# Patient Record
Sex: Female | Born: 1980
Health system: Southern US, Community
[De-identification: ages and names within clinical notes are randomized; demographics above are authoritative.]

## PROBLEM LIST (undated history)

## (undated) DIAGNOSIS — O24419 Gestational diabetes mellitus in pregnancy, unspecified control: Secondary | ICD-10-CM

## (undated) DIAGNOSIS — K589 Irritable bowel syndrome without diarrhea: Secondary | ICD-10-CM

## (undated) DIAGNOSIS — F419 Anxiety disorder, unspecified: Secondary | ICD-10-CM

## (undated) DIAGNOSIS — R87629 Unspecified abnormal cytological findings in specimens from vagina: Secondary | ICD-10-CM

## (undated) DIAGNOSIS — O43129 Velamentous insertion of umbilical cord, unspecified trimester: Secondary | ICD-10-CM

## (undated) DIAGNOSIS — J45909 Unspecified asthma, uncomplicated: Secondary | ICD-10-CM

## (undated) DIAGNOSIS — O26649 Intrahepatic cholestasis of pregnancy, unspecified trimester: Secondary | ICD-10-CM

## (undated) DIAGNOSIS — K831 Obstruction of bile duct: Secondary | ICD-10-CM

## (undated) DIAGNOSIS — F329 Major depressive disorder, single episode, unspecified: Secondary | ICD-10-CM

## (undated) DIAGNOSIS — F32A Depression, unspecified: Secondary | ICD-10-CM

## (undated) DIAGNOSIS — O26619 Liver and biliary tract disorders in pregnancy, unspecified trimester: Secondary | ICD-10-CM

## (undated) HISTORY — DX: Intrahepatic cholestasis of pregnancy, unspecified trimester: O26.649

## (undated) HISTORY — DX: Obstruction of bile duct: K83.1

## (undated) HISTORY — DX: Liver and biliary tract disorders in pregnancy, unspecified trimester: O26.619

## (undated) HISTORY — DX: Unspecified abnormal cytological findings in specimens from vagina: R87.629

## (undated) HISTORY — DX: Depression, unspecified: F32.A

## (undated) HISTORY — PX: ABDOMINOPLASTY: SUR9

## (undated) HISTORY — DX: Anxiety disorder, unspecified: F41.9

## (undated) HISTORY — DX: Irritable bowel syndrome, unspecified: K58.9

## (undated) HISTORY — DX: Major depressive disorder, single episode, unspecified: F32.9

## (undated) HISTORY — DX: Unspecified asthma, uncomplicated: J45.909

---

## 2014-01-09 ENCOUNTER — Emergency Department (HOSPITAL_BASED_OUTPATIENT_CLINIC_OR_DEPARTMENT_OTHER): Payer: BC Managed Care – PPO

## 2014-01-09 ENCOUNTER — Emergency Department (HOSPITAL_BASED_OUTPATIENT_CLINIC_OR_DEPARTMENT_OTHER)
Admission: EM | Admit: 2014-01-09 | Discharge: 2014-01-09 | Disposition: A | Discharge status: Home / Self Care | Payer: BC Managed Care – PPO | Attending: Emergency Medicine | Admitting: Emergency Medicine

## 2014-01-09 ENCOUNTER — Encounter (HOSPITAL_BASED_OUTPATIENT_CLINIC_OR_DEPARTMENT_OTHER): Payer: Self-pay | Admitting: Emergency Medicine

## 2014-01-09 DIAGNOSIS — Y92410 Unspecified street and highway as the place of occurrence of the external cause: Secondary | ICD-10-CM | POA: Insufficient documentation

## 2014-01-09 DIAGNOSIS — S99929A Unspecified injury of unspecified foot, initial encounter: Secondary | ICD-10-CM

## 2014-01-09 DIAGNOSIS — Z880 Allergy status to penicillin: Secondary | ICD-10-CM | POA: Insufficient documentation

## 2014-01-09 DIAGNOSIS — S99919A Unspecified injury of unspecified ankle, initial encounter: Secondary | ICD-10-CM

## 2014-01-09 DIAGNOSIS — S8990XA Unspecified injury of unspecified lower leg, initial encounter: Secondary | ICD-10-CM | POA: Insufficient documentation

## 2014-01-09 DIAGNOSIS — S199XXA Unspecified injury of neck, initial encounter: Principal | ICD-10-CM

## 2014-01-09 DIAGNOSIS — Z3202 Encounter for pregnancy test, result negative: Secondary | ICD-10-CM | POA: Insufficient documentation

## 2014-01-09 DIAGNOSIS — S3981XA Other specified injuries of abdomen, initial encounter: Secondary | ICD-10-CM | POA: Insufficient documentation

## 2014-01-09 DIAGNOSIS — S0993XA Unspecified injury of face, initial encounter: Secondary | ICD-10-CM | POA: Insufficient documentation

## 2014-01-09 DIAGNOSIS — Y9389 Activity, other specified: Secondary | ICD-10-CM | POA: Insufficient documentation

## 2014-01-09 LAB — PREGNANCY, URINE: PREG TEST UR: NEGATIVE

## 2014-01-09 MED ORDER — IBUPROFEN 600 MG PO TABS: 600.0000 mg | ORAL_TABLET | Freq: Three times a day (TID) | ORAL | Status: AC

## 2014-01-09 MED ORDER — IBUPROFEN 800 MG PO TABS
800.0000 mg | ORAL_TABLET | Freq: Once | ORAL | Status: AC
  Administered 2014-01-09: 800 mg via ORAL
  Filled 2014-01-09: qty 1

## 2014-01-09 MED ORDER — DIAZEPAM 5 MG PO TABS: 5.0000 mg | ORAL_TABLET | Freq: Two times a day (BID) | ORAL | Status: DC
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
## 2014-02-12 LAB — OB RESULTS CONSOLE ABO/RH: RH Type: POSITIVE

## 2014-02-12 LAB — OB RESULTS CONSOLE GC/CHLAMYDIA
Chlamydia: NEGATIVE
Gonorrhea: NEGATIVE

## 2014-02-12 LAB — OB RESULTS CONSOLE ANTIBODY SCREEN: Antibody Screen: NEGATIVE

## 2014-02-12 LAB — OB RESULTS CONSOLE RPR: RPR: NONREACTIVE

## 2014-02-12 LAB — OB RESULTS CONSOLE HEPATITIS B SURFACE ANTIGEN: HEP B S AG: NEGATIVE

## 2014-02-12 LAB — OB RESULTS CONSOLE HIV ANTIBODY (ROUTINE TESTING): HIV: NONREACTIVE

## 2014-02-12 LAB — OB RESULTS CONSOLE RUBELLA ANTIBODY, IGM: Rubella: IMMUNE

## 2014-07-13 ENCOUNTER — Other Ambulatory Visit: Payer: Self-pay

## 2014-07-22 ENCOUNTER — Other Ambulatory Visit (HOSPITAL_COMMUNITY): Payer: Self-pay | Admitting: Obstetrics & Gynecology

## 2014-07-22 DIAGNOSIS — K831 Obstruction of bile duct: Secondary | ICD-10-CM

## 2014-07-22 DIAGNOSIS — O26612 Liver and biliary tract disorders in pregnancy, second trimester: Principal | ICD-10-CM

## 2014-07-30 ENCOUNTER — Ambulatory Visit (HOSPITAL_COMMUNITY)
Admission: RE | Admit: 2014-07-30 | Discharge: 2014-07-30 | Disposition: A | Discharge status: Home / Self Care | Payer: BC Managed Care – PPO | Source: Ambulatory Visit | Attending: Obstetrics & Gynecology | Admitting: Obstetrics & Gynecology

## 2014-07-30 ENCOUNTER — Encounter (HOSPITAL_COMMUNITY): Payer: Self-pay

## 2014-07-30 VITALS — BP 134/78 | HR 101 | Wt 186.2 lb

## 2014-07-30 DIAGNOSIS — K831 Obstruction of bile duct: Secondary | ICD-10-CM

## 2014-07-30 DIAGNOSIS — K838 Other specified diseases of biliary tract: Secondary | ICD-10-CM | POA: Insufficient documentation

## 2014-07-30 DIAGNOSIS — O26612 Liver and biliary tract disorders in pregnancy, second trimester: Secondary | ICD-10-CM

## 2014-07-30 DIAGNOSIS — O26613 Liver and biliary tract disorders in pregnancy, third trimester: Principal | ICD-10-CM

## 2014-07-30 DIAGNOSIS — O26643 Intrahepatic cholestasis of pregnancy, third trimester: Secondary | ICD-10-CM

## 2014-07-30 DIAGNOSIS — O26619 Liver and biliary tract disorders in pregnancy, unspecified trimester: Secondary | ICD-10-CM | POA: Insufficient documentation

## 2014-07-30 NOTE — Consult Note (Signed)
Maternal Fetal Medicine Consultation  Requesting Provider(s): Ema Alger SimonsWakuru Kulwa, MD  Primary OB: Reason for consultation: Cholestasis of pregnancy  HPI: Holly Schwabnn Jackson is a 33 yo G1P0 currently at 31w 6d who was seen for consultation due to a recent diagnosis of cholestasis of pregnancy.  The patient reports that she developed pruritis on the soles and palms of her feet that progressed to diffuse pruritis in early June.  Her total bile acids returned at 10 and a diagnosis of cholestasis of pregnancy was made.  She is currently on Ursodiol 500 mg BID and Phenergan.  She reports that her pruritis has improved with medications.  She is currently undergoing weekly BPPs.  She is otherwise without complaints.  OB History: OB History   Grav Para Term Preterm Abortions TAB SAB Ect Mult Living   1               PMH: neg  PSH: Abdominoplasty 2008  Meds: Ursodiol 500 mg BID, Phenergan 25 mg qhS prn, PNV  Allergies:  Allergies  Allergen Reactions  . Penicillins Shortness Of Breath  . Sulfa Antibiotics Rash   FH: denies birth defects or hereditary disorders  Soc: denies tobacco or ETOH use   Review of Systems: no vaginal bleeding or cramping/contractions, no LOF, no nausea/vomiting. All other systems reviewed and are negative.  PE:   Filed Vitals:   07/30/14 1347  BP: 134/78  Pulse: 101   A/P: 1) Single IUP at 31w 6d         2) Cholestasis of pregnancy - we had a brief discussion regarding this diagnosis.  While the symptoms are restricted to pregnancy, there is a high risk of recurrence in future pregnancies.  Cholestasis of pregnancy is associated with an increased risk of stillbirth.  Recommend: 1) Continue antepartum fetal testing as scheduled.  In general, would recommend 2x weekly NSTs with weekly AFIs after [redacted] weeks gestation. 2) If a recent ultrasound for growth has not been performed, would recommend that this be done at the next available opportunity. 3) Please check LFTs at  next clinic visit (cholestasis may be associated with elevated LFTs - this may be helpful to have an idea of the patient's baseline function).   4) Recommend delivery at [redacted] weeks gestation due to increased risk of IUFD with cholestasis or pregnancy.   Thank you for the opportunity to be a part of the care of Holly Jackson. Please contact our office if we can be of further assistance.   I spent approximately 30 minutes with this patient with over 50% of time spent in face-to-face counseling.  Alpha GulaPaul Sokhna Christoph, MD Maternal Fetal Medicine

## 2014-08-27 ENCOUNTER — Ambulatory Visit (HOSPITAL_COMMUNITY)
Admission: RE | Admit: 2014-08-27 | Discharge: 2014-08-27 | Disposition: A | Discharge status: Home / Self Care | Payer: BC Managed Care – PPO | Source: Ambulatory Visit | Attending: Obstetrics and Gynecology | Admitting: Obstetrics and Gynecology

## 2014-08-27 ENCOUNTER — Other Ambulatory Visit: Payer: Self-pay | Admitting: Obstetrics and Gynecology

## 2014-08-27 DIAGNOSIS — O26619 Liver and biliary tract disorders in pregnancy, unspecified trimester: Secondary | ICD-10-CM

## 2014-08-27 NOTE — Progress Notes (Signed)
Patient no showed for 9:00AM add on appt. Dr. Su Hilt was called to inform of patient no show status. KK 2:55PM

## 2014-09-14 ENCOUNTER — Telehealth (HOSPITAL_COMMUNITY): Payer: Self-pay | Admitting: *Deleted

## 2014-09-14 ENCOUNTER — Encounter (HOSPITAL_COMMUNITY): Payer: Self-pay | Admitting: *Deleted

## 2014-09-14 LAB — OB RESULTS CONSOLE GBS: STREP GROUP B AG: POSITIVE

## 2014-09-14 NOTE — Telephone Encounter (Signed)
Preadmission screen  

## 2014-09-17 DIAGNOSIS — F411 Generalized anxiety disorder: Secondary | ICD-10-CM | POA: Insufficient documentation

## 2014-09-17 DIAGNOSIS — E669 Obesity, unspecified: Secondary | ICD-10-CM | POA: Diagnosis present

## 2014-09-17 DIAGNOSIS — Z9889 Other specified postprocedural states: Secondary | ICD-10-CM | POA: Insufficient documentation

## 2014-09-17 DIAGNOSIS — K831 Obstruction of bile duct: Secondary | ICD-10-CM | POA: Diagnosis present

## 2014-09-17 DIAGNOSIS — O26619 Liver and biliary tract disorders in pregnancy, unspecified trimester: Secondary | ICD-10-CM

## 2014-09-17 DIAGNOSIS — O99820 Streptococcus B carrier state complicating pregnancy: Secondary | ICD-10-CM

## 2014-09-20 ENCOUNTER — Inpatient Hospital Stay (HOSPITAL_COMMUNITY)
Admission: RE | Admit: 2014-09-20 | Discharge: 2014-09-24 | DRG: 765 | Disposition: A | Discharge status: Home / Self Care | Payer: BC Managed Care – PPO | Source: Ambulatory Visit | Attending: Obstetrics and Gynecology | Admitting: Obstetrics and Gynecology

## 2014-09-20 ENCOUNTER — Encounter (HOSPITAL_COMMUNITY): Payer: Self-pay

## 2014-09-20 VITALS — BP 107/70 | HR 97 | Temp 98.3°F | Resp 18 | Ht 62.0 in | Wt 190.0 lb

## 2014-09-20 DIAGNOSIS — O99824 Streptococcus B carrier state complicating childbirth: Secondary | ICD-10-CM | POA: Diagnosis present

## 2014-09-20 DIAGNOSIS — F411 Generalized anxiety disorder: Secondary | ICD-10-CM | POA: Diagnosis present

## 2014-09-20 DIAGNOSIS — O2662 Liver and biliary tract disorders in childbirth: Secondary | ICD-10-CM | POA: Diagnosis present

## 2014-09-20 DIAGNOSIS — D6489 Other specified anemias: Secondary | ICD-10-CM | POA: Diagnosis present

## 2014-09-20 DIAGNOSIS — K831 Obstruction of bile duct: Secondary | ICD-10-CM | POA: Diagnosis present

## 2014-09-20 DIAGNOSIS — O34219 Maternal care for unspecified type scar from previous cesarean delivery: Secondary | ICD-10-CM

## 2014-09-20 DIAGNOSIS — O99820 Streptococcus B carrier state complicating pregnancy: Secondary | ICD-10-CM

## 2014-09-20 DIAGNOSIS — O26619 Liver and biliary tract disorders in pregnancy, unspecified trimester: Secondary | ICD-10-CM

## 2014-09-20 DIAGNOSIS — Z9889 Other specified postprocedural states: Secondary | ICD-10-CM

## 2014-09-20 DIAGNOSIS — O26613 Liver and biliary tract disorders in pregnancy, third trimester: Secondary | ICD-10-CM | POA: Diagnosis present

## 2014-09-20 DIAGNOSIS — Z3A37 37 weeks gestation of pregnancy: Secondary | ICD-10-CM | POA: Diagnosis present

## 2014-09-20 DIAGNOSIS — O99343 Other mental disorders complicating pregnancy, third trimester: Secondary | ICD-10-CM | POA: Diagnosis present

## 2014-09-20 DIAGNOSIS — O9902 Anemia complicating childbirth: Secondary | ICD-10-CM | POA: Diagnosis present

## 2014-09-20 DIAGNOSIS — Z880 Allergy status to penicillin: Secondary | ICD-10-CM

## 2014-09-20 DIAGNOSIS — Z882 Allergy status to sulfonamides status: Secondary | ICD-10-CM | POA: Diagnosis not present

## 2014-09-20 DIAGNOSIS — E669 Obesity, unspecified: Secondary | ICD-10-CM | POA: Diagnosis present

## 2014-09-20 DIAGNOSIS — Z98891 History of uterine scar from previous surgery: Secondary | ICD-10-CM

## 2014-09-20 LAB — CBC
HEMATOCRIT: 39.6 % (ref 36.0–46.0)
HEMOGLOBIN: 13.4 g/dL (ref 12.0–15.0)
MCH: 26.7 pg (ref 26.0–34.0)
MCHC: 33.8 g/dL (ref 30.0–36.0)
MCV: 78.9 fL (ref 78.0–100.0)
Platelets: 236 10*3/uL (ref 150–400)
RBC: 5.02 MIL/uL (ref 3.87–5.11)
RDW: 15 % (ref 11.5–15.5)
WBC: 8.5 10*3/uL (ref 4.0–10.5)

## 2014-09-20 LAB — TYPE AND SCREEN
ABO/RH(D): O POS
ANTIBODY SCREEN: NEGATIVE

## 2014-09-20 MED ORDER — LIDOCAINE HCL (PF) 1 % IJ SOLN: 30.0000 mL | INTRAMUSCULAR | Status: DC | PRN

## 2014-09-20 MED ORDER — LACTATED RINGERS IV SOLN
INTRAVENOUS | Status: DC
  Administered 2014-09-20 – 2014-09-21 (×7): via INTRAVENOUS

## 2014-09-20 MED ORDER — ACETAMINOPHEN 325 MG PO TABS
650.0000 mg | ORAL_TABLET | ORAL | Status: DC | PRN
  Administered 2014-09-21: 650 mg via ORAL
  Filled 2014-09-20: qty 2

## 2014-09-20 MED ORDER — ZOLPIDEM TARTRATE 5 MG PO TABS
5.0000 mg | ORAL_TABLET | Freq: Every evening | ORAL | Status: DC | PRN
  Administered 2014-09-21: 5 mg via ORAL
  Filled 2014-09-20: qty 1

## 2014-09-20 MED ORDER — OXYTOCIN 40 UNITS IN LACTATED RINGERS INFUSION - SIMPLE MED: 62.5000 mL/h | INTRAVENOUS | Status: DC

## 2014-09-20 MED ORDER — ONDANSETRON HCL 4 MG/2ML IJ SOLN
4.0000 mg | Freq: Four times a day (QID) | INTRAMUSCULAR | Status: DC | PRN
  Filled 2014-09-20: qty 2

## 2014-09-20 MED ORDER — CITRIC ACID-SODIUM CITRATE 334-500 MG/5ML PO SOLN
30.0000 mL | ORAL | Status: DC | PRN
  Administered 2014-09-21: 30 mL via ORAL
  Filled 2014-09-20: qty 15

## 2014-09-20 MED ORDER — NALBUPHINE HCL 10 MG/ML IJ SOLN
10.0000 mg | INTRAMUSCULAR | Status: DC | PRN
  Administered 2014-09-21 (×3): 10 mg via INTRAVENOUS
  Filled 2014-09-20 (×4): qty 1

## 2014-09-20 MED ORDER — OXYCODONE-ACETAMINOPHEN 5-325 MG PO TABS: 2.0000 | ORAL_TABLET | ORAL | Status: DC | PRN

## 2014-09-20 MED ORDER — MISOPROSTOL 25 MCG QUARTER TABLET
25.0000 ug | ORAL_TABLET | ORAL | Status: DC | PRN
  Administered 2014-09-20 – 2014-09-21 (×4): 25 ug via VAGINAL
  Filled 2014-09-20 (×4): qty 0.25

## 2014-09-20 MED ORDER — TERBUTALINE SULFATE 1 MG/ML IJ SOLN: 0.2500 mg | Freq: Once | INTRAMUSCULAR | Status: AC | PRN

## 2014-09-20 MED ORDER — OXYTOCIN BOLUS FROM INFUSION: 500.0000 mL | INTRAVENOUS | Status: DC

## 2014-09-20 MED ORDER — LACTATED RINGERS IV SOLN
500.0000 mL | INTRAVENOUS | Status: DC | PRN
  Administered 2014-09-21: 500 mL via INTRAVENOUS

## 2014-09-20 MED ORDER — CLINDAMYCIN PHOSPHATE 900 MG/50ML IV SOLN
900.0000 mg | Freq: Three times a day (TID) | INTRAVENOUS | Status: DC
  Administered 2014-09-21: 900 mg via INTRAVENOUS
  Filled 2014-09-20 (×3): qty 50

## 2014-09-20 MED ORDER — OXYCODONE-ACETAMINOPHEN 5-325 MG PO TABS: 1.0000 | ORAL_TABLET | ORAL | Status: DC | PRN

## 2014-09-20 NOTE — Progress Notes (Signed)
Holly Jackson, [id number]   Subjective -1950: Patient reports she has not eaten and requests to eat before induction started.  Will reassess at 2100 -Patient with questions regarding induction process, states she has birthing plan she would like to review.    Objective Filed Vitals:   09/20/14 2017  Pulse: 115  Temp: 97.9 F (36.6 C)  Resp: 18        Physical Exam General appearance: alert, cooperative and no distress  WGN:[id number]: Closed Effacement (%): 50 Station: -2 Presentation: Vertex Exam by:: Gerrit Heck  Pelvis:Adequate  FHR: 145 bpm, Mod Var, -Decels, +Accels UC:  Occasional, palpates mild  Induction/Augmentation Agent: Pitocin: None Cytotec Dose: 1st Membranes: Intact  Assessment IUP at 37 wks Cat I FT Bishop Score: 5 Cholestasis GBS Positive   Plan -Discussed induction process including cytotec, pain mgmt, sleep aides, and pitocin -Patient requests saline lock, discussed need for immediate access to fluids in cases of fetal distress, uterine tachysystole, etc.  Patient verbalized understanding -Discussed POC for tonight and variations  -Patient requests sleep aide, Ambien ordered -Continue other mgmt as ordered  Antiono Ettinger LYNN, CNM  9:48 PM

## 2014-09-20 NOTE — H&P (Signed)
Holly Jackson is a 33 y.o. female, G1P0 at 37 weeks, presenting for induction of labor for cholestasis of pregnancy. Denies leak ing or bleeding, reports +FM. Cervix was closed, 50% on last exam 09/14/14. Due to dx of cholestasis, MFM recommended induction at 37 weeks, due to increased risk of IUFD with cholestasis.   Patient Active Problem List   Diagnosis Date Noted  . Cholestasis of pregnancy 09/17/2014  . GBS (group B Streptococcus carrier), +RV culture, currently pregnant 09/17/2014  . Allergy to penicillin 09/17/2014  . Allergy to sulfa drugs 09/17/2014  . Obesity, unspecified 09/17/2014  . Generalized anxiety disorder 09/17/2014  . Hx of abdominoplasty 09/17/2014    History of present pregnancy: Patient entered care at 10 2/7 weeks.  EDC of 10/11/14 was established by [place] at 8 weeks (with change from LMP EDC of 09/25/14).  Anatomy scan: 18 weeks, with limited anatomy assessment and an anterior placenta.  Additional [place] evaluations:  23 3/7 weeks, for anatomy f/u: EFw 1+4, 48%ile, cervix 3.14, cervix closed. Still unable to complete cardiac anatomy.  27 weeks, for anatomy f/u: EFW 2+5. 46%ile, cervix 3.42, AFI 60%ile, 16.07. Still unable to see ductal arch well due to fetal position.  Started weekly BPPs at 32 weeks due to cholestasis of pregnancy.  -32 1/7 weeks: EFW 4+11, 75%ile, AFI 16, 55%ile, cervix closed, BPP 8/8.  -34 1/7 weeks: EFW 5+6, 54%ile, AFI 18.48, 65%ile.  -35 weeks: EFW 5+15, 60%ile, AFI 20.7.  -36 1/7 weeks: AFI 16.88, 60%ile, vtx.  Significant prenatal events: EDC changed by [place] at 8 weeks to 10/11/14. Had nausea during early pregnancy, with Diclegis used. Declined genetic testing. Noted increased itching at 27 weeks, with bile salts noted to be 10--dx with cholestasis of pregnancy. Placed on ursodiol and benadryl at 28 weeks. Began weekly BPPs at 28 weeks, then added twice weekly NSTs at 32 weeks. MFM referral made for 08/07/14, with recommendation from MFM for delivery  at 37 weeks. Bile acids 18 on 8 13/15. Also used Phenergan for sedation and insomnia, since Vistaril, Benadryl, and Tylenol were ineffective. Did have carpal tunnel sx during pregnancy. Fetal growth and assessments remained reassuring during pregnancy. Added cholestyramine at approx 33 week as additional med due to increased itching. Itching improved by 35 weeks. GBS positive on 09/14/14, but no sensitivities done. Induction planned at 37 weeks. Cervix closed, 50%, vtx, on 09/14/14. Bile acids 17 on 09/06/14, with normal LFTs.  Last evaluation: 09/16/14, normotensive, weight 187. BPP 8/8.    OB History   Grav Para Term Preterm Abortions TAB SAB Ect Mult Living   1              Past Medical History  Diagnosis Date  . Cholestasis of pregnancy   . Anxiety   . Vaginal Pap smear, abnormal     pos HPV   Past Surgical History  Procedure Laterality Date  . Abdominoplasty     Family History: family history includes Miscarriages / Stillbirths in her sister. Social History:  reports that she has never smoked. She has never used smokeless tobacco. She reports that she does not drink alcohol or use illicit drugs. Patient is from [place], married to Geoffry Paradise, with a post-graduate education, employed in Audiological scientist. She is of the [place] faith.   Prenatal Transfer Tool  Maternal Diabetes: No Genetic Screening: Normal Maternal Ultrasounds/Referrals: Normal Fetal Ultrasounds or other Referrals:  None Maternal Substance Abuse:  No Significant Maternal Medications:  Meds include: Other: Ursodiol and Cholestryamine for  Cholestasis Significant Maternal Lab Results: Lab values include: Group B Strep positive, elevated bile acids    ROS:  Reports +FM, Occassional Ctx, -LoF, -VB  Allergies  Allergen Reactions  . Penicillins Shortness Of Breath  . Sulfa Antibiotics Rash       Pulse 115, temperature 97.9 F (36.6 C), temperature source Oral, resp. rate 18, height  (1.575 m), weight 190 lb  (86.183 kg), last menstrual period 12/19/2013.  Chest clear  Heart RRR without murmur  Abd gravid, NT, FH 37 weeks  Pelvic: closed, 50%, vtx, on office exam 09/14/14.  Ext: DTR trace edema.  FHR: NST reassuring on 9/24, with BPP 8/8.  UCs: Occasional, mild   Prenatal labs: ABO, Rh: O/Positive/-- (02/20 0000) Antibody: Negative (02/20 0000) Rubella:   Immune RPR: Nonreactive (02/20 0000)  HBsAg: Negative (02/20 0000)  HIV: Non-reactive (02/20 0000)  GBS: Positive (09/22 0000) Sickle cell/Hgb electrophoresis:  AA Pap:  03/01/2014 WNL GC:  Negative 2/202015  Chlamydia:  Negative 02/12/2014 Genetic screenings:  Normal Quad Screen Glucola:  WNL Other:  Bile Acids 10 on 07/13/2014, 18 on 08/05/2014, 17 on 08/26/2014 LFTs WNL 08/26/2014 Hgb 13.1 on 02/12/14, 12.3 on 07/13/14.    Assessment IIUP at 37 weeks  Cholestasis of pregnancy  Cat I FT PCN allergic  GBS positive--no sensitivities done  Anxiety  Hx abdominoplasty   Plan: Admit to [facility] per consult with Dr. Kathie Rhodes. [place] Routine Labor and Delivery Orders per [facility] Protocol Routine Induction/Augmentation Orders Cervical ripening with Cytotec, foley bulb, and/or pitocin as appropriate GBS prophylaxis with active labor or ROM Pain medication prn  Noga Fogg LYNNCNM, MSN 09/20/2014, 8:26 PM

## 2014-09-21 ENCOUNTER — Encounter (HOSPITAL_COMMUNITY): Payer: BC Managed Care – PPO | Admitting: Anesthesiology

## 2014-09-21 ENCOUNTER — Encounter (HOSPITAL_COMMUNITY)
Admission: RE | Disposition: A | Discharge status: Home / Self Care | Payer: Self-pay | Source: Ambulatory Visit | Attending: Obstetrics and Gynecology

## 2014-09-21 ENCOUNTER — Inpatient Hospital Stay (HOSPITAL_COMMUNITY): Payer: BC Managed Care – PPO | Admitting: Anesthesiology

## 2014-09-21 ENCOUNTER — Encounter (HOSPITAL_COMMUNITY): Payer: Self-pay

## 2014-09-21 LAB — COMPREHENSIVE METABOLIC PANEL
ALBUMIN: 2.8 g/dL — AB (ref 3.5–5.2)
ALT: 9 U/L (ref 0–35)
ANION GAP: 14 (ref 5–15)
AST: 17 U/L (ref 0–37)
Alkaline Phosphatase: 140 U/L — ABNORMAL HIGH (ref 39–117)
BUN: 6 mg/dL (ref 6–23)
CALCIUM: 8.9 mg/dL (ref 8.4–10.5)
CO2: 20 mEq/L (ref 19–32)
Chloride: 105 mEq/L (ref 96–112)
Creatinine, Ser: 0.43 mg/dL — ABNORMAL LOW (ref 0.50–1.10)
GFR calc Af Amer: >90 mL/min (ref >90)
GFR calc non Af Amer: >90 mL/min (ref >90)
Glucose, Bld: 95 mg/dL (ref 70–99)
POTASSIUM: 4 meq/L (ref 3.7–5.3)
Sodium: 139 mEq/L (ref 137–147)
TOTAL PROTEIN: 6.8 g/dL (ref 6.0–8.3)
Total Bilirubin: <0.2 mg/dL — ABNORMAL LOW (ref 0.3–1.2)

## 2014-09-21 LAB — ABO/RH: ABO/RH(D): O POS

## 2014-09-21 LAB — RPR

## 2014-09-21 SURGERY — Surgical Case
Anesthesia: Epidural

## 2014-09-21 MED ORDER — PROMETHAZINE HCL 25 MG/ML IJ SOLN
25.0000 mg | Freq: Four times a day (QID) | INTRAMUSCULAR | Status: DC | PRN
  Administered 2014-09-21: 25 mg via INTRAVENOUS
  Filled 2014-09-21: qty 1

## 2014-09-21 MED ORDER — LACTATED RINGERS IV SOLN: 500.0000 mL | Freq: Once | INTRAVENOUS | Status: DC

## 2014-09-21 MED ORDER — OXYTOCIN 10 UNIT/ML IJ SOLN
INTRAMUSCULAR | Status: AC
  Filled 2014-09-21: qty 4

## 2014-09-21 MED ORDER — FENTANYL 2.5 MCG/ML BUPIVACAINE 1/10 % EPIDURAL INFUSION (WH - ANES)
14.0000 mL/h | INTRAMUSCULAR | Status: DC | PRN
  Filled 2014-09-21: qty 125

## 2014-09-21 MED ORDER — PHENYLEPHRINE 40 MCG/ML (10ML) SYRINGE FOR IV PUSH (FOR BLOOD PRESSURE SUPPORT): 80.0000 ug | PREFILLED_SYRINGE | INTRAVENOUS | Status: DC | PRN

## 2014-09-21 MED ORDER — EPHEDRINE 5 MG/ML INJ: 10.0000 mg | INTRAVENOUS | Status: DC | PRN

## 2014-09-21 MED ORDER — PROMETHAZINE HCL 25 MG/ML IJ SOLN: 6.2500 mg | INTRAMUSCULAR | Status: DC | PRN

## 2014-09-21 MED ORDER — MORPHINE SULFATE (PF) 0.5 MG/ML IJ SOLN
INTRAMUSCULAR | Status: DC | PRN
  Administered 2014-09-21: 2000 ug via INTRAVENOUS
  Administered 2014-09-21: 3000 ug via EPIDURAL

## 2014-09-21 MED ORDER — OXYTOCIN 10 UNIT/ML IJ SOLN
40.0000 [IU] | INTRAVENOUS | Status: DC | PRN
  Administered 2014-09-21: 40 [IU] via INTRAVENOUS

## 2014-09-21 MED ORDER — LIDOCAINE-EPINEPHRINE (PF) 2 %-1:200000 IJ SOLN
INTRAMUSCULAR | Status: DC | PRN
  Administered 2014-09-21: 10 mL via EPIDURAL
  Administered 2014-09-21: 5 mL via EPIDURAL

## 2014-09-21 MED ORDER — ONDANSETRON HCL 4 MG/2ML IJ SOLN
INTRAMUSCULAR | Status: DC | PRN
  Administered 2014-09-21: 4 mg via INTRAVENOUS

## 2014-09-21 MED ORDER — KETOROLAC TROMETHAMINE 30 MG/ML IJ SOLN
INTRAMUSCULAR | Status: AC
  Filled 2014-09-21: qty 1

## 2014-09-21 MED ORDER — SCOPOLAMINE 1 MG/3DAYS TD PT72
MEDICATED_PATCH | TRANSDERMAL | Status: AC
  Filled 2014-09-21: qty 1

## 2014-09-21 MED ORDER — KETOROLAC TROMETHAMINE 30 MG/ML IJ SOLN: 15.0000 mg | Freq: Once | INTRAMUSCULAR | Status: AC | PRN

## 2014-09-21 MED ORDER — MEPERIDINE HCL 25 MG/ML IJ SOLN: 6.2500 mg | INTRAMUSCULAR | Status: DC | PRN

## 2014-09-21 MED ORDER — ONDANSETRON HCL 4 MG/2ML IJ SOLN
INTRAMUSCULAR | Status: AC
Start: 2014-09-21 — End: 2014-09-21
  Filled 2014-09-21: qty 2

## 2014-09-21 MED ORDER — KETOROLAC TROMETHAMINE 30 MG/ML IJ SOLN
30.0000 mg | Freq: Four times a day (QID) | INTRAMUSCULAR | Status: AC | PRN
  Administered 2014-09-21 – 2014-09-22 (×2): 30 mg via INTRAVENOUS
  Filled 2014-09-21: qty 1

## 2014-09-21 MED ORDER — HYDROMORPHONE HCL 1 MG/ML IJ SOLN
INTRAMUSCULAR | Status: AC
  Filled 2014-09-21: qty 1

## 2014-09-21 MED ORDER — DIPHENHYDRAMINE HCL 50 MG/ML IJ SOLN: 12.5000 mg | INTRAMUSCULAR | Status: DC | PRN

## 2014-09-21 MED ORDER — KETOROLAC TROMETHAMINE 30 MG/ML IJ SOLN: 30.0000 mg | Freq: Four times a day (QID) | INTRAMUSCULAR | Status: AC | PRN

## 2014-09-21 MED ORDER — URSODIOL 300 MG PO CAPS
300.0000 mg | ORAL_CAPSULE | Freq: Three times a day (TID) | ORAL | Status: DC
  Filled 2014-09-21 (×8): qty 1

## 2014-09-21 MED ORDER — SCOPOLAMINE 1 MG/3DAYS TD PT72
1.0000 | MEDICATED_PATCH | Freq: Once | TRANSDERMAL | Status: DC
  Administered 2014-09-21: 1.5 mg via TRANSDERMAL

## 2014-09-21 MED ORDER — TERBUTALINE SULFATE 1 MG/ML IJ SOLN
0.2500 mg | Freq: Once | INTRAMUSCULAR | Status: AC
  Administered 2014-09-21: 0.25 mg via SUBCUTANEOUS

## 2014-09-21 MED ORDER — CLINDAMYCIN PHOSPHATE 900 MG/50ML IV SOLN
900.0000 mg | INTRAVENOUS | Status: AC
  Administered 2014-09-21: 900 mg via INTRAVENOUS
  Filled 2014-09-21: qty 50

## 2014-09-21 MED ORDER — MEPERIDINE HCL 25 MG/ML IJ SOLN
INTRAMUSCULAR | Status: DC | PRN
  Administered 2014-09-21: 25 mg via INTRAVENOUS

## 2014-09-21 MED ORDER — PHENYLEPHRINE 40 MCG/ML (10ML) SYRINGE FOR IV PUSH (FOR BLOOD PRESSURE SUPPORT)
PREFILLED_SYRINGE | INTRAVENOUS | Status: AC
  Filled 2014-09-21: qty 10

## 2014-09-21 MED ORDER — VANCOMYCIN HCL IN DEXTROSE 1-5 GM/200ML-% IV SOLN
1000.0000 mg | Freq: Two times a day (BID) | INTRAVENOUS | Status: DC
  Administered 2014-09-21: 1000 mg via INTRAVENOUS
  Filled 2014-09-21 (×2): qty 200

## 2014-09-21 MED ORDER — HYDROMORPHONE HCL 1 MG/ML IJ SOLN
0.2500 mg | INTRAMUSCULAR | Status: DC | PRN
  Administered 2014-09-21 (×4): 0.5 mg via INTRAVENOUS

## 2014-09-21 MED ORDER — LIDOCAINE-EPINEPHRINE (PF) 2 %-1:200000 IJ SOLN
INTRAMUSCULAR | Status: AC
  Filled 2014-09-21: qty 20

## 2014-09-21 MED ORDER — SODIUM CHLORIDE 0.9 % IR SOLN
Status: DC | PRN
  Administered 2014-09-21: 1

## 2014-09-21 MED ORDER — MORPHINE SULFATE 0.5 MG/ML IJ SOLN
INTRAMUSCULAR | Status: AC
Start: 2014-09-21 — End: 2014-09-21
  Filled 2014-09-21: qty 10

## 2014-09-21 MED ORDER — SODIUM BICARBONATE 8.4 % IV SOLN
INTRAVENOUS | Status: AC
  Filled 2014-09-21: qty 50

## 2014-09-21 MED ORDER — PHENYLEPHRINE HCL 10 MG/ML IJ SOLN
INTRAMUSCULAR | Status: DC | PRN
  Administered 2014-09-21: 80 ug via INTRAVENOUS
  Administered 2014-09-21: 120 ug via INTRAVENOUS
  Administered 2014-09-21: 80 ug via INTRAVENOUS

## 2014-09-21 MED ORDER — MEPERIDINE HCL 25 MG/ML IJ SOLN
INTRAMUSCULAR | Status: AC
Start: 2014-09-21 — End: 2014-09-21
  Filled 2014-09-21: qty 1

## 2014-09-21 MED ORDER — HYDROMORPHONE HCL 1 MG/ML IJ SOLN
INTRAMUSCULAR | Status: DC | PRN
  Administered 2014-09-21: 1 mg via INTRAVENOUS

## 2014-09-21 MED ORDER — TERBUTALINE SULFATE 1 MG/ML IJ SOLN
INTRAMUSCULAR | Status: AC
  Filled 2014-09-21: qty 1

## 2014-09-21 MED ORDER — PHENYLEPHRINE 40 MCG/ML (10ML) SYRINGE FOR IV PUSH (FOR BLOOD PRESSURE SUPPORT)
80.0000 ug | PREFILLED_SYRINGE | INTRAVENOUS | Status: DC | PRN
  Filled 2014-09-21: qty 10

## 2014-09-21 SURGICAL SUPPLY — 42 items
CLAMP CORD UMBIL (MISCELLANEOUS) IMPLANT
CLOTH BEACON ORANGE TIMEOUT ST (SAFETY) ×2 IMPLANT
CONTAINER PREFILL 10% NBF 15ML (MISCELLANEOUS) IMPLANT
COVER LIGHT HANDLE  1/PK (MISCELLANEOUS) ×2
COVER LIGHT HANDLE 1/PK (MISCELLANEOUS) ×2 IMPLANT
DERMABOND ADHESIVE PROPEN (GAUZE/BANDAGES/DRESSINGS) ×1
DERMABOND ADVANCED (GAUZE/BANDAGES/DRESSINGS)
DERMABOND ADVANCED .7 DNX12 (GAUZE/BANDAGES/DRESSINGS) IMPLANT
DERMABOND ADVANCED .7 DNX6 (GAUZE/BANDAGES/DRESSINGS) ×1 IMPLANT
DRAPE SHEET LG 3/4 BI-LAMINATE (DRAPES) IMPLANT
DRSG OPSITE POSTOP 4X10 (GAUZE/BANDAGES/DRESSINGS) ×2 IMPLANT
DURAPREP 26ML APPLICATOR (WOUND CARE) ×2 IMPLANT
ELECT REM PT RETURN 9FT ADLT (ELECTROSURGICAL) ×2
ELECTRODE REM PT RTRN 9FT ADLT (ELECTROSURGICAL) ×1 IMPLANT
EXTRACTOR VACUUM M CUP 4 TUBE (SUCTIONS) IMPLANT
GLOVE BIOGEL PI IND STRL 7.0 (GLOVE) ×1 IMPLANT
GLOVE BIOGEL PI INDICATOR 7.0 (GLOVE) ×1
GLOVE SURG SS PI 6.5 STRL IVOR (GLOVE) ×2 IMPLANT
GOWN STRL REUS W/TWL LRG LVL3 (GOWN DISPOSABLE) ×4 IMPLANT
KIT ABG SYR 3ML LUER SLIP (SYRINGE) IMPLANT
NEEDLE HYPO 25X5/8 SAFETYGLIDE (NEEDLE) IMPLANT
NS IRRIG 1000ML POUR BTL (IV SOLUTION) ×2 IMPLANT
PACK C SECTION WH (CUSTOM PROCEDURE TRAY) ×2 IMPLANT
PAD OB MATERNITY 4.3X12.25 (PERSONAL CARE ITEMS) ×2 IMPLANT
RTRCTR C-SECT PINK 25CM LRG (MISCELLANEOUS) ×2 IMPLANT
SUT CHROMIC 1 CTX 36 (SUTURE) ×2 IMPLANT
SUT CHROMIC 2 0 CT 1 (SUTURE) IMPLANT
SUT MNCRL AB 4-0 PS2 18 (SUTURE) ×2 IMPLANT
SUT MON AB 4-0 PS1 27 (SUTURE) ×2 IMPLANT
SUT PDS AB 1 CT  36 (SUTURE) ×1
SUT PDS AB 1 CT 36 (SUTURE) ×1 IMPLANT
SUT PLAIN 1 NONE 54 (SUTURE) IMPLANT
SUT PLAIN 2 0 (SUTURE)
SUT PLAIN 2 0 XLH (SUTURE) IMPLANT
SUT PLAIN ABS 2-0 CT1 27XMFL (SUTURE) IMPLANT
SUT VIC AB 0 CTX 36 (SUTURE) ×2
SUT VIC AB 0 CTX36XBRD ANBCTRL (SUTURE) ×2 IMPLANT
SUT VIC AB 1 CTX 36 (SUTURE) ×2
SUT VIC AB 1 CTX36XBRD ANBCTRL (SUTURE) ×2 IMPLANT
TOWEL OR 17X24 6PK STRL BLUE (TOWEL DISPOSABLE) ×2 IMPLANT
TRAY FOLEY CATH 14FR (SET/KITS/TRAYS/PACK) ×2 IMPLANT
WATER STERILE IRR 1000ML POUR (IV SOLUTION) ×2 IMPLANT

## 2014-09-21 NOTE — Op Note (Addendum)
Cesarean Section Procedure Note   Holly MoosGicira-Ammar, Khushi  09/21/2014  Indications: Abnormal fetal heart tracing, persistent category 2 fetal heart tracing: Fetal late decelerations, minimal fetal heart rate variability that continued despite intrauterine resuscitation efforts (intravenous fluid, positional changes, maternal oxygen use.)     Pre-operative Diagnosis:  1. Induction of labor at 4737 W 1 D EGA for cholestasis of pregnancy.  2. 4 cm dilation, remote from delivery.  3. Abnormal fetal heart tracing, persistent category 2 fetal heart tracing.     Post-operative Diagnosis: Same as above.    Surgeon: Surgeon(s) and Role:    * Gibson Lad Alger SimonsWakuru Amond Speranza, MD - Primary  Assistants: Gerrit HeckJessica Emly, CNM.   Anesthesia: Epidural.   Procedure Details:  The patient was seen in the labor room.  The risks, benefits, complications, treatment options, and expected outcomes were discussed with the patient. The patient concurred with the proposed plan, giving informed consent. Identified as Holly Jackson, Holly Jackson and the procedure verified as C-Section Delivery. A Time Out was held and the above information confirmed.   After induction of anesthesia, the patient was draped and prepped in the usual sterile manner. A transverse pfannenstiel incision was made over the old abdominoplasty incision and carried down through the subcutaneous tissue to the fascia. Fascial incision was made and extended transversely. The fascia was separated from the underlying rectus tissue superiorly and inferiorly. The peritoneum was identified and entered. Peritoneal incision was extended longitudinally. The Alexis retractor was placed in.  The utero-vesical peritoneal reflection was incised transversely and the bladder flap was bluntly freed from the lower uterine segment. A low transverse uterine incision was made. This was extended bilaterally with the bandage scissors.  Clear fluid was noted.  Fetal head was noted to be low in pelvis.  It  was raised up to uterine incision and when abdominal pressure was applied fetus moved to transverse position.  Fetus was then internally verted by pressing up on fetal back and when fetal head was at uterine incision again it was delivered with vacuum assistance, one pull, no pop off. Delivered from cephalic presentation was a viable Female with Apgar scores of 5 at one minute and 9 at five minutes. Cord ph was sent. The umbilical cord was clamped and cut, cord blood was obtained for evaluation. The placenta was removed Intact and appeared normal. The uterine cavity was cleaned with a lap.  The uterus was exteriorized.  The uterine outline, tubes and ovaries appeared normal. The uterine incision was closed in 2 layers, with second layer imbricating over the first one, with running locked sutures of 1-0Vicryl.   Hemostasis was observed. Uterus was returned into abdominal cavity.  Lavage was carried out until clear. The muscle was re-approximated with 2-0 chromic in interrupted stitches.  The fascia was then reapproximated with running sutures of 1 PDS.  The subcuticular closure was performed using 2-0plain gut. The skin was closed with 4-0 monocryl. Dermabond was applied over the incision.  Honey comb dressing was applied over the incision.     Instrument, sponge, and needle counts were correct prior the abdominal closure and were correct at the conclusion of the case.    Findings:  Normal uterus, tubes and fallopian tubes bilaterally.    Estimated Blood Loss: 700 cc.   Total IV Fluids: 2500 cc LR    Urine Output: 225 cc  Specimens: Placenta, cord Blood.   Complications: no complications  Disposition: PACU - hemodynamically stable.   Maternal Condition: stable   Baby condition /  location:  Nursery  Attending Attestation: I was present and scrubbed for the entire procedure.   Signed: Surgeon(s): Green Quincy Alger Simons, MD

## 2014-09-21 NOTE — Transfer of Care (Signed)
Immediate Anesthesia Transfer of Care Note  Patient: Holly Jackson  Procedure(s) Performed: Procedure(s): CESAREAN SECTION (N/A)  Patient Location: PACU  Anesthesia Type:Epidural  Level of Consciousness: awake, alert  and oriented  Airway & Oxygen Therapy: Patient Spontanous Breathing  Post-op Assessment: Report given to PACU RN and Post -op Vital signs reviewed and stable  Post vital signs: Reviewed and stable  Complications: No apparent anesthesia complications

## 2014-09-21 NOTE — Brief Op Note (Signed)
09/20/2014 - 09/21/2014  11:18 PM  PATIENT:  Holly Jackson  33 y.o. female  PRE-OPERATIVE DIAGNOSIS:  Abnormal fetal heart tracing, persistent category II fetal heart tracing.   POST-OPERATIVE DIAGNOSIS:  Primary Cesarean Section   PROCEDURE:  Procedure(s): CESAREAN SECTION (N/A)  SURGEON:  Surgeon(s) and Role:    * Vasti Yagi Alger SimonsWakuru Xylon Croom, MD - Primary  PHYSICIAN ASSISTANT:   ASSISTANTS: Gerrit HeckJessica Emly, CNM.   ANESTHESIA:   epidural  EBL:  Total I/O In: 2600 [I.V.:2600] Out: 1475 [Urine:775; Blood:700]  BLOOD ADMINISTERED:none  DRAINS: none   LOCAL MEDICATIONS USED:  NONE  SPECIMEN:  Source of Specimen:  Placenta, Cord gas.   DISPOSITION OF SPECIMEN:  PATHOLOGY  COUNTS:  YES  TOURNIQUET:  * No tourniquets in log *  DICTATION: .Note written in EPIC  PLAN OF CARE: Admit to inpatient   PATIENT DISPOSITION:  PACU - hemodynamically stable.   Delay start of Pharmacological VTE agent (>24hrs) due to surgical blood loss or risk of bleeding: not applicable

## 2014-09-21 NOTE — Anesthesia Procedure Notes (Signed)
Epidural Patient location during procedure: OB Start time: 09/21/2014 5:56 PM  Staffing Anesthesiologist: Zael Shuman Performed by: anesthesiologist   Preanesthetic Checklist Completed: patient identified, site marked, surgical consent, pre-op evaluation, timeout performed, IV checked, risks and benefits discussed and monitors and equipment checked  Epidural Patient position: sitting Prep: site prepped and draped and DuraPrep Patient monitoring: continuous pulse ox and blood pressure Approach: midline Location: L3-L4 Injection technique: LOR air  Needle:  Needle type: Tuohy  Needle gauge: 17 G Needle length: 9 cm and 9 Needle insertion depth: 5 cm cm Catheter type: closed end flexible Catheter size: 19 Gauge Catheter at skin depth: 10 cm Test dose: negative  Assessment Events: blood not aspirated, injection not painful, no injection resistance, negative IV test and no paresthesia  Additional Notes Discussed risk of headache, infection, bleeding, nerve injury and failed or incomplete block.  Patient voices understanding and wishes to proceed. Reason for block:procedure for pain

## 2014-09-21 NOTE — Progress Notes (Signed)
Holly Jackson MRN: [id number]  Subjective: -Report received, strip reviewed.  Non-reassuring. Patient s/p terbutaline.  In to assess patient who reports no pain. Dr. Carmela HurtE. Kulwa to be consulted.  Objective: BP 119/62  Pulse 114  Temp(Src) 98.4 F (36.9 C) (Oral)  Resp 18  Ht 5\' 2"  (1.575 m)  Wt 190 lb (86.183 kg)  BMI 34.74 kg/m2  SpO2 100%  LMP 12/19/2013 I/O last 3 completed shifts: In: -  Out: 2 [Stool:2] Total I/O In: -  Out: 550 [Urine:550] FHT: 155 bpm, Absent Var, -Decels, -Accels UC:   Occasional, mild SVE:   Deferred Foley Bulb in Place Membranes: SROM at 1415 Pitocin: None  Assessment:  IUP at 37.1wks Cat II FT Cholestasis GBS Positive  Plan: -IV Bolus, O2, position change -Dr. Carmela HurtE. Kulwa consulted and advised as below: -Remove foley bulb, perform scalp stimulation, call to reassess based upon findings -Discussed POC with patient as well as possibility of c/s due to fetal tracing, patient and husband verbalize understanding -Continue other mgmt as ordered  [facility] - FriscoEMLY, Shyra Emile LYNN,CNM, MSN 09/21/2014, 8:09 PM

## 2014-09-21 NOTE — Progress Notes (Signed)
Labor Progress  Subjective: No complaints, comfortable with epidural  Objective: BP 119/63  Pulse 111  Temp(Src) 97.9 F (36.6 C) (Oral)  Resp 20  Ht 5\' 2"  (1.575 m)  Wt 86.183 kg (190 lb)  BMI 34.74 kg/m2  SpO2 100%  LMP 12/19/2013 I/O last 3 completed shifts: In: -  Out: 2 [Stool:2] Total I/O In: -  Out: 550 [Urine:550] FHT: 160, no acces, minimum variability, late decels CTX:  regular, every 3 minutes Uterus gravid, soft non tender SVE:  Dilation: 2 Effacement (%): 60 Station: -2 Exam by:: V. Maynor Mwangi CNM   Assessment:  IUP at 37.1 weeks NICHD: Category 3 after epidural x 20 minutes, the Category 2 Neg scalp stim at 1840 Membranes:  SROM x 4hrs Labor progress: early IOL GBS: positive  Plan: Continuous monitoring O2, turn left, IV bolus,  terb Foley bulb placed  Dr Normand Sloopillard consulted Dr Sallye OberKulwa consulted    Holly Jackson, CNM, MSN 09/21/2014. 7:35 PM

## 2014-09-21 NOTE — Anesthesia Preprocedure Evaluation (Signed)
Anesthesia Evaluation  Patient identified by MRN, date of birth, ID band Patient awake    Reviewed: Allergy & Precautions, H&P , NPO status , Patient's Chart, lab work & pertinent test results, reviewed documented beta blocker date and time   History of Anesthesia Complications Negative for: history of anesthetic complications  Airway Mallampati: II TM Distance: >3 FB Neck ROM: full    Dental  (+) Teeth Intact   Pulmonary neg pulmonary ROS,  breath sounds clear to auscultation        Cardiovascular negative cardio ROS  Rhythm:regular Rate:Normal     Neuro/Psych Anxiety negative neurological ROS     GI/Hepatic negative GI ROS, Neg liver ROS, Cholestasis of pregnancy   Endo/Other  negative endocrine ROS  Renal/GU negative Renal ROS     Musculoskeletal   Abdominal   Peds  Hematology negative hematology ROS (+)   Anesthesia Other Findings   Reproductive/Obstetrics (+) Pregnancy                           Anesthesia Physical Anesthesia Plan  ASA: II  Anesthesia Plan: Epidural   Post-op Pain Management:    Induction:   Airway Management Planned:   Additional Equipment:   Intra-op Plan:   Post-operative Plan:   Informed Consent: I have reviewed the patients History and Physical, chart, labs and discussed the procedure including the risks, benefits and alternatives for the proposed anesthesia with the patient or authorized representative who has indicated his/her understanding and acceptance.     Plan Discussed with:   Anesthesia Plan Comments:         Anesthesia Quick Evaluation

## 2014-09-21 NOTE — Progress Notes (Addendum)
Labor Progress  Subjective: Sleeping, FOB at the bedside for support  Objective: BP 121/71  Pulse 93  Temp(Src) 98.4 F (36.9 C) (Oral)  Resp 20  Ht 5\' 2"  (1.575 m)  Wt 86.183 kg (190 lb)  BMI 34.74 kg/m2  LMP 12/19/2013 I/O last 3 completed shifts: In: -  Out: 2 [Stool:2]   FHT: 145, moderate variability, no accels, no decels CTX:  occassional Uterus gravid, soft non tender SVE:  Dilation: 1 Effacement (%): 50 Station: -2 Exam by:: V. Elzy Tomasello CNM Cytotec # 3 at 0615  Assessment:  IUP at 37.1 weeks NICHD: Category 2 Membranes:  intact Labor progress: IOL Cytotec IOL GBS: positive PCN allergy with severe features  Plan: Continue labor plan Continuous monitoring Cytotec #4 Frequent position changes to facilitate fetal rotation and descent. Will reassess with cervical exam at 1400 or earlier if necessary CMP DC clinda Vancomycin 1000mg  q 12 Dr Sallye OberKulwa to special    Lyn Deemer, CNM, MSN 09/21/2014. 11:24 AM

## 2014-09-21 NOTE — Progress Notes (Signed)
Kandice MoosAnn Gicira-Rudy, [id number]   Subjective -Patient reports contractions every 5-8 minutes.  Requesting pain medication for cramping.   Objective Filed Vitals:   09/20/14 2349  BP: 131/90  Pulse: 110  Temp: 98.2 F (36.8 C)  Resp:         Physical Exam  Constitutional: No distress.  Pulmonary/Chest: Effort normal.  Neurological: She is alert.  Skin: Skin is warm and dry.   Pelvic: NWG:NFAOZHYQSVE:Dilation: Closed Effacement (%): 50 Station: -2 Presentation: Vertex Exam by:: Lakela Kuba    FHR: 140 bpm, Mod Var, -Decels, +Accels UC:  Occassional, palpates   Induction/Augmentation Agent: Pitocin: None Cytotec Dose: 2nd Dose Membranes: Intact  Assessment IUP at 37 wks Cat I FT Bishop Score: 5 Cholestasis Cramping  Plan -Discussed usage of pain medication, okay to give tylenol for cramping -Will reassess in 4 hours -Continue other mgmt as ordered  Marky Buresh LYNN, CNM  1:08 AM

## 2014-09-21 NOTE — Progress Notes (Signed)
Addendum Terb given at 1847 Category 2, FHR 160, minimum variability, no accel, no decel Ctx q 4-5

## 2014-09-21 NOTE — Anesthesia Postprocedure Evaluation (Signed)
Anesthesia Post Note  Patient: Holly Jackson  Procedure(s) Performed: Procedure(s) (LRB): CESAREAN SECTION (N/A)  Anesthesia type: Epidural  Patient location: PACU  Post pain: Pain level controlled  Post assessment: Post-op Vital signs reviewed  Last Vitals:  Filed Vitals:   09/21/14 2330  BP: 118/74  Pulse: 98  Temp:   Resp: 16    Post vital signs: Reviewed  Level of consciousness: awake  Complications: No apparent anesthesia complications

## 2014-09-21 NOTE — Progress Notes (Signed)
Holly Jackson, [id number]   Subjective -[phone]: Patient asleep in bed, did not attempt to awaken.  Will return at 0600 to assess and possibly start pitocin.  [id number]: Patient reports increase in cramping.  States she had nubain which worked well.  Patient requesting more pain medication.   Objective Filed Vitals:   09/21/14 0400  BP: 121/71  Pulse: 93  Temp:   Resp:      Total I/O In: -  Out: 2 [Stool:2]  Physical Exam  Constitutional: No distress.  Genitourinary: Vagina normal.  Musculoskeletal: Normal range of motion.  Neurological: She is alert.  Skin: Skin is warm and dry.    YNW:GNFAOZHYSVE:Dilation: Closed Effacement (%): 50 Station: -2 Presentation: Vertex Exam by:: Holly Jackson    FHR: 135 bpm, Mod Var, -Decels, +Accels UC:  None graphed or palpated  Induction/Augmentation Agent: Pitocin: None Cytotec Dose: 3rd Dose Membranes: Intact  Assessment IUP at 37 wks Cat I FT Bishop Score: 5 Cholestasis Cervical Ripening Pain  Plan -Discussed pain mgmt options -POC discussed -Patient requesting Holly Jackson for delivery -Informed that Holly Jackson did state she would do delivery, but only if delivered Monday---will verify -Continue other mgmt as ordered -Will report to Holly Jackson, CNM  Holly Jackson, CNM  6:33 AM

## 2014-09-21 NOTE — Progress Notes (Signed)
Holly Jackson MRN: [id number]  Subjective: -Patient comfortable with epidural.  Infant with non-reassuring fht despite interventions. Dr. Carmela HurtE. Kulwa on standby.   Objective: BP 119/62  Pulse 114  Temp(Src) 98.4 F (36.9 C) (Oral)  Resp 18  Ht 5\' 2"  (1.575 m)  Wt 190 lb (86.183 kg)  BMI 34.74 kg/m2  SpO2 100%  LMP 12/19/2013 I/O last 3 completed shifts: In: -  Out: 2 [Stool:2] Total I/O In: -  Out: 550 [Urine:550] FHT:  155 bpm, Absent Var, -Decels, -Accels UC:  Occasional, palpates mild SVE:   Dilation: 4 Effacement (%): 60 Station: -2 Exam by:: Sabas SousJ. Juline Sanderford, CNM Membranes: SROM at 1415 Pitocin: None Foley Bulb Removed  Assessment:  IUP at  Cat II FT  Cholestasis GBS Positive Negative Scalp Stimulation  Plan: -Discussed fetal tracing and persistent cat II tracing despite interventions -Dr. Carmela HurtE. Kulwa consulted and recommends primary c/s -Discussed risks of procedure including but not limited to bleeding, infection, damage to other organs, and injury to infant -Patient verbalized understanding, all questions and concerns addressed -C/S consent forms signed -Prepare for C/S due to Persistent Cat II FT   Holly Jackson,CNM, MSN 09/21/2014, 8:40 PM

## 2014-09-22 ENCOUNTER — Encounter (HOSPITAL_COMMUNITY): Payer: Self-pay

## 2014-09-22 DIAGNOSIS — O34219 Maternal care for unspecified type scar from previous cesarean delivery: Secondary | ICD-10-CM

## 2014-09-22 LAB — CBC
HCT: 30.6 % — ABNORMAL LOW (ref 36.0–46.0)
Hemoglobin: 10 g/dL — ABNORMAL LOW (ref 12.0–15.0)
MCH: 26.1 pg (ref 26.0–34.0)
MCHC: 32.7 g/dL (ref 30.0–36.0)
MCV: 79.9 fL (ref 78.0–100.0)
Platelets: 173 10*3/uL (ref 150–400)
RBC: 3.83 MIL/uL — AB (ref 3.87–5.11)
RDW: 15.2 % (ref 11.5–15.5)
WBC: 10.3 10*3/uL (ref 4.0–10.5)

## 2014-09-22 MED ORDER — FERROUS SULFATE 325 (65 FE) MG PO TABS
325.0000 mg | ORAL_TABLET | Freq: Two times a day (BID) | ORAL | Status: DC
  Administered 2014-09-23 – 2014-09-24 (×3): 325 mg via ORAL
  Filled 2014-09-22 (×3): qty 1

## 2014-09-22 MED ORDER — ZOLPIDEM TARTRATE 5 MG PO TABS: 5.0000 mg | ORAL_TABLET | Freq: Every evening | ORAL | Status: DC | PRN

## 2014-09-22 MED ORDER — DIBUCAINE 1 % RE OINT: 1.0000 "application " | TOPICAL_OINTMENT | RECTAL | Status: DC | PRN

## 2014-09-22 MED ORDER — IBUPROFEN 600 MG PO TABS
600.0000 mg | ORAL_TABLET | Freq: Four times a day (QID) | ORAL | Status: DC
  Administered 2014-09-22 – 2014-09-24 (×9): 600 mg via ORAL
  Filled 2014-09-22 (×9): qty 1

## 2014-09-22 MED ORDER — SODIUM CHLORIDE 0.9 % IJ SOLN: 3.0000 mL | INTRAMUSCULAR | Status: DC | PRN

## 2014-09-22 MED ORDER — OXYTOCIN 40 UNITS IN LACTATED RINGERS INFUSION - SIMPLE MED: 62.5000 mL/h | INTRAVENOUS | Status: AC

## 2014-09-22 MED ORDER — NALBUPHINE HCL 10 MG/ML IJ SOLN: 5.0000 mg | INTRAMUSCULAR | Status: DC | PRN

## 2014-09-22 MED ORDER — ONDANSETRON HCL 4 MG/2ML IJ SOLN: 4.0000 mg | Freq: Three times a day (TID) | INTRAMUSCULAR | Status: DC | PRN

## 2014-09-22 MED ORDER — NALOXONE HCL 0.4 MG/ML IJ SOLN: 0.4000 mg | INTRAMUSCULAR | Status: DC | PRN

## 2014-09-22 MED ORDER — DIPHENHYDRAMINE HCL 25 MG PO CAPS
25.0000 mg | ORAL_CAPSULE | Freq: Four times a day (QID) | ORAL | Status: DC | PRN
  Administered 2014-09-22 (×2): 25 mg via ORAL
  Filled 2014-09-22: qty 1

## 2014-09-22 MED ORDER — NALOXONE HCL 1 MG/ML IJ SOLN
1.0000 ug/kg/h | INTRAVENOUS | Status: DC | PRN
  Filled 2014-09-22: qty 2

## 2014-09-22 MED ORDER — NALBUPHINE HCL 10 MG/ML IJ SOLN: 5.0000 mg | Freq: Once | INTRAMUSCULAR | Status: AC | PRN

## 2014-09-22 MED ORDER — SIMETHICONE 80 MG PO CHEW
80.0000 mg | CHEWABLE_TABLET | Freq: Three times a day (TID) | ORAL | Status: DC
  Administered 2014-09-22 – 2014-09-24 (×7): 80 mg via ORAL
  Filled 2014-09-22 (×7): qty 1

## 2014-09-22 MED ORDER — PRENATAL MULTIVITAMIN CH
1.0000 | ORAL_TABLET | Freq: Every day | ORAL | Status: DC
  Administered 2014-09-22 – 2014-09-24 (×3): 1 via ORAL
  Filled 2014-09-22 (×3): qty 1

## 2014-09-22 MED ORDER — ONDANSETRON HCL 4 MG/2ML IJ SOLN: 4.0000 mg | INTRAMUSCULAR | Status: DC | PRN

## 2014-09-22 MED ORDER — SIMETHICONE 80 MG PO CHEW: 80.0000 mg | CHEWABLE_TABLET | ORAL | Status: DC | PRN

## 2014-09-22 MED ORDER — OXYCODONE-ACETAMINOPHEN 5-325 MG PO TABS
2.0000 | ORAL_TABLET | ORAL | Status: DC | PRN
  Administered 2014-09-22 – 2014-09-23 (×5): 2 via ORAL
  Filled 2014-09-22 (×5): qty 2

## 2014-09-22 MED ORDER — WITCH HAZEL-GLYCERIN EX PADS: 1.0000 "application " | MEDICATED_PAD | CUTANEOUS | Status: DC | PRN

## 2014-09-22 MED ORDER — SENNOSIDES-DOCUSATE SODIUM 8.6-50 MG PO TABS
2.0000 | ORAL_TABLET | ORAL | Status: DC
  Administered 2014-09-22 – 2014-09-23 (×2): 2 via ORAL
  Filled 2014-09-22 (×2): qty 2

## 2014-09-22 MED ORDER — DIPHENHYDRAMINE HCL 50 MG/ML IJ SOLN
12.5000 mg | INTRAMUSCULAR | Status: DC | PRN
  Administered 2014-09-22: 12.5 mg via INTRAVENOUS

## 2014-09-22 MED ORDER — NALBUPHINE HCL 10 MG/ML IJ SOLN
5.0000 mg | INTRAMUSCULAR | Status: DC | PRN
  Administered 2014-09-22: 5 mg via INTRAVENOUS
  Filled 2014-09-22: qty 1

## 2014-09-22 MED ORDER — IBUPROFEN 600 MG PO TABS: 600.0000 mg | ORAL_TABLET | Freq: Four times a day (QID) | ORAL | Status: DC | PRN

## 2014-09-22 MED ORDER — TETANUS-DIPHTH-ACELL PERTUSSIS 5-2.5-18.5 LF-MCG/0.5 IM SUSP: 0.5000 mL | Freq: Once | INTRAMUSCULAR | Status: DC

## 2014-09-22 MED ORDER — DIPHENHYDRAMINE HCL 50 MG/ML IJ SOLN
INTRAMUSCULAR | Status: AC
  Filled 2014-09-22: qty 1

## 2014-09-22 MED ORDER — DIPHENHYDRAMINE HCL 25 MG PO CAPS
25.0000 mg | ORAL_CAPSULE | ORAL | Status: DC | PRN
Start: 2014-09-22 — End: 2014-09-24
  Filled 2014-09-22 (×2): qty 1

## 2014-09-22 MED ORDER — ONDANSETRON HCL 4 MG PO TABS: 4.0000 mg | ORAL_TABLET | ORAL | Status: DC | PRN

## 2014-09-22 MED ORDER — LACTATED RINGERS IV SOLN
INTRAVENOUS | Status: DC
  Administered 2014-09-22: 03:00:00 via INTRAVENOUS

## 2014-09-22 MED ORDER — LANOLIN HYDROUS EX OINT: 1.0000 "application " | TOPICAL_OINTMENT | CUTANEOUS | Status: DC | PRN

## 2014-09-22 MED ORDER — OXYCODONE-ACETAMINOPHEN 5-325 MG PO TABS
1.0000 | ORAL_TABLET | ORAL | Status: DC | PRN
  Administered 2014-09-22 – 2014-09-24 (×6): 1 via ORAL
  Filled 2014-09-22 (×6): qty 1

## 2014-09-22 MED ORDER — SIMETHICONE 80 MG PO CHEW
80.0000 mg | CHEWABLE_TABLET | ORAL | Status: DC
  Administered 2014-09-22 – 2014-09-23 (×2): 80 mg via ORAL
  Filled 2014-09-22 (×2): qty 1

## 2014-09-22 MED ORDER — MENTHOL 3 MG MT LOZG: 1.0000 | LOZENGE | OROMUCOSAL | Status: DC | PRN

## 2014-09-22 NOTE — Discharge Summary (Signed)
Cesarean Section Delivery Discharge Summary  Holly Jackson  DOB:    Oct 21, 1981 MRN:    [id number] CSN:    [id number]  Date of admission:                  09/20/14  Date of discharge:                   09/24/14  Procedures this admission:  Primary LTCS due to [facility]  Date of Delivery: 09/21/14  Newborn Data:  Live born female  Birth Weight: 6 lb 4 oz (2835 g) APGAR: 5, 9  Home with mother.  History of Present Illness:  Holly Jackson is a 33 y.o. female, G1P1001, who presents at [redacted]w[redacted]d weeks gestation. The patient has been followed at the [facility] and Gynecology division of [facility].    Her pregnancy has been complicated by:  Patient Active Problem List   Diagnosis Date Noted  . Status post primary low transverse cesarean section 09/22/2014  . Cholestasis of pregnancy 09/17/2014  . GBS (group B Streptococcus carrier), +RV culture, currently pregnant 09/17/2014  . Allergy to penicillin 09/17/2014  . Allergy to sulfa drugs 09/17/2014  . Obesity, unspecified 09/17/2014  . Generalized anxiety disorder 09/17/2014  . Hx of abdominoplasty 09/17/2014      Hospital Course--Unscheduled Cesarean:  Admitted 09/20/14 for induction due to cholestasis. Cytotech and foley bulb were utilized for induction.  Positive GBS.  Utilized epidural for pain management.  Due to [facility] at 4 cm, she was consented for cesarean, with Dr. Sallye Ober performing a primary LTCS under epidural anesthesia, with delivery of a viable female, with weight and Apgars as listed below. Infant was in good condition and remained at the patient's bedside.  The patient was taken to recovery in good condition.  Patient planned to breast feed.  On post-op day 1, patient was doing well, tolerating a regular diet, with Hgb of 10.0, down from 13.4 on 09/20/14.  Throughout her stay, her physical exam was WNL, her incision was CDI, and her vital signs remained stable.  By post-op day 3,  she was up ad lib, tolerating a regular diet, with good pain control with po med.  She was deemed to have received the full benefit of her hospital stay, and was discharged home in stable condition.  Contraceptive choice was IUD.    Feeding:  breast  Contraception:  IUD  Discharge hemoglobin:  Hemoglobin  Date Value Ref Range Status  09/22/2014 10.0* 12.0 - 15.0 g/dL Final     REPEATED TO VERIFY     DELTA CHECK NOTED  09/20/2014 13.4  12.0 - 15.0 g/dL Final     HCT  Date Value Ref Range Status  09/22/2014 30.6* 36.0 - 46.0 % Final  09/20/2014 39.6  36.0 - 46.0 % Final     WBC  Date Value Ref Range Status  09/22/2014 10.3  4.0 - 10.5 K/uL Final  09/20/2014 8.5  4.0 - 10.5 K/uL Final    Discharge Physical Exam:   General: alert Lochia: appropriate Uterine Fundus: firm Abdomen:  + bowel sounds Incision: Honeycomb dressing CDI DVT Evaluation: No evidence of DVT seen on physical exam. Negative Homan's sign.  Intrapartum Procedures: cesarean: low cervical, transverse Postpartum Procedures: none Complications-Operative and Postpartum: none  Discharge Diagnoses: Term Pregnancy-delivered and cholestasis, NRFHR  Discharge Information:  Activity:           pelvic rest Diet:  routine Medications: Ibuprofen and Percocet Condition:      stable Instructions:  Discharge to: home  Follow-up Information   [facility] In 6 weeks. (Call for concerns or questions.)    Specialty:  Obstetrics and Gynecology   Contact information:   [address] [place] [id number] [phone]       [name], Ying Blankenhorn CNM 09/24/2014 8:31 AM

## 2014-09-22 NOTE — Lactation Note (Signed)
This note was copied from the chart of Holly Kandice Moosnn Gicira-Levier. Lactation Consultation Note Follow up consult, baby 17 hours old, just had bath and is STS with mom.  Assisted mom with hand expression; mom has large amount colostrum. Baby did latch well with 1 or 2 sucks, but is very sleepy.  Left baby STS with mom. Inst mom to call and request breastfeeding help with next feeding.   Patient Name: Holly Kandice Moosnn Gicira-Munguia LKGMW'NToday's Date: Jackson Reason for consult: Follow-up assessment;Late preterm infant   Maternal Data Has patient been taught Hand Expression?: Yes  Feeding Length of feed: 0 min  LATCH Score/Interventions                      Lactation Tools Discussed/Used     Consult Status Consult Status: Follow-up Follow-up type: In-patient    Octavio MannsSanders, Ponciano Shealy [facility] Jackson, 3:23 PM

## 2014-09-22 NOTE — Progress Notes (Addendum)
Subjective: Postpartum Day 1: Ces[facility] Delivery due to [facility] Patient has ambulated to BR, reports no syncope or dizziness. Foley still in place. Feeding:  Breast Contraceptive plan:  Undecided at present  Objective: Vital signs in last 24 hours: Temp:  [97.4 F (36.3 C)-99.9 F (37.7 C)] 99.9 F (37.7 C) (09/30 0600) Pulse Rate:  [76-130] 101 (09/30 0610) Resp:  [16-22] 18 (09/30 0600) BP: (106-147)/(55-100) 121/67 mmHg (09/30 0610) SpO2:  [91 %-100 %] 97 % (09/30 0600)  Physical Exam:  General: alert Lochia: appropriate Uterine Fundus: firm Abdomen:  + bowel sounds, mild gaseous distension Incision: Honeycomb dressing CDI DVT Evaluation: No evidence of DVT seen on physical exam. Negative Homan's sign. Foley draining clear urine.    Recent Labs  09/20/14 2030 09/22/14 0615  HGB 13.4 10.0*  HCT 39.6 30.6*  WBC 8.5 10.3    Assessment/Plan: Status post Cesarean section day--NRFHR. Doing well postoperatively.  Continue current care. Anticipate d/c 09/24/14    [name], VICKI CNM, [place] 09/22/2014, 7:56 AM  I saw and examined patient at bedside and agree with above findings, assessment and plan as per [name] Vicki, CNM.  Foley catheter to be removed now.  May  Discontinue Ursodiol.  May d/c IVF after voiding.  I discussed intra-op procedure, findings.

## 2014-09-22 NOTE — Progress Notes (Signed)
Subjective: Postpartum Day 1: Cesarean Delivery due to NRFHT Patient up ad lib, reports no syncope or dizziness. Feeding:  breastfeeding Contraceptive plan:  unsure  Objective: Vital signs in last 24 hours: Temp:  [97.4 F (36.3 C)-99.9 F (37.7 C)] 99.3 F (37.4 C) (09/30 1402) Pulse Rate:  [81-130] 101 (09/30 1402) Resp:  [16-22] 20 (09/30 1402) BP: (106-130)/(56-80) 111/66 mmHg (09/30 1402) SpO2:  [91 %-100 %] 97 % (09/30 1402)  Physical Exam:  General: alert and cooperative Lochia: appropriate Uterine Fundus: firm Abdomen:  + bowel sounds, non distended Incision: healing well  Honeycomb dressing CDI DVT Evaluation: No evidence of DVT seen on physical exam. Homan's sign: Negative   Recent Labs  09/20/14 2030 09/22/14 0615  HGB 13.4 10.0*  HCT 39.6 30.6*  WBC 8.5 10.3    Assessment: Status post Cesarean section day 1. Doing well postoperatively.  Honeycomb dressing in place, no significant drainage Anemia - hemodynamicly stable.    Plan: Continue current care. Breastfeeding and Lactation consult Dr. Sallye OberKulwa  updated on patient status Iron supplement   Holly Jackson, CNM, MSN 09/22/2014. 6:48 PM

## 2014-09-22 NOTE — Anesthesia Postprocedure Evaluation (Signed)
Anesthesia Post Note  Patient: Holly Jackson  Procedure(s) Performed: Procedure(s) (LRB): CESAREAN SECTION (N/A)  Anesthesia type: Epidural  Patient location: Mother/Baby  Post pain: Pain level controlled  Post assessment: Post-op Vital signs reviewed  Last Vitals:  Filed Vitals:   09/22/14 0610  BP: 121/67  Pulse: 101  Temp:   Resp:     Post vital signs: Reviewed  Level of consciousness:alert  Complications: No apparent anesthesia complications

## 2014-09-22 NOTE — Addendum Note (Signed)
Addendum created 09/22/14 0002 by Leilani AbleFranklin Millee Denise, MD   Modules edited: Orders, PRL Based Order Sets

## 2014-09-22 NOTE — Addendum Note (Signed)
Addendum created 09/22/14 0740 by Graciela HusbandsWynn O Deema Juncaj, CRNA   Modules edited: Notes Section   Notes Section:  File: [id number]

## 2014-09-23 ENCOUNTER — Encounter (HOSPITAL_COMMUNITY): Payer: Self-pay | Admitting: Obstetrics & Gynecology

## 2014-09-23 NOTE — Progress Notes (Signed)
Subjective: Postpartum Day 2: [facility] Delivery [facility] Patient up ad lib, reports no syncope or dizziness. Feeding:  Breast Contraceptive plan:  Unsure still  Objective: Vital signs in last 24 hours: Temp:  [98.6 F (37 C)-99.3 F (37.4 C)] 98.6 F (37 C) (10/01 0541) Pulse Rate:  [99-101] 100 (10/01 0541) Resp:  [18-20] 20 (10/01 0541) BP: (107-114)/(61-67) 112/61 mmHg (10/01 0541) SpO2:  [97 %-98 %] 97 % (09/30 2230)  Physical Exam:  General: alert Lochia: appropriate Uterine Fundus: firm Abdomen:  + bowel sounds Incision: Honeycomb dressing CDI DVT Evaluation: No evidence of DVT seen on physical exam. Negative Homan's sign.    Recent Labs  09/20/14 2030 09/22/14 0615  HGB 13.4 10.0*  HCT 39.6 30.6*  WBC 8.5 10.3    Assessment/Plan: Status post Cesarean section day 2 Doing well postoperatively.  Continue current care. Plan for discharge tomorrow    [name], Holly Jackson CNM, [place] 09/23/2014, 9:27 AM

## 2014-09-23 NOTE — Lactation Note (Addendum)
This note was copied from the chart of Holly Jackson. Lactation Consultation Note  Parents called for assistance with latching.  Baby has a tight lingual frenulum.  Baby has limited movement of her tongue.  Unable to protrude tongue out of her mouth. Spoke with Sherwood Gamblerasha Dial MD who stated she did not think it was an issue. MD stated Mother [facility].  Baby latches on breast but does not sustain latch. Weak sucking, no swallows witnessed. Falls asleep after short burst of sucking. Encouraged mother to massage her breasts and pull baby away from the body to wake her during feeding. RN is setting up DEBP.  Suggest mother post pump 4-6 times a day after feedings and start supplementing with her breastmilk. Reviewed hand expression.  Will follow up to assist with giving back volume pumped.  Patient Name: Holly Kandice Moosnn Gicira-Accomando ZOXWR'UToday's Date: 09/23/2014 Reason for consult: Follow-up assessment   Maternal Data    Feeding Feeding Type: Breast Fed  LATCH Score/Interventions Latch: Repeated attempts needed to sustain latch, nipple held in mouth throughout feeding, stimulation needed to elicit sucking reflex. Intervention(s): Waking techniques;Skin to skin Intervention(s): Breast massage;Assist with latch;Adjust position  Audible Swallowing: None Intervention(s): Alternate breast massage  Type of Nipple: Everted at rest and after stimulation  Comfort (Breast/Nipple): Soft / non-tender     Hold (Positioning): Assistance needed to correctly position infant at breast and maintain latch.  LATCH Score: 6  Lactation Tools Discussed/Used     Consult Status Consult Status: Follow-up Date: 09/24/14 Follow-up type: In-patient    Dahlia ByesBerkelhammer, Ruth [facility] 09/23/2014, 8:50 AM

## 2014-09-24 ENCOUNTER — Encounter (HOSPITAL_COMMUNITY): Payer: Self-pay | Admitting: Obstetrics & Gynecology

## 2014-09-24 ENCOUNTER — Ambulatory Visit: Payer: Self-pay

## 2014-09-24 MED ORDER — FENTANYL 2.5 MCG/ML BUPIVACAINE 1/10 % EPIDURAL INFUSION (WH - ANES)
INTRAMUSCULAR | Status: DC | PRN
  Administered 2014-09-21: 14 mL/h via EPIDURAL

## 2014-09-24 MED ORDER — IBUPROFEN 600 MG PO TABS: 600.0000 mg | ORAL_TABLET | Freq: Four times a day (QID) | ORAL | Status: DC | PRN

## 2014-09-24 MED ORDER — LIDOCAINE HCL (PF) 1 % IJ SOLN
INTRAMUSCULAR | Status: DC | PRN
  Administered 2014-09-21 (×4): 4 mL

## 2014-09-24 MED ORDER — OXYCODONE-ACETAMINOPHEN 5-325 MG PO TABS: 1.0000 | ORAL_TABLET | ORAL | Status: DC | PRN

## 2014-09-24 NOTE — Lactation Note (Signed)
This note was copied from the chart of Holly Kandice Moosnn Gicira-Monacelli. Lactation Consultation Note  Patient Name: Holly Jackson Date: 09/24/2014 Reason for consult: Follow-up assessment Baby 62 hours of life. Mom states that she is putting baby to breast and supplementing. Enc mom to put baby to breast first, then post-pump as needed. Mom states that she has a DEBP at home. Mom concerned that she is not seeing any milk yet, but colostrum has increased. Discussed with mom that milk usually comes in day 3-5 for a first-time mom. Enc mom to massage breast and hand express before and after pumping. Enc mom to use EBM at next feeding for supplementation as needed. Referred mom to Baby and Me booklet for EBM storage guidelines, and number of diapers to expect by day of life. Discussed that mom should not have pain with breastfeeding. Enc to maintain a deep latch and discuss with pediatrician if mom believes frenulum causing BF issues.  Mom aware of OP/BFSG and LC phone line assistance.   Maternal Data    Feeding Feeding Type: Breast Fed Length of feed: 30 min  LATCH Score/Interventions                      Lactation Tools Discussed/Used     Consult Status Consult Status: Complete    [name] 09/24/2014, 12:10 PM

## 2014-09-24 NOTE — Discharge Instructions (Signed)

## 2014-09-24 NOTE — Addendum Note (Signed)
Addendum created 09/24/14 1538 by Dana AllanAmy Johnross Nabozny, MD   Modules edited: Anesthesia Events, Anesthesia Medication Administration, Anesthesia Responsible Staff

## 2014-10-24 ENCOUNTER — Inpatient Hospital Stay (HOSPITAL_COMMUNITY)
Admission: AD | Admit: 2014-10-24 | Payer: BC Managed Care – PPO | Source: Ambulatory Visit | Admitting: Obstetrics and Gynecology

## 2014-10-25 ENCOUNTER — Encounter (HOSPITAL_COMMUNITY): Payer: Self-pay | Admitting: Obstetrics & Gynecology

## 2016-07-09 DIAGNOSIS — Z3491 Encounter for supervision of normal pregnancy, unspecified, first trimester: Secondary | ICD-10-CM | POA: Diagnosis not present

## 2016-07-09 LAB — OB RESULTS CONSOLE GC/CHLAMYDIA
CHLAMYDIA, DNA PROBE: NEGATIVE
Gonorrhea: NEGATIVE

## 2016-07-09 LAB — OB RESULTS CONSOLE HEPATITIS B SURFACE ANTIGEN: Hepatitis B Surface Ag: NEGATIVE

## 2016-07-09 LAB — OB RESULTS CONSOLE HIV ANTIBODY (ROUTINE TESTING): HIV: NONREACTIVE

## 2016-07-09 LAB — OB RESULTS CONSOLE RUBELLA ANTIBODY, IGM: Rubella: IMMUNE

## 2016-07-09 LAB — OB RESULTS CONSOLE ANTIBODY SCREEN: Antibody Screen: NEGATIVE

## 2016-07-09 LAB — OB RESULTS CONSOLE RPR: RPR: NONREACTIVE

## 2016-07-09 LAB — OB RESULTS CONSOLE ABO/RH: RH Type: POSITIVE

## 2016-07-31 DIAGNOSIS — Z3491 Encounter for supervision of normal pregnancy, unspecified, first trimester: Secondary | ICD-10-CM | POA: Diagnosis not present

## 2016-07-31 DIAGNOSIS — R6889 Other general symptoms and signs: Secondary | ICD-10-CM | POA: Diagnosis not present

## 2016-07-31 DIAGNOSIS — Z3A10 10 weeks gestation of pregnancy: Secondary | ICD-10-CM | POA: Diagnosis not present

## 2016-07-31 DIAGNOSIS — Z3A09 9 weeks gestation of pregnancy: Secondary | ICD-10-CM | POA: Diagnosis not present

## 2016-07-31 DIAGNOSIS — Z124 Encounter for screening for malignant neoplasm of cervix: Secondary | ICD-10-CM | POA: Diagnosis not present

## 2016-07-31 DIAGNOSIS — O3680X0 Pregnancy with inconclusive fetal viability, not applicable or unspecified: Secondary | ICD-10-CM | POA: Diagnosis not present

## 2016-09-03 DIAGNOSIS — L299 Pruritus, unspecified: Secondary | ICD-10-CM | POA: Diagnosis not present

## 2016-09-24 DIAGNOSIS — J019 Acute sinusitis, unspecified: Secondary | ICD-10-CM | POA: Diagnosis not present

## 2016-09-24 DIAGNOSIS — J029 Acute pharyngitis, unspecified: Secondary | ICD-10-CM | POA: Diagnosis not present

## 2016-09-24 DIAGNOSIS — R509 Fever, unspecified: Secondary | ICD-10-CM | POA: Diagnosis not present

## 2016-09-26 DIAGNOSIS — Z3492 Encounter for supervision of normal pregnancy, unspecified, second trimester: Secondary | ICD-10-CM | POA: Diagnosis not present

## 2016-09-26 DIAGNOSIS — Z3A17 17 weeks gestation of pregnancy: Secondary | ICD-10-CM | POA: Diagnosis not present

## 2016-10-19 DIAGNOSIS — K5900 Constipation, unspecified: Secondary | ICD-10-CM | POA: Diagnosis not present

## 2016-10-19 DIAGNOSIS — G44201 Tension-type headache, unspecified, intractable: Secondary | ICD-10-CM | POA: Diagnosis not present

## 2016-10-19 DIAGNOSIS — Z136 Encounter for screening for cardiovascular disorders: Secondary | ICD-10-CM | POA: Diagnosis not present

## 2016-10-29 DIAGNOSIS — Z369 Encounter for antenatal screening, unspecified: Secondary | ICD-10-CM | POA: Diagnosis not present

## 2016-10-29 DIAGNOSIS — Z3A22 22 weeks gestation of pregnancy: Secondary | ICD-10-CM | POA: Diagnosis not present

## 2016-10-29 DIAGNOSIS — Z3492 Encounter for supervision of normal pregnancy, unspecified, second trimester: Secondary | ICD-10-CM | POA: Diagnosis not present

## 2016-11-26 DIAGNOSIS — Z3A26 26 weeks gestation of pregnancy: Secondary | ICD-10-CM | POA: Diagnosis not present

## 2016-11-26 DIAGNOSIS — O3692X0 Maternal care for fetal problem, unspecified, second trimester, not applicable or unspecified: Secondary | ICD-10-CM | POA: Diagnosis not present

## 2016-11-26 DIAGNOSIS — Z23 Encounter for immunization: Secondary | ICD-10-CM | POA: Diagnosis not present

## 2016-11-26 DIAGNOSIS — R6889 Other general symptoms and signs: Secondary | ICD-10-CM | POA: Diagnosis not present

## 2016-12-10 DIAGNOSIS — O0993 Supervision of high risk pregnancy, unspecified, third trimester: Secondary | ICD-10-CM | POA: Diagnosis not present

## 2016-12-10 DIAGNOSIS — Z3A28 28 weeks gestation of pregnancy: Secondary | ICD-10-CM | POA: Diagnosis not present

## 2016-12-10 DIAGNOSIS — Z23 Encounter for immunization: Secondary | ICD-10-CM | POA: Diagnosis not present

## 2016-12-27 DIAGNOSIS — Z3A30 30 weeks gestation of pregnancy: Secondary | ICD-10-CM | POA: Diagnosis not present

## 2016-12-27 DIAGNOSIS — O43123 Velamentous insertion of umbilical cord, third trimester: Secondary | ICD-10-CM | POA: Diagnosis not present

## 2017-01-10 DIAGNOSIS — R102 Pelvic and perineal pain: Secondary | ICD-10-CM | POA: Diagnosis not present

## 2017-01-10 DIAGNOSIS — R12 Heartburn: Secondary | ICD-10-CM | POA: Diagnosis not present

## 2017-01-10 DIAGNOSIS — Z3493 Encounter for supervision of normal pregnancy, unspecified, third trimester: Secondary | ICD-10-CM | POA: Diagnosis not present

## 2017-01-10 DIAGNOSIS — Z3A34 34 weeks gestation of pregnancy: Secondary | ICD-10-CM | POA: Diagnosis not present

## 2017-01-16 ENCOUNTER — Other Ambulatory Visit: Payer: Self-pay | Admitting: Obstetrics and Gynecology

## 2017-01-22 DIAGNOSIS — Z3483 Encounter for supervision of other normal pregnancy, third trimester: Secondary | ICD-10-CM | POA: Diagnosis not present

## 2017-01-22 DIAGNOSIS — R51 Headache: Secondary | ICD-10-CM | POA: Diagnosis not present

## 2017-01-22 DIAGNOSIS — O43122 Velamentous insertion of umbilical cord, second trimester: Secondary | ICD-10-CM | POA: Diagnosis not present

## 2017-01-22 DIAGNOSIS — Z3A34 34 weeks gestation of pregnancy: Secondary | ICD-10-CM | POA: Diagnosis not present

## 2017-02-05 DIAGNOSIS — Z3A36 36 weeks gestation of pregnancy: Secondary | ICD-10-CM | POA: Diagnosis not present

## 2017-02-05 DIAGNOSIS — O26619 Liver and biliary tract disorders in pregnancy, unspecified trimester: Secondary | ICD-10-CM | POA: Diagnosis not present

## 2017-02-05 DIAGNOSIS — O368130 Decreased fetal movements, third trimester, not applicable or unspecified: Secondary | ICD-10-CM | POA: Diagnosis not present

## 2017-02-05 LAB — OB RESULTS CONSOLE GBS: STREP GROUP B AG: NEGATIVE

## 2017-02-13 DIAGNOSIS — Z3A37 37 weeks gestation of pregnancy: Secondary | ICD-10-CM | POA: Diagnosis not present

## 2017-02-13 DIAGNOSIS — O43123 Velamentous insertion of umbilical cord, third trimester: Secondary | ICD-10-CM | POA: Diagnosis not present

## 2017-02-19 ENCOUNTER — Encounter (HOSPITAL_COMMUNITY): Payer: Self-pay

## 2017-02-19 DIAGNOSIS — O43123 Velamentous insertion of umbilical cord, third trimester: Secondary | ICD-10-CM | POA: Diagnosis not present

## 2017-02-19 DIAGNOSIS — O43199 Other malformation of placenta, unspecified trimester: Secondary | ICD-10-CM | POA: Diagnosis not present

## 2017-02-19 DIAGNOSIS — Z3A38 38 weeks gestation of pregnancy: Secondary | ICD-10-CM | POA: Diagnosis not present

## 2017-02-21 DIAGNOSIS — Z029 Encounter for administrative examinations, unspecified: Secondary | ICD-10-CM | POA: Diagnosis not present

## 2017-02-26 DIAGNOSIS — O321XX9 Maternal care for breech presentation, other fetus: Secondary | ICD-10-CM | POA: Diagnosis not present

## 2017-02-26 DIAGNOSIS — Z3A39 39 weeks gestation of pregnancy: Secondary | ICD-10-CM | POA: Diagnosis not present

## 2017-02-28 NOTE — Patient Instructions (Signed)
20 Holly Jackson  02/28/2017   Your procedure is scheduled on:  03/04/2017  Enter through the [facility] at 0700 AM.  If a volunteer is at the desk tell them you wish to see the admitting person for your Cesarean Section.  If not call [phone] on the phone at the desk, but be sure to tell them you need to see the Admitting person before going to Labor and Delivery with them.      Bring a piece of ID with you and whatever you wish to take pictures with (cell phone or camera are welcome).  Please leave all other belongings locked in your vehicle until you are in your final room on [facility].    Call me at [phone] today or tomorrow between hours of 9-3 if I can help in any way.  Otherwise call the number below for questions.    Please email me that you received this email.   Call this number if you have problems the morning of surgery: [phone]   Remember:   Do not eat food:After Midnight.  Do not drink clear liquids: After Midnight.  Take these medicines the morning of surgery with A SIP OF WATER: may take atarax and or protonix if needed   Do not wear jewelry, make-up or nail polish.  Do not wear lotions, powders, or perfumes. Do not wear deodorant.  Do not shave 48 hours prior to surgery.  Do not bring valuables to the hospital.  [facility] is not   responsible for any belongings or valuables brought to the hospital.  Contacts, dentures or bridgework may not be worn into surgery.  Leave suitcase in the car. After surgery it may be brought to your room.  For patients admitted to the hospital, checkout time is 11:00 AM the day of              discharge.   Patients discharged the day of surgery will not be allowed to drive             home.  Name and phone number of your driver: na  Special Instructions:   N/A   Please read over the following fact sheets that you were given:   Surgical Site Infection Prevention

## 2017-03-01 ENCOUNTER — Encounter (HOSPITAL_COMMUNITY)
Admission: RE | Admit: 2017-03-01 | Discharge: 2017-03-01 | Disposition: A | Discharge status: Home / Self Care | Payer: BLUE CROSS/BLUE SHIELD | Source: Ambulatory Visit

## 2017-03-01 DIAGNOSIS — O09523 Supervision of elderly multigravida, third trimester: Secondary | ICD-10-CM | POA: Diagnosis present

## 2017-03-01 DIAGNOSIS — Z8659 Personal history of other mental and behavioral disorders: Secondary | ICD-10-CM

## 2017-03-01 DIAGNOSIS — O321XX0 Maternal care for breech presentation, not applicable or unspecified: Secondary | ICD-10-CM | POA: Diagnosis present

## 2017-03-01 DIAGNOSIS — Z882 Allergy status to sulfonamides status: Secondary | ICD-10-CM

## 2017-03-01 DIAGNOSIS — O43129 Velamentous insertion of umbilical cord, unspecified trimester: Secondary | ICD-10-CM | POA: Diagnosis present

## 2017-03-01 DIAGNOSIS — Z880 Allergy status to penicillin: Secondary | ICD-10-CM

## 2017-03-01 NOTE — H&P (Signed)
Holly Jackson is a 36 y.o. female presenting for a repeat Cesarean section, G2P1001 @ 40.[redacted] Weeks EGA with history of 1 prior c/s.EDC 03/04/17 by 9.1 wk scan. Recent ultrasound also showed fetus to be in frank breech presentation. Endorses FM and occ ctxs. Denies VB or LOF.  Pregnancy followed at [facility] since 7+1weeks.   Prenatal course significant for:  1. Previous LTCS in 2015 due to NRFHRT - induced at 37.1 wks due to Cholestasis of Pregnancy, on Ursodiol. Had extreme itching of soles of feet at beginning of 2nd trimester this pregnancy; started on Hydroxyzine HCL tabs; bile acids normal x 3 and CMP normal x1. 2. Velamentous insertion of umbilical cord, anterior succenturiate lobe w/o previa. Was doing GROWTH US Q 4 WEEKS & NST Q WEEK FROM 36 WEEKS. 3. Homero FellersFrank Breech presentation on 02/19/17 - desires elective repeat. 4. AMA - normal Panorama. 5. Episode of trace protein, headache, RUQ pain and edema at 34.1 wks - normal BPs, preE labs and PCR. 6. Elevated BMI (34.9); TWG 11-lbs this pregnancy. 7. History of anxiety & depression; symptoms well controlled without medication use.   8. PCN allergy - SOB-mild resp distress. 9. Allergy to sulfonamides - mod-severe rash.  OB History    Gravida Para Term Preterm AB Living   2 1 1     1    SAB TAB Ectopic Multiple Live Births           1    LTCS 09/21/14 @ 37.1 wks by Dr. Sallye OberKulwa 2/2 NRFHRT at 4 cm - IOL due to Cholestasis of Pregnancy, female infant, birthwt 6+4. Past Medical History:  Diagnosis Date  . Anxiety   . Cholestasis of pregnancy   . Vaginal Pap smear, abnormal    pos HPV   Past Surgical History:  Procedure Laterality Date  . ABDOMINOPLASTY    . CESAREAN SECTION N/A 09/21/2014   Procedure: CESAREAN SECTION;  Surgeon: Konrad FelixEma Wakuru Kulwa, MD;  Location: [facility];  Service: Obstetrics;  Laterality: N/A;   Family History: family history includes Miscarriages / Stillbirths in her sister. CVA in her father. Social History:  reports that  she has never smoked. She has never used smokeless tobacco. She reports that she does not drink alcohol or use drugs. Pt is African-American, born [place], and married to [place]. She is of the [place] faith and will accept blood in an emergency. She holds a post graduate degree in Accounting.     Maternal Diabetes: No Genetic Screening: Normal Maternal Ultrasounds/Referrals: Abnormal:  Findings:   Other: Ultrasound 02/19/17 @ 38.1 wks: EFW 7lbs 9 oz; 3422 g (64%tile), Normal fluid (30th%), Posterior with anterior accessory lobe placenta. Frank breech presentation. BPP 8/8. Adnexas unremarkable.  Fetal Ultrasounds or other Referrals:  None Maternal Substance Abuse:  No Significant Maternal Medications:  Meds include: Other: Hydroxyzine HCL prn, Pantoprazole prn, PNV, Tyl prn Significant Maternal Lab Results:  Lab values include: Group B Strep negative Other Comments:  Flu 11/27/16, Tdap 12/12/16  ROS 10 Systems reviewed and are negative for acute change except as noted in the HPI.  History   Last menstrual period 05/20/2016, unknown if currently breastfeeding.   Exam on 02/26/17 at 39.1 wks:  Fetal Monitor Review: Mode: hand-held doppler probe.   Baseline rate: 133 bpm. Cvx: 0/50%. BPP 8/8 in 10 min. AFI normal at 45th%. Posterior placenta w/ anterior accessory lobe.  BP 90/56; Ht: 62"; Wt: 191 lbs.  Physical Exam  Nursing noteand vitalsreviewed. Neurological: She has normal reflexes.  Constitutional: She is oriented to person, place, and time. She appears well-developed and well-nourished.  HENT:  Head: Normocephalic and atraumatic.  Neck: Normal range of motion.  Cardiovascular: Normal rate, regular rhythm and normal heart sounds.  Respiratory: Effort normal and breath sounds normal.  GI: Soft. Bowel sounds are normal. Abdomen is gravid.  Skin: Warm and dry.  Musculoskeletal: Exhibits trace lower extremity edema. Psychiatric: She has a normal mood and affect. Her  behavior is normal.   Prenatal labs: ABO, Rh: O/Positive/-- (07/17 0000) Antibody: Negative (07/17 0000) Rubella: Immune (07/17 0000) RPR: Nonreactive (07/17 0000)  HBsAg: Negative (07/17 0000)  HIV: Non-reactive (07/17 0000)  GBS: Negative (02/13 0000)  Hbg 11.6 at 34.1 wks Pap smear normal w/ neg HPV on 07/31/16 TSH normal 07/31/16 & 11/26/16  Assessment/Plan: 36 y/o P1 here for repeat C/S, frank breech presenting fetus, velamentous insertion of umbilical cord and anterior succenturiate lobe without previa.  Discussed with the patient in the office and also on the day of surgery at the hospital the risks, benefits and alternatives of repeat cesarean delivery, including risks of heavy bleeding and possibly requiring a blood transfusion, infection and damage to organs. Risks of scar tissue formation and rupture of uterus that increase with every subsequent cesarean delivery also discussed. Alternatives of TOLAC and VBAC discussed, but patient declined this option and is not a suitable candidate for TOLAC due to breech presentation. All questions were answered and she consented for the procedure. -Admit to Pre-op -NPO -LR IV  -Gent/Clinda pre-op -SCDs          Sherre Scarlet, CNM for Dr. Dois Davenport Rivard  03/04/17, 05:45 AM

## 2017-03-04 ENCOUNTER — Encounter (HOSPITAL_COMMUNITY)
Admission: RE | Disposition: A | Discharge status: Home / Self Care | Payer: Self-pay | Source: Ambulatory Visit | Attending: Obstetrics and Gynecology

## 2017-03-04 ENCOUNTER — Inpatient Hospital Stay (HOSPITAL_COMMUNITY): Payer: BLUE CROSS/BLUE SHIELD | Admitting: Anesthesiology

## 2017-03-04 ENCOUNTER — Inpatient Hospital Stay (HOSPITAL_COMMUNITY)
Admission: RE | Admit: 2017-03-04 | Discharge: 2017-03-07 | DRG: 765 | Disposition: A | Discharge status: Home / Self Care | Payer: BLUE CROSS/BLUE SHIELD | Source: Ambulatory Visit | Attending: Obstetrics and Gynecology | Admitting: Obstetrics and Gynecology

## 2017-03-04 ENCOUNTER — Encounter (HOSPITAL_COMMUNITY): Payer: Self-pay | Admitting: *Deleted

## 2017-03-04 DIAGNOSIS — O9962 Diseases of the digestive system complicating childbirth: Secondary | ICD-10-CM | POA: Diagnosis not present

## 2017-03-04 DIAGNOSIS — Z880 Allergy status to penicillin: Secondary | ICD-10-CM

## 2017-03-04 DIAGNOSIS — K219 Gastro-esophageal reflux disease without esophagitis: Secondary | ICD-10-CM | POA: Diagnosis present

## 2017-03-04 DIAGNOSIS — Z882 Allergy status to sulfonamides status: Secondary | ICD-10-CM

## 2017-03-04 DIAGNOSIS — F411 Generalized anxiety disorder: Secondary | ICD-10-CM | POA: Diagnosis not present

## 2017-03-04 DIAGNOSIS — O43123 Velamentous insertion of umbilical cord, third trimester: Secondary | ICD-10-CM | POA: Diagnosis not present

## 2017-03-04 DIAGNOSIS — J9811 Atelectasis: Secondary | ICD-10-CM | POA: Diagnosis not present

## 2017-03-04 DIAGNOSIS — O09523 Supervision of elderly multigravida, third trimester: Secondary | ICD-10-CM | POA: Diagnosis present

## 2017-03-04 DIAGNOSIS — O321XX0 Maternal care for breech presentation, not applicable or unspecified: Secondary | ICD-10-CM | POA: Diagnosis not present

## 2017-03-04 DIAGNOSIS — Z3A39 39 weeks gestation of pregnancy: Secondary | ICD-10-CM | POA: Diagnosis not present

## 2017-03-04 DIAGNOSIS — Z3A40 40 weeks gestation of pregnancy: Secondary | ICD-10-CM | POA: Diagnosis not present

## 2017-03-04 DIAGNOSIS — O99344 Other mental disorders complicating childbirth: Secondary | ICD-10-CM | POA: Diagnosis present

## 2017-03-04 DIAGNOSIS — R0789 Other chest pain: Secondary | ICD-10-CM | POA: Diagnosis not present

## 2017-03-04 DIAGNOSIS — O34211 Maternal care for low transverse scar from previous cesarean delivery: Principal | ICD-10-CM | POA: Diagnosis present

## 2017-03-04 DIAGNOSIS — Z8659 Personal history of other mental and behavioral disorders: Secondary | ICD-10-CM

## 2017-03-04 DIAGNOSIS — O43129 Velamentous insertion of umbilical cord, unspecified trimester: Secondary | ICD-10-CM | POA: Diagnosis present

## 2017-03-04 DIAGNOSIS — O34219 Maternal care for unspecified type scar from previous cesarean delivery: Secondary | ICD-10-CM

## 2017-03-04 DIAGNOSIS — O9089 Other complications of the puerperium, not elsewhere classified: Secondary | ICD-10-CM | POA: Diagnosis not present

## 2017-03-04 DIAGNOSIS — L258 Unspecified contact dermatitis due to other agents: Secondary | ICD-10-CM | POA: Diagnosis not present

## 2017-03-04 DIAGNOSIS — Z23 Encounter for immunization: Secondary | ICD-10-CM | POA: Diagnosis not present

## 2017-03-04 DIAGNOSIS — R079 Chest pain, unspecified: Secondary | ICD-10-CM

## 2017-03-04 DIAGNOSIS — Z98891 History of uterine scar from previous surgery: Secondary | ICD-10-CM

## 2017-03-04 LAB — CBC
HCT: 37.5 % (ref 36.0–46.0)
Hemoglobin: 12.6 g/dL (ref 12.0–15.0)
MCH: 26 pg (ref 26.0–34.0)
MCHC: 33.6 g/dL (ref 30.0–36.0)
MCV: 77.5 fL — ABNORMAL LOW (ref 78.0–100.0)
Platelets: 251 10*3/uL (ref 150–400)
RBC: 4.84 MIL/uL (ref 3.87–5.11)
RDW: 16.3 % — AB (ref 11.5–15.5)
WBC: 8.5 10*3/uL (ref 4.0–10.5)

## 2017-03-04 LAB — CREATININE, SERUM
Creatinine, Ser: 0.5 mg/dL (ref 0.44–1.00)
GFR calc non Af Amer: >60 mL/min (ref >60)

## 2017-03-04 LAB — TYPE AND SCREEN
ABO/RH(D): O POS
ANTIBODY SCREEN: NEGATIVE

## 2017-03-04 SURGERY — Surgical Case
Anesthesia: Spinal

## 2017-03-04 MED ORDER — KETOROLAC TROMETHAMINE 30 MG/ML IJ SOLN
INTRAMUSCULAR | Status: AC
  Administered 2017-03-04: 30 mg via INTRAMUSCULAR
  Filled 2017-03-04: qty 1

## 2017-03-04 MED ORDER — FENTANYL CITRATE (PF) 100 MCG/2ML IJ SOLN
INTRAMUSCULAR | Status: AC
  Filled 2017-03-04: qty 2

## 2017-03-04 MED ORDER — BUPIVACAINE-EPINEPHRINE (PF) 0.25% -1:200000 IJ SOLN
INTRAMUSCULAR | Status: AC
  Filled 2017-03-04: qty 30

## 2017-03-04 MED ORDER — DIPHENHYDRAMINE HCL 25 MG PO CAPS: 25.0000 mg | ORAL_CAPSULE | ORAL | Status: DC | PRN

## 2017-03-04 MED ORDER — METOCLOPRAMIDE HCL 5 MG/ML IJ SOLN
INTRAMUSCULAR | Status: DC | PRN
  Administered 2017-03-04: 10 mg via INTRAVENOUS

## 2017-03-04 MED ORDER — PANTOPRAZOLE SODIUM 20 MG PO TBEC
20.0000 mg | DELAYED_RELEASE_TABLET | Freq: Every day | ORAL | Status: DC
  Administered 2017-03-05 – 2017-03-07 (×3): 20 mg via ORAL
  Filled 2017-03-04 (×5): qty 1

## 2017-03-04 MED ORDER — WITCH HAZEL-GLYCERIN EX PADS: 1.0000 "application " | MEDICATED_PAD | CUTANEOUS | Status: DC | PRN

## 2017-03-04 MED ORDER — LACTATED RINGERS IV SOLN
INTRAVENOUS | Status: DC
  Administered 2017-03-04: 22:00:00 via INTRAVENOUS

## 2017-03-04 MED ORDER — PHENYLEPHRINE 8 MG IN D5W 100 ML (0.08MG/ML) PREMIX OPTIME
INJECTION | INTRAVENOUS | Status: DC | PRN
  Administered 2017-03-04: 60 ug/min via INTRAVENOUS

## 2017-03-04 MED ORDER — FENTANYL CITRATE (PF) 100 MCG/2ML IJ SOLN
25.0000 ug | INTRAMUSCULAR | Status: DC | PRN
  Administered 2017-03-04: 25 ug via INTRAVENOUS
  Administered 2017-03-04 (×2): 50 ug via INTRAVENOUS

## 2017-03-04 MED ORDER — KETOROLAC TROMETHAMINE 30 MG/ML IJ SOLN
30.0000 mg | Freq: Four times a day (QID) | INTRAMUSCULAR | Status: AC | PRN
  Administered 2017-03-04: 30 mg via INTRAMUSCULAR

## 2017-03-04 MED ORDER — ENOXAPARIN SODIUM 40 MG/0.4ML ~~LOC~~ SOLN
40.0000 mg | SUBCUTANEOUS | Status: DC
  Administered 2017-03-05 – 2017-03-07 (×3): 40 mg via SUBCUTANEOUS
  Filled 2017-03-04 (×4): qty 0.4

## 2017-03-04 MED ORDER — METHYLERGONOVINE MALEATE 0.2 MG/ML IJ SOLN: 0.2000 mg | INTRAMUSCULAR | Status: DC | PRN

## 2017-03-04 MED ORDER — GENTAMICIN SULFATE 40 MG/ML IJ SOLN
INTRAVENOUS | Status: AC
  Administered 2017-03-04: 114 mL via INTRAVENOUS
  Filled 2017-03-04: qty 8

## 2017-03-04 MED ORDER — DIBUCAINE 1 % RE OINT: 1.0000 "application " | TOPICAL_OINTMENT | RECTAL | Status: DC | PRN

## 2017-03-04 MED ORDER — PRENATAL MULTIVITAMIN CH
1.0000 | ORAL_TABLET | Freq: Every day | ORAL | Status: DC
  Administered 2017-03-05 – 2017-03-06 (×2): 1 via ORAL
  Filled 2017-03-04 (×3): qty 1

## 2017-03-04 MED ORDER — METHYLERGONOVINE MALEATE 0.2 MG PO TABS: 0.2000 mg | ORAL_TABLET | ORAL | Status: DC | PRN

## 2017-03-04 MED ORDER — SCOPOLAMINE 1 MG/3DAYS TD PT72
1.0000 | MEDICATED_PATCH | Freq: Once | TRANSDERMAL | Status: DC
Start: 2017-03-04 — End: 2017-03-04

## 2017-03-04 MED ORDER — ONDANSETRON HCL 4 MG/2ML IJ SOLN
INTRAMUSCULAR | Status: DC | PRN
  Administered 2017-03-04: 4 mg via INTRAVENOUS

## 2017-03-04 MED ORDER — MEASLES, MUMPS & RUBELLA VAC ~~LOC~~ INJ: 0.5000 mL | INJECTION | Freq: Once | SUBCUTANEOUS | Status: DC

## 2017-03-04 MED ORDER — OXYCODONE-ACETAMINOPHEN 5-325 MG PO TABS
2.0000 | ORAL_TABLET | ORAL | Status: DC | PRN
  Administered 2017-03-05 – 2017-03-07 (×5): 2 via ORAL
  Filled 2017-03-04 (×5): qty 2

## 2017-03-04 MED ORDER — NALBUPHINE HCL 10 MG/ML IJ SOLN: 5.0000 mg | Freq: Once | INTRAMUSCULAR | Status: AC | PRN

## 2017-03-04 MED ORDER — NALBUPHINE HCL 10 MG/ML IJ SOLN
INTRAMUSCULAR | Status: AC
  Filled 2017-03-04: qty 1

## 2017-03-04 MED ORDER — TETANUS-DIPHTH-ACELL PERTUSSIS 5-2.5-18.5 LF-MCG/0.5 IM SUSP: 0.5000 mL | Freq: Once | INTRAMUSCULAR | Status: DC

## 2017-03-04 MED ORDER — DIPHENHYDRAMINE HCL 50 MG/ML IJ SOLN
INTRAMUSCULAR | Status: DC | PRN
  Administered 2017-03-04: 12.5 mg via INTRAVENOUS
  Administered 2017-03-04: 25 mg via INTRAVENOUS
  Administered 2017-03-04: 12.5 mg via INTRAVENOUS

## 2017-03-04 MED ORDER — SODIUM CHLORIDE 0.9% FLUSH: 3.0000 mL | INTRAVENOUS | Status: DC | PRN

## 2017-03-04 MED ORDER — DEXAMETHASONE SODIUM PHOSPHATE 10 MG/ML IJ SOLN
INTRAMUSCULAR | Status: DC | PRN
  Administered 2017-03-04: 10 mg via INTRAVENOUS

## 2017-03-04 MED ORDER — NALBUPHINE HCL 10 MG/ML IJ SOLN: 5.0000 mg | INTRAMUSCULAR | Status: DC | PRN

## 2017-03-04 MED ORDER — SIMETHICONE 80 MG PO CHEW
80.0000 mg | CHEWABLE_TABLET | Freq: Three times a day (TID) | ORAL | Status: DC
  Administered 2017-03-05 – 2017-03-07 (×6): 80 mg via ORAL
  Filled 2017-03-04 (×7): qty 1

## 2017-03-04 MED ORDER — COCONUT OIL OIL: 1.0000 "application " | TOPICAL_OIL | Status: DC | PRN

## 2017-03-04 MED ORDER — METOCLOPRAMIDE HCL 5 MG/ML IJ SOLN
INTRAMUSCULAR | Status: AC
  Filled 2017-03-04: qty 2

## 2017-03-04 MED ORDER — ACETAMINOPHEN 325 MG PO TABS: 650.0000 mg | ORAL_TABLET | ORAL | Status: DC | PRN

## 2017-03-04 MED ORDER — FENTANYL CITRATE (PF) 100 MCG/2ML IJ SOLN
INTRAMUSCULAR | Status: DC | PRN
  Administered 2017-03-04: 10 ug via INTRATHECAL

## 2017-03-04 MED ORDER — CEFAZOLIN SODIUM-DEXTROSE 2-4 GM/100ML-% IV SOLN
INTRAVENOUS | Status: AC
  Filled 2017-03-04: qty 100

## 2017-03-04 MED ORDER — MENTHOL 3 MG MT LOZG: 1.0000 | LOZENGE | OROMUCOSAL | Status: DC | PRN

## 2017-03-04 MED ORDER — NALOXONE HCL 0.4 MG/ML IJ SOLN: 0.4000 mg | INTRAMUSCULAR | Status: DC | PRN

## 2017-03-04 MED ORDER — KETOROLAC TROMETHAMINE 30 MG/ML IJ SOLN: 30.0000 mg | Freq: Four times a day (QID) | INTRAMUSCULAR | Status: AC | PRN

## 2017-03-04 MED ORDER — BUPIVACAINE HCL (PF) 0.25 % IJ SOLN
INTRAMUSCULAR | Status: AC
  Filled 2017-03-04: qty 30

## 2017-03-04 MED ORDER — LACTATED RINGERS IV SOLN
INTRAVENOUS | Status: DC
  Administered 2017-03-04 (×3): via INTRAVENOUS

## 2017-03-04 MED ORDER — PHENYLEPHRINE HCL 10 MG/ML IJ SOLN
INTRAMUSCULAR | Status: DC | PRN
  Administered 2017-03-04: 40 ug via INTRAVENOUS
  Administered 2017-03-04: 80 ug via INTRAVENOUS

## 2017-03-04 MED ORDER — SIMETHICONE 80 MG PO CHEW
80.0000 mg | CHEWABLE_TABLET | ORAL | Status: DC
  Administered 2017-03-04 – 2017-03-06 (×3): 80 mg via ORAL
  Filled 2017-03-04 (×3): qty 1

## 2017-03-04 MED ORDER — BUPIVACAINE HCL (PF) 0.25 % IJ SOLN
INTRAMUSCULAR | Status: DC | PRN
  Administered 2017-03-04: 20 mL

## 2017-03-04 MED ORDER — NALBUPHINE HCL 10 MG/ML IJ SOLN
5.0000 mg | Freq: Once | INTRAMUSCULAR | Status: AC | PRN
  Administered 2017-03-04: 5 mg via SUBCUTANEOUS

## 2017-03-04 MED ORDER — SCOPOLAMINE 1 MG/3DAYS TD PT72
1.0000 | MEDICATED_PATCH | TRANSDERMAL | Status: DC
  Administered 2017-03-04: 1.5 mg via TRANSDERMAL
  Filled 2017-03-04: qty 1

## 2017-03-04 MED ORDER — SOD CITRATE-CITRIC ACID 500-334 MG/5ML PO SOLN
30.0000 mL | Freq: Once | ORAL | Status: AC
Start: 2017-03-04 — End: 2017-03-04
  Administered 2017-03-04: 30 mL via ORAL
  Filled 2017-03-04: qty 15

## 2017-03-04 MED ORDER — MORPHINE SULFATE (PF) 0.5 MG/ML IJ SOLN
INTRAMUSCULAR | Status: DC | PRN
  Administered 2017-03-04: .2 mg via INTRATHECAL

## 2017-03-04 MED ORDER — ZOLPIDEM TARTRATE 5 MG PO TABS
5.0000 mg | ORAL_TABLET | Freq: Every evening | ORAL | Status: DC | PRN
  Administered 2017-03-06: 5 mg via ORAL
  Filled 2017-03-04 (×2): qty 1

## 2017-03-04 MED ORDER — OXYTOCIN 40 UNITS IN LACTATED RINGERS INFUSION - SIMPLE MED: 2.5000 [IU]/h | INTRAVENOUS | Status: AC

## 2017-03-04 MED ORDER — DEXAMETHASONE SODIUM PHOSPHATE 10 MG/ML IJ SOLN
INTRAMUSCULAR | Status: AC
  Filled 2017-03-04: qty 1

## 2017-03-04 MED ORDER — OXYTOCIN 40 UNITS IN LACTATED RINGERS INFUSION - SIMPLE MED
INTRAVENOUS | Status: DC | PRN
  Administered 2017-03-04: 40 [IU] via INTRAVENOUS

## 2017-03-04 MED ORDER — PHENYLEPHRINE 8 MG IN D5W 100 ML (0.08MG/ML) PREMIX OPTIME
INJECTION | INTRAVENOUS | Status: AC
Start: 2017-03-04 — End: 2017-03-04
  Filled 2017-03-04: qty 100

## 2017-03-04 MED ORDER — SIMETHICONE 80 MG PO CHEW
80.0000 mg | CHEWABLE_TABLET | ORAL | Status: DC | PRN
  Administered 2017-03-07: 80 mg via ORAL
  Filled 2017-03-04: qty 1

## 2017-03-04 MED ORDER — HYDROXYZINE HCL 25 MG PO TABS
25.0000 mg | ORAL_TABLET | Freq: Every day | ORAL | Status: DC | PRN
  Filled 2017-03-04: qty 1

## 2017-03-04 MED ORDER — BUPIVACAINE HCL (PF) 0.25 % IJ SOLN
INTRAMUSCULAR | Status: AC
  Filled 2017-03-04: qty 20

## 2017-03-04 MED ORDER — FERROUS SULFATE 325 (65 FE) MG PO TABS
325.0000 mg | ORAL_TABLET | Freq: Two times a day (BID) | ORAL | Status: DC
  Administered 2017-03-05 – 2017-03-07 (×5): 325 mg via ORAL
  Filled 2017-03-04 (×6): qty 1

## 2017-03-04 MED ORDER — LACTATED RINGERS IV SOLN
INTRAVENOUS | Status: DC | PRN
  Administered 2017-03-04: 12:00:00 via INTRAVENOUS

## 2017-03-04 MED ORDER — BUPIVACAINE IN DEXTROSE 0.75-8.25 % IT SOLN
INTRATHECAL | Status: DC | PRN
  Administered 2017-03-04: 1.5 mL via INTRATHECAL

## 2017-03-04 MED ORDER — ONDANSETRON HCL 4 MG/2ML IJ SOLN: 4.0000 mg | Freq: Three times a day (TID) | INTRAMUSCULAR | Status: DC | PRN

## 2017-03-04 MED ORDER — MEPERIDINE HCL 25 MG/ML IJ SOLN
6.2500 mg | INTRAMUSCULAR | Status: DC | PRN
Start: 2017-03-04 — End: 2017-03-04

## 2017-03-04 MED ORDER — IBUPROFEN 600 MG PO TABS
600.0000 mg | ORAL_TABLET | Freq: Four times a day (QID) | ORAL | Status: DC
  Administered 2017-03-04 – 2017-03-07 (×9): 600 mg via ORAL
  Filled 2017-03-04 (×9): qty 1

## 2017-03-04 MED ORDER — NALOXONE HCL 2 MG/2ML IJ SOSY
1.0000 ug/kg/h | PREFILLED_SYRINGE | INTRAVENOUS | Status: DC | PRN
  Filled 2017-03-04: qty 2

## 2017-03-04 MED ORDER — MORPHINE SULFATE (PF) 0.5 MG/ML IJ SOLN
INTRAMUSCULAR | Status: AC
  Filled 2017-03-04: qty 10

## 2017-03-04 MED ORDER — DIPHENHYDRAMINE HCL 50 MG/ML IJ SOLN: 12.5000 mg | INTRAMUSCULAR | Status: DC | PRN

## 2017-03-04 MED ORDER — SENNOSIDES-DOCUSATE SODIUM 8.6-50 MG PO TABS
2.0000 | ORAL_TABLET | ORAL | Status: DC
  Administered 2017-03-04 – 2017-03-06 (×3): 2 via ORAL
  Filled 2017-03-04 (×3): qty 2

## 2017-03-04 MED ORDER — DIPHENHYDRAMINE HCL 25 MG PO CAPS
25.0000 mg | ORAL_CAPSULE | Freq: Four times a day (QID) | ORAL | Status: DC | PRN
  Administered 2017-03-06: 25 mg via ORAL
  Filled 2017-03-04: qty 1

## 2017-03-04 MED ORDER — OXYCODONE-ACETAMINOPHEN 5-325 MG PO TABS
1.0000 | ORAL_TABLET | ORAL | Status: DC | PRN
  Administered 2017-03-04 – 2017-03-06 (×4): 1 via ORAL
  Filled 2017-03-04 (×5): qty 1

## 2017-03-04 MED ORDER — ONDANSETRON HCL 4 MG/2ML IJ SOLN
INTRAMUSCULAR | Status: AC
Start: 2017-03-04 — End: 2017-03-04
  Filled 2017-03-04: qty 2

## 2017-03-04 SURGICAL SUPPLY — 36 items
BENZOIN TINCTURE PRP APPL 2/3 (GAUZE/BANDAGES/DRESSINGS) ×2 IMPLANT
BOOTIES KNEE HIGH SLOAN (MISCELLANEOUS) ×4 IMPLANT
CHLORAPREP W/TINT 26ML (MISCELLANEOUS) ×2 IMPLANT
CLAMP CORD UMBIL (MISCELLANEOUS) IMPLANT
CLOTH BEACON ORANGE TIMEOUT ST (SAFETY) ×2 IMPLANT
CLSR STERI-STRIP ANTIMIC 1/2X4 (GAUZE/BANDAGES/DRESSINGS) ×2 IMPLANT
DRAIN JACKSON PRT FLT 10 (DRAIN) IMPLANT
DRSG OPSITE POSTOP 4X10 (GAUZE/BANDAGES/DRESSINGS) ×2 IMPLANT
ELECT REM PT RETURN 9FT ADLT (ELECTROSURGICAL) ×2
ELECTRODE REM PT RTRN 9FT ADLT (ELECTROSURGICAL) ×1 IMPLANT
EVACUATOR SILICONE 100CC (DRAIN) IMPLANT
EXTRACTOR VACUUM M CUP 4 TUBE (SUCTIONS) IMPLANT
GLOVE BIOGEL PI IND STRL 7.0 (GLOVE) ×2 IMPLANT
GLOVE BIOGEL PI INDICATOR 7.0 (GLOVE) ×2
GLOVE ECLIPSE 6.5 STRL STRAW (GLOVE) ×2 IMPLANT
GOWN STRL REUS W/TWL LRG LVL3 (GOWN DISPOSABLE) ×4 IMPLANT
KIT ABG SYR 3ML LUER SLIP (SYRINGE) IMPLANT
NEEDLE HYPO 22GX1.5 SAFETY (NEEDLE) ×2 IMPLANT
NEEDLE HYPO 25X5/8 SAFETYGLIDE (NEEDLE) IMPLANT
NS IRRIG 1000ML POUR BTL (IV SOLUTION) ×4 IMPLANT
PACK C SECTION WH (CUSTOM PROCEDURE TRAY) ×2 IMPLANT
PAD ABD DERMACEA PRESS 5X9 (GAUZE/BANDAGES/DRESSINGS) ×2 IMPLANT
PAD OB MATERNITY 4.3X12.25 (PERSONAL CARE ITEMS) ×2 IMPLANT
PENCIL SMOKE EVAC W/HOLSTER (ELECTROSURGICAL) ×2 IMPLANT
RTRCTR C-SECT PINK 25CM LRG (MISCELLANEOUS) ×2 IMPLANT
STRIP CLOSURE SKIN 1/2X4 (GAUZE/BANDAGES/DRESSINGS) ×2 IMPLANT
SUT MNCRL AB 3-0 PS2 27 (SUTURE) ×2 IMPLANT
SUT SILK 2 0 FSL 18 (SUTURE) IMPLANT
SUT VIC AB 0 CTX 36 (SUTURE) ×2
SUT VIC AB 0 CTX36XBRD ANBCTRL (SUTURE) ×2 IMPLANT
SUT VIC AB 1 CT1 36 (SUTURE) ×4 IMPLANT
SUT VIC AB 2-0 CT1 27 (SUTURE)
SUT VIC AB 2-0 CT1 TAPERPNT 27 (SUTURE) IMPLANT
SYR 20CC LL (SYRINGE) ×2 IMPLANT
TOWEL OR 17X24 6PK STRL BLUE (TOWEL DISPOSABLE) ×2 IMPLANT
TRAY FOLEY CATH SILVER 14FR (SET/KITS/TRAYS/PACK) ×2 IMPLANT

## 2017-03-04 NOTE — Transfer of Care (Signed)
Immediate Anesthesia Transfer of Care Note  Patient: Holly Jackson  Procedure(s) Performed: Procedure(s): CESAREAN SECTION (N/A)  Patient Location: PACU  Anesthesia Type:Spinal  Level of Consciousness: awake, alert  and oriented  Airway & Oxygen Therapy: Patient Spontanous Breathing  Post-op Assessment: Report given to RN and Post -op Vital signs reviewed and stable  Post vital signs: Reviewed and stable  Last Vitals:  Vitals:   03/04/17 0807  BP: 122/83  Pulse: (!) 104  Resp: 20  Temp: 36.6 C    Last Pain:  Vitals:   03/04/17 0807  TempSrc: Oral         Complications: No apparent anesthesia complications

## 2017-03-04 NOTE — Op Note (Signed)
Preoperative diagnosis: Intrauterine pregnancy at 40 weeks, previous cesarean section and frank breech presentation   Post operative diagnosis: Same  Anesthesia: Spinal  Anesthesiologist: Dr. Sampson GoonFitzgerald  Procedure: Repeat low transverse cesarean section  Surgeon: Dr. Dois DavenportSandra Rainee Sweatt  Assistant: none  Estimated blood loss: 800 cc  Procedure:  After being informed of the planned procedure and possible complications including bleeding, infection, injury to other organs, informed consent is obtained. The patient is taken to OR #1 and given spinal anesthesia without complication. She is placed in the dorsal decubitus position with the pelvis tilted to the left. She is then prepped and draped in a sterile fashion. A Foley catheter is inserted in her bladder.  After assessing adequate level of anesthesia, we infiltrate the suprapubic area with 20 cc of Marcaine 0.25 and perform a Pfannenstiel incision, over the previous abdominoplasty incision,and then  brought down sharply to the fascia. The fascia is entered in a low transverse fashion. Linea alba is dissected. Peritoneum is entered in a midline fashion. An Alexis retractor is easily positioned.   The myometrium is then entered in a low transverse fashion, 2 cm above the vesico-uterine junction ; first with knife and then extended bluntly. Amniotic fluid is clear. We assist the birth of a female  infant in frank breech presentation.  The baby is delivered. Mouth and nose are suctioned.The cord is clamped and sectioned. The baby is given to the neonatologist present in the room.  10 cc of blood is drawn from the umbilical vein.The placenta is allowed to deliver spontaneously. It is complete with a succenturiate lobe and a marginal cord insertion.The cord has 3 vessels. Uterine revision is negative.  We proceed with closure of the myometrium in 2 layers: First with a running locked suture of 0 Vicryl, then with a Lembert suture of 0 Vicryl  imbricating the first one. Hemostasis is completed with cauterization on peritoneal edges.  Both paracolic gutters are cleaned. Both tubes and ovaries are assessed and normal. The pelvis is profusely irrigated with warm saline to confirm a satisfactory hemostasis.  Retractors and sponges are removed. Under fascia hemostasis is completed with cauterization.The old scar can now be removed with a more relaxed abdominal wall.  The fascia is then closed with 2 running sutures of 0 Vicryl meeting midline. The wound is irrigated with warm saline and hemostasis is completed with cauterization. The subcuticular layer is closed with multiple 0-Plain interrupted sutures.The skin is closed with a subcuticular suture of 3-0 Monocryl and Steri-Strips.  Instrument and sponge count is complete x2. Estimated blood loss is 800 cc.  The procedure is well tolerated by the patient who is taken to recovery room in a well and stable condition.  female baby named Holly Jackson was born at 11:31 and received an Apgar of 8  at 1 minute and 8 at 5 minutes.    Specimen: Placenta sent to L & D   Holly Jackson A MD 3/12/20188:54 AM

## 2017-03-04 NOTE — Interval H&P Note (Signed)
History and Physical Interval Note:  03/04/2017 8:54 AM  Holly Jackson  has presented today for surgery, with the diagnosis of Prior Cesarean   The various methods of treatment have been discussed with the patient and family. After consideration of risks, benefits and other options for treatment, the patient has consented to  Procedure(s): CESAREAN SECTION (N/A) as a surgical intervention .  The patient's history has been reviewed, patient examined, no change in status, stable for surgery.  I have reviewed the patient's chart and labs.  Questions were answered to the patient's satisfaction.     Leiana Rund A

## 2017-03-04 NOTE — Lactation Note (Signed)
This note was copied from a baby's chart. Lactation Consultation Note  Experienced BF mother reports difficulty latching baby to the left breast.  Assisted with latch but noticed that baby is not suckling deeply or swallowing.  Performed tongue exercises and jaw massage with some improvement. Repositioned baby on the breast and mom reported suckle was stronger.  Encouraged her to use breast compression to aid in transfer.  A few swallows were noted.  IBCLC noted that feedings have been lasting up to 60 minutes. Follow-up tomorrow to see if there is any improvement. Information given on support groups and outpatient services.  Patient Name: Holly Jackson ZOXWR'UToday's Date: 03/04/2017 Reason for consult: Initial assessment;Difficult latch   Maternal Data Has patient been taught Hand Expression?: Yes Does the patient have breastfeeding experience prior to this delivery?: Yes  Feeding Feeding Type: Breast Fed  LATCH Score/Interventions Latch: Repeated attempts needed to sustain latch, nipple held in mouth throughout feeding, stimulation needed to elicit sucking reflex.  Audible Swallowing: None  Type of Nipple: Everted at rest and after stimulation  Comfort (Breast/Nipple): Soft / non-tender     Hold (Positioning): Assistance needed to correctly position infant at breast and maintain latch.  LATCH Score: 6  Lactation Tools Discussed/Used     Consult Status Consult Status: Follow-up Date: 03/05/17 Follow-up type: In-patient    Soyla DryerJoseph, Susie Ehresman 03/04/2017, 11:37 PM

## 2017-03-04 NOTE — Anesthesia Procedure Notes (Signed)
Spinal  Staffing Anesthesiologist: Marcene DuosFITZGERALD, Theodis Kinsel Performed: anesthesiologist  Preanesthetic Checklist Completed: patient identified, site marked, surgical consent, pre-op evaluation, timeout performed, IV checked, risks and benefits discussed and monitors and equipment checked Spinal Block Patient position: sitting Prep: site prepped and draped and DuraPrep Patient monitoring: blood pressure, continuous pulse ox and heart rate Location: L4-5 Injection technique: single-shot Needle Needle type: Pencan  Needle gauge: 24 G Needle length: 9 cm Needle insertion depth: 7 cm Assessment Sensory level: T6

## 2017-03-04 NOTE — Anesthesia Preprocedure Evaluation (Signed)
Anesthesia Evaluation  Patient identified by MRN, date of birth, ID band Patient awake    Reviewed: Allergy & Precautions, NPO status , Patient's Chart, lab work & pertinent test results  Airway Mallampati: II  TM Distance: >3 FB Neck ROM: Full    Dental   Pulmonary neg pulmonary ROS,    breath sounds clear to auscultation       Cardiovascular negative cardio ROS   Rhythm:Regular Rate:Normal     Neuro/Psych Anxiety negative neurological ROS     GI/Hepatic Neg liver ROS, GERD  ,  Endo/Other  negative endocrine ROS  Renal/GU negative Renal ROS     Musculoskeletal   Abdominal   Peds  Hematology negative hematology ROS (+)   Anesthesia Other Findings   Reproductive/Obstetrics (+) Pregnancy                             Anesthesia Physical Anesthesia Plan  ASA: II  Anesthesia Plan: Spinal   Post-op Pain Management:    Induction:   Airway Management Planned: Natural Airway  Additional Equipment:   Intra-op Plan:   Post-operative Plan:   Informed Consent: I have reviewed the patients History and Physical, chart, labs and discussed the procedure including the risks, benefits and alternatives for the proposed anesthesia with the patient or authorized representative who has indicated his/her understanding and acceptance.     Plan Discussed with: CRNA  Anesthesia Plan Comments:         Anesthesia Quick Evaluation

## 2017-03-04 NOTE — Anesthesia Postprocedure Evaluation (Signed)
Anesthesia Post Note  Patient: Holly Jackson  Procedure(s) Performed: Procedure(s) (LRB): CESAREAN SECTION (N/A)  Patient location during evaluation: PACU Anesthesia Type: Spinal Level of consciousness: awake and alert Pain management: pain level controlled Vital Signs Assessment: post-procedure vital signs reviewed and stable Respiratory status: spontaneous breathing and respiratory function stable Cardiovascular status: blood pressure returned to baseline and stable Postop Assessment: spinal receding Anesthetic complications: no        Last Vitals:  Vitals:   03/04/17 1409 03/04/17 1417  BP:  108/75  Pulse: 72 69  Resp: 17 18  Temp:  36.4 C    Last Pain:  Vitals:   03/04/17 1417  TempSrc: Oral  PainSc:    Pain Goal:                 Kennieth RadFitzgerald, Rashia Mckesson E

## 2017-03-05 ENCOUNTER — Encounter (HOSPITAL_COMMUNITY): Payer: Self-pay | Admitting: Obstetrics and Gynecology

## 2017-03-05 LAB — CBC
HCT: 32.7 % — ABNORMAL LOW (ref 36.0–46.0)
Hemoglobin: 11.1 g/dL — ABNORMAL LOW (ref 12.0–15.0)
MCH: 26.3 pg (ref 26.0–34.0)
MCHC: 33.9 g/dL (ref 30.0–36.0)
MCV: 77.5 fL — ABNORMAL LOW (ref 78.0–100.0)
PLATELETS: 231 10*3/uL (ref 150–400)
RBC: 4.22 MIL/uL (ref 3.87–5.11)
RDW: 16.6 % — AB (ref 11.5–15.5)
WBC: 12.2 10*3/uL — AB (ref 4.0–10.5)

## 2017-03-05 LAB — RPR: RPR Ser Ql: NONREACTIVE

## 2017-03-05 NOTE — Progress Notes (Signed)
Patient did not void in the cylinder. There wasn't one put on the toliet for patient to void in. Patient told to ambulate halls and use incentive spirometer. Patient told to breast feed infant then supplement. Patient voided x1 unmeasured. Patient told to walk halls and told to call out for pain meds if needed.

## 2017-03-05 NOTE — Progress Notes (Addendum)
Subjective: Postpartum Day 1: Cesarean Delivery repeat with breech presentation Patient reports incisional pain, + flatus and no problems voiding.  Breastfeeding going well.    Objective: Vital signs in last 24 hours: Temp:  [97.6 F (36.4 C)-98.6 F (37 C)] 98.5 F (36.9 C) (03/13 0304) Pulse Rate:  [68-87] 86 (03/13 0648) Resp:  [12-39] 16 (03/13 0648) BP: (92-134)/(51-75) 92/51 (03/13 0648) SpO2:  [94 %-100 %] 95 % (03/13 0648) Weight:  [85.3 kg (188 lb)] 85.3 kg (188 lb) (03/12 0847)  Physical Exam:  General: alert, cooperative and no distress  Heart RRR Lungs clear Abd soft slightly distended BS+ Lochia: appropriate Uterine Fundus: firm Incision: covered and dry DVT Evaluation: No evidence of DVT seen on physical exam. Negative Homan's sign.   Recent Labs  03/04/17 0815 03/05/17 0549  HGB 12.6 11.1*  HCT 37.5 32.7*    Assessment/Plan: Status post Cesarean section. Doing well postoperatively.  Continue current care.  Plans on discharge tomorrow.  Undecided about birth control.  Henderson Newcomerancy Jean Prothero 03/05/2017, 8:26 AM   I saw and examined patient at bedside and agree with above findings, assessment and plan by [facility] Prothe[facility].  Patient may shower then remove the dry pressure dressing.  Dr. Sallye OberKulwa.

## 2017-03-05 NOTE — Lactation Note (Addendum)
This note was copied from a baby's chart. Lactation Consultation Note  Patient Name: Holly Jackson UJWJX'BToday's Date: 03/05/2017 Reason for consult: Follow-up assessment  Mom wanted help b/c "[name]" was no longer showing interest in the breast since bottle-feeding had begun. [name] was showing mild feeding cues & she was put to the bare breast. She did not latch & did not seem to know what to do. A nipple shield (size 24) was applied & prefilled with a small amount of formula. Infant began to latch & suckle. Parents know how to prefill nipple shield. Mom pleased. Mom understands importance of seeing colostrum in nipple shield when infant releases latch.   Lactation to f/u tomorrow.   Parents report that it took about 1 week for her milk to come to volume with her 1st child (born at 2037 weeks). Once it did, she had an abundant supply. Mom reports being able to pump 48 oz/day in addition to putting that child to the breast.    Lurline HareRichey, Bryona Foxworthy [facility] 03/05/2017, 11:20 PM

## 2017-03-05 NOTE — Lactation Note (Signed)
This note was copied from a baby's chart. Lactation Consultation Note  Patient Name: Holly Kandice Moosnn Gicira-Strupp BJYNW'GToday's Date: 03/05/2017 Reason for consult: Follow-up assessment  Mom has visitors in room. Mom has my # to call when ready for consult.  Lurline HareRichey, Eldena Dede [facility] 03/05/2017, 4:15 PM

## 2017-03-05 NOTE — Progress Notes (Signed)
Dr. Wynelle LinkKuwulal updated on patients status and BP this am. No further orders.

## 2017-03-06 NOTE — Progress Notes (Signed)
Patient encouraged to ask for more pain meds today to help pain. Encouraged to walk halls. Patient is passing flatus.

## 2017-03-06 NOTE — Lactation Note (Signed)
This note was copied from a baby's chart. Lactation Consultation Note  Patient Name: Holly Jackson ZOXWR'UToday's Date: 03/06/2017 Reason for consult: Follow-up assessment Baby at 56 hr of life. Upon entry Mom was bottle feeding formula. She stated several times that baby will latch but not suck. She requested DEBP because "my milk is coming in". She desires to pump to feed. Lactation phone number on the white board if mom want help at the next feeding with latch. She will pump q3hr and offer her milk per volume guidelines. If she does not have enough expressed milk she will offer formula. She is aware of lactation services and support group.   Maternal Data    Feeding Feeding Type: Formula Nipple Type: Slow - flow  LATCH Score/Interventions                      Lactation Tools Discussed/Used Pump Review: Setup, frequency, and cleaning;Milk Storage;Other (comment) (pump settings) Initiated by:: ES Date initiated:: 03/06/17   Consult Status Consult Status: Follow-up Date: 03/07/17 Follow-up type: In-patient    Rulon Eisenmengerlizabeth E Chriselda Leppert 03/06/2017, 8:02 PM

## 2017-03-06 NOTE — Progress Notes (Signed)
Subjective: Postpartum Day 2: Cesarean Delivery repeat breech presentation Patient reports incisional pain, tolerating PO, + flatus and no problems voiding.  Ambulating, using pain medication, wishes to go home tomorrow.  Baby having trouble with latch so supplementing with formula.  Objective: Vital signs in last 24 hours: Temp:  [97.9 F (36.6 C)-98 F (36.7 C)] 97.9 F (36.6 C) (03/14 [id number]) Pulse Rate:  [84-89] 84 (03/14 0614) Resp:  [18] 18 (03/14 0614) BP: (100-114)/(55-59) 100/55 (03/14 [id number])  Physical Exam:  General: alert, cooperative and no distress Lochia: appropriate Uterine Fundus: firm Incision: healing well, honeycomb dressing on with old blood noted on right side of dressing DVT Evaluation: No evidence of DVT seen on physical exam. Negative Homan's sign.   Recent Labs  03/04/17 0815 03/05/17 0549  HGB 12.6 11.1*  HCT 37.5 32.7*    Assessment/Plan: Status post Cesarean section. Doing well postoperatively.  Continue current care.  Plans on discharge tomorrow.  Will work with lactation regarding breastfeeding.  Undecided about birth control.  Holly Jackson 03/06/2017, 8:23 AM

## 2017-03-07 ENCOUNTER — Inpatient Hospital Stay (HOSPITAL_COMMUNITY): Payer: BLUE CROSS/BLUE SHIELD

## 2017-03-07 DIAGNOSIS — J9811 Atelectasis: Secondary | ICD-10-CM

## 2017-03-07 MED ORDER — ZOLPIDEM TARTRATE 5 MG PO TABS: 5.0000 mg | ORAL_TABLET | Freq: Every evening | ORAL | 0 refills | Status: DC | PRN

## 2017-03-07 MED ORDER — SOD CITRATE-CITRIC ACID 500-334 MG/5ML PO SOLN
30.0000 mL | Freq: Once | ORAL | Status: AC
  Administered 2017-03-07: 30 mL via ORAL
  Filled 2017-03-07: qty 15

## 2017-03-07 MED ORDER — IBUPROFEN 600 MG PO TABS: 600.0000 mg | ORAL_TABLET | Freq: Four times a day (QID) | ORAL | 2 refills | Status: DC

## 2017-03-07 MED ORDER — OXYCODONE-ACETAMINOPHEN 5-325 MG PO TABS: 2.0000 | ORAL_TABLET | Freq: Four times a day (QID) | ORAL | 0 refills | Status: DC | PRN

## 2017-03-07 MED ORDER — HYDROCORTISONE 1 % EX CREA
TOPICAL_CREAM | Freq: Three times a day (TID) | CUTANEOUS | Status: DC
  Administered 2017-03-07: 11:00:00 via TOPICAL
  Filled 2017-03-07: qty 28

## 2017-03-07 MED ORDER — CYCLOBENZAPRINE HCL 10 MG PO TABS
10.0000 mg | ORAL_TABLET | Freq: Three times a day (TID) | ORAL | Status: DC | PRN
  Administered 2017-03-07: 10 mg via ORAL
  Filled 2017-03-07 (×2): qty 1

## 2017-03-07 MED ORDER — CYCLOBENZAPRINE HCL 10 MG PO TABS: 10.0000 mg | ORAL_TABLET | Freq: Three times a day (TID) | ORAL | 0 refills | Status: DC | PRN

## 2017-03-07 NOTE — Plan of Care (Signed)
Problem: Coping: Goal: Ability to identify and utilize available resources and services will improve Outcome: Completed/Met Date Met: 03/07/17 Patient was instructed to call provider if "chest pain" or other complications occur following discharge. Lactation has provided patient with resources to support her with breastfeeding.

## 2017-03-07 NOTE — Plan of Care (Signed)
Problem: Physical Regulation: Goal: Will remain free from infection Outcome: Completed/Met Date Met: 03/07/17 Patient complained of chest discomfort especially with swallowing or moving. Denied SOB, patient is calm and talkative. Patient was evaluated by CNM and CNM consulted MD. (see notes) Results of chest xray were reviewed with patient. Discharge order. Will re-educate the use of the incentive spirometer to facilitate deep breathing exercise and frequent ambulation following discharge to home.

## 2017-03-07 NOTE — Progress Notes (Signed)
Holly Jackson [id number]  Subjective: Postpartum Day 3: Repeat C/S, breech presentation Patient up ad lib, reports no syncope or dizziness.  C/o "soreness in sternal area" with swallowing and when lying down.  Denies SOB, radiating chest or arm pain, N/V, reflux, fever, or any other sx.  No pain on inspiration/expiration. Feeding:  Breast and bottle Contraceptive plan:  Undecided  On Protonix daily.  Patient concerned about risk of PE ("looked it up on Google"). Taking Percocet 2 tabs q 4-6 for uterine cramping and incisional pain.  Objective: Temp:  [98 F (36.7 C)-98.3 F (36.8 C)] 98 F (36.7 C) (03/15 0651) Pulse Rate:  [88-91] 88 (03/15 0651) Resp:  [18] 18 (03/15 0651) BP: (107-115)/(58-71) 107/58 (03/15 0651)   Vitals:   03/05/17 1806 03/06/17 0614 03/06/17 1818 03/07/17 0651  BP: (!) 114/59 (!) 100/55 115/71 (!) 107/58  Pulse: 89 84 91 88  Resp: 18 18 18 18   Temp: 98 F (36.7 C) 97.9 F (36.6 C) 98.3 F (36.8 C) 98 F (36.7 C)  TempSrc: Oral Oral Oral Oral  SpO2:      Weight:      Height:      O2 sat 97-98% on RA  CBC Latest Ref Rng & Units 03/05/2017 03/04/2017 09/22/2014  WBC 4.0 - 10.5 K/uL 12.2(H) 8.5 10.3  Hemoglobin 12.0 - 15.0 g/dL 11.1(L) 12.6 10.0(L)  Hematocrit 36.0 - 46.0 % 32.7(L) 37.5 30.6(L)  Platelets 150 - 400 K/uL 231 251 173     Physical Exam:  General: alert  Throat clear Chest clear. Heart RRR without murmur Lochia: appropriate Uterine Fundus: firm Abdomen:  + bowel sounds, area of apparent contact dermatitis on left side of abdomen, in area of OR drape placement. Incision: Honeycomb dressing CDI DVT Evaluation: No pedal or calf edema, negative Homan's, good pedal pulses  On Protonix already. Has received 3 doses prophylactic Lovenox post-op  Assessment/Plan: Status post cesarean delivery, day 3 Chest discomfort--no clinical evidence suggestive of PE Contact dermatitis from OR drape  Plan: Consulted with Dr. Su Hiltoberts Rx  Sodium Citrate, Flexeril now Chest xray Will evaluate patient after Cxr for d/c.   Holly Jackson, Holly Fleer MSN, CNM 03/07/2017, 8:56 AM

## 2017-03-07 NOTE — Discharge Instructions (Signed)
Incentive Spirometer An incentive spirometer is a tool that measures how well you are filling your lungs with each breath. This tool can help keep your lungs clear and active. Taking long, deep breaths may help reverse or decrease the chance of developing breathing (pulmonary) problems, especially infection, following:  Surgery of the chest or abdomen.  Surgery if you have a history of smoking or a lung problem.  A long period of time when you are unable to move or be active. If the spirometer includes an indicator to show your best effort, your health care provider or respiratory therapist will help you set a goal. Keep a log of your progress if directed by your health care provider. What are the risks?  Breathing too quickly may cause dizziness or cause you to pass out. Take your time so you do not get dizzy or lightheaded.  If you are in pain, you may need to take or ask for pain medicine before doing incentive spirometry. It is harder to take a deep breath if you are having pain. How to use your incentive spirometer 1. Sit on the edge of your bed if possible, or sit up as far as you can in bed or on a chair. 2. Hold the incentive spirometer in an upright position. 3. Breathe out normally. 4. Place the mouthpiece in your mouth and seal your lips tightly around it. 5. Breathe in slowly and as deeply as possible, raising the piston or the ball toward the top of the column. 6. Hold your breath for 3-5 seconds or for as long as possible. Allow the piston or ball to fall to the bottom of the column. 7. Remove the mouthpiece from your mouth and breathe out normally. 8. The spirometer may include an indicator to show your best effort. Use the indicator as a goal to work toward during each repetition. 9. Rest for a few seconds and repeat this at least 10 times, every 1-2 hours when you are awake. Take your time and take a few normal breaths between deep breaths. Breathing too quickly may cause  dizziness or cause you to pass out. Take your time so you do not get dizzy or lightheaded. 10. After each set of 10 deep breaths, practice coughing to be sure your lungs are clear. If you had a surgical cut (incision) made during surgery, support your incision when coughing by placing a pillow or rolled-up towel firmly against it. Once you are able to get out of bed, walk around indoors and cough well. You may stop using the incentive spirometer when instructed by your health care provider. Contact a health care provider if:  You are having difficulty using the spirometer.  You have trouble using the spirometer as often as instructed.  Your pain medicine is not giving enough relief while using the spirometer.  You have a fever.  You develop shortness of breath. Get help right away if:  You develop a cough with bloody sputum.  You develop worsening pain, redness, or discharge at or near the incision site. This information is not intended to replace advice given to you by your health care provider. Make sure you discuss any questions you have with your health care provider. Document Released: 04/22/2007 Document Revised: 09/03/2016 Document Reviewed: 07/19/2014 Elsevier Interactive Patient Education  2017 [employer] Home Care Instructions for Mom  ACTIVITY  Gradually return to your regular activities.  Let yourself rest. Nap while your baby sleeps.  Avoid lifting anything that is heavier  than 10 lb (4.5 kg) until your health care provider says it is okay.  Avoid activities that take a lot of effort and energy (are strenuous) until approved by your health care provider. Walking at a slow-to-moderate pace is usually safe.  If you had a cesarean delivery:  Do not vacuum, climb stairs, or drive a car for 4-6 weeks.  Have someone help you at home until you feel like you can do your usual activities yourself.  Do exercises as told by your health care provider, if this  applies. VAGINAL BLEEDING You may continue to bleed for 4-6 weeks after delivery. Over time, the amount of blood usually decreases and the color of the blood usually gets lighter. However, the flow of bright red blood may increase if you have been too active. If you need to use more than one pad in an hour because your pad gets soaked, or if you pass a large clot:  Lie down.  Raise your feet.  Place a cold compress on your lower abdomen.  Rest.  Call your health care provider. If you are breastfeeding, your period should return anytime between 8 weeks after delivery and the time that you stop breastfeeding. If you are not breastfeeding, your period should return 6-8 weeks after delivery. PERINEAL CARE The perineal area, or perineum, is the part of your body between your thighs. After delivery, this area needs special care. Follow these instructions as told by your health care provider.  Take warm tub baths for 15-20 minutes.  Use medicated pads and pain-relieving sprays and creams as told.  Do not use tampons or douches until vaginal bleeding has stopped.  Each time you go to the bathroom:  Use a peri bottle.  Change your pad.  Use towelettes in place of toilet paper until your stitches have healed.  Do Kegel exercises every day. Kegel exercises help to maintain the muscles that support the vagina, bladder, and bowels. You can do these exercises while you are standing, sitting, or lying down. To do Kegel exercises:  Tighten the muscles of your abdomen and the muscles that surround your birth canal.  Hold for a few seconds.  Relax.  Repeat until you have done this 5 times in a row.  To prevent hemorrhoids from developing or getting worse:  Drink enough fluid to keep your urine clear or pale yellow.  Avoid straining when having a bowel movement.  Take over-the-counter medicines and stool softeners as told by your health care provider. BREAST CARE  Wear a tight-fitting  bra.  Avoid taking over-the-counter pain medicine for breast discomfort.  Apply ice to the breasts to help with discomfort as needed:  Put ice in a plastic bag.  Place a towel between your skin and the bag.  Leave the ice on for 20 minutes or as told by your health care provider. NUTRITION  Eat a well-balanced diet.  Do not try to lose weight quickly by cutting back on calories.  Take your prenatal vitamins until your postpartum checkup or until your health care provider tells you to stop. POSTPARTUM DEPRESSION You may find yourself crying for no apparent reason and unable to cope with all of the changes that come with having a newborn. This mood is called postpartum depression. Postpartum depression happens because your hormone levels change after delivery. If you have postpartum depression, get support from your partner, friends, and family. If the depression does not go away on its own after several weeks, contact your health  care provider. BREAST SELF-EXAM Do a breast self-exam each month, at the same time of the month. If you are breastfeeding, check your breasts just after a feeding, when your breasts are less full. If you are breastfeeding and your period has started, check your breasts on day 5, 6, or 7 of your period. Report any lumps, bumps, or discharge to your health care provider. Know that breasts are normally lumpy if you are breastfeeding. This is temporary, and it is not a health risk. INTIMACY AND SEXUALITY Avoid sexual activity for at least 3-4 weeks after delivery or until the brownish-red vaginal flow is completely gone. If you want to avoid pregnancy, use some form of birth control. You can get pregnant after delivery, even if you have not had your period. SEEK MEDICAL CARE IF:  You feel unable to cope with the changes that a child brings to your life, and these feelings do not go away after several weeks.  You notice a lump, a bump, or discharge on your  breast. SEEK IMMEDIATE MEDICAL CARE IF:  Blood soaks your pad in 1 hour or less.  You have:  Severe pain or cramping in your lower abdomen.  A bad-smelling vaginal discharge.  A fever that is not controlled by medicine.  A fever, and an area of your breast is red and sore.  Pain or redness in your calf.  Sudden, severe chest pain.  Shortness of breath.  Painful or bloody urination.  Problems with your vision.  You vomit for 12 hours or longer.  You develop a severe headache.  You have serious thoughts about hurting yourself, your child, or anyone else. This information is not intended to replace advice given to you by your health care provider. Make sure you discuss any questions you have with your health care provider. Document Released: 12/07/2000 Document Revised: 05/17/2016 Document Reviewed: 06/13/2015 Elsevier Interactive Patient Education  2017 [employer].

## 2017-03-07 NOTE — Discharge Summary (Signed)
OB Discharge Summary     Patient Name: Holly Jackson DOB: 03/03/1981 MRN: [id number]  Date of admission: 03/04/2017 Delivering MD: Silverio LayIVARD, SANDRA   Date of delivery:  03/04/17  Date of discharge: 03/07/2017  Admitting diagnosis: Prior Cesarean  Intrauterine pregnancy: [id number]     Secondary diagnosis:  Principal Problem:   Status post repeat low transverse cesarean section Active Problems:   Previous cesarean delivery, antepartum condition or complication   History of depression   AMA (advanced maternal age) multigravida 35+, third trimester   Frank breech presentation   Velamentous insertion of umbilical cord   Placenta succenturiate lobe affecting fetus   Atelectasis of left lung  Additional problems:Chest discomfort pp, contact dermatitis     Discharge diagnosis: Term Pregnancy Delivered   Patient Active Problem List   Diagnosis Date Noted  . Atelectasis of left lung 03/07/2017  . Status post repeat low transverse cesarean section 03/04/2017  . Allergy to penicillin - causes SOB; mild resp distress 03/01/2017  . Allergy to sulfa drugs - mod-severe rash 03/01/2017  . History of depression 03/01/2017  . AMA (advanced maternal age) multigravida 35+, third trimester 03/01/2017  . Frank breech presentation 03/01/2017  . Velamentous insertion of umbilical cord 03/01/2017  . Placenta succenturiate lobe affecting fetus 03/01/2017  . Previous cesarean delivery, antepartum condition or complication 09/22/2014  . Obesity, unspecified 09/17/2014  . Generalized anxiety disorder 09/17/2014  . Hx of abdominoplasty 09/17/2014                                                                                                Post partum procedures:NA  Complications: None  Hospital course:  Sceduled C/S   36 y.o. yo G2P2002 at [id number] was admitted to the hospital 03/04/2017 for scheduled cesarean section with the following indication:Elective Repeat.  Membrane Rupture Time/Date: 11:30 AM  ,03/04/2017   Patient delivered a Viable infant.03/04/2017  Details of operation can be found in separate operative note.  Pateint had an uncomplicated postpartum course.  She is ambulating, tolerating a regular diet, passing flatus, and urinating well. Patient is discharged home in stable condition on  03/07/17 She had some mid-sternal chest discomfort, given med for reflux and Flexeril.  Cxr showed linear atelectasis of the left lung.  Per consult with Dr. Su Hiltoberts, patient was instructed to use her IC at least TID, and patient was OK'd for d/c home.  She also had a small area of contact dermatitis at the site of the OR drape, on the left side of her abdomen.  Hydrocortisone cream obtained for patient to use TID prn.  She also requested an Rx for Ambien, given 10, no refills.  Physical exam  Vitals:   03/05/17 1806 03/06/17 0614 03/06/17 1818 03/07/17 0651  BP: (!) 114/59 (!) 100/55 115/71 (!) 107/58  Pulse: 89 84 91 88  Resp: 18 18 18 18   Temp: 98 F (36.7 C) 97.9 F (36.6 C) 98.3 F (36.8 C) 98 F (36.7 C)  TempSrc: Oral Oral Oral Oral  SpO2:      Weight:      Height:  General: alert Lochia: appropriate Uterine Fundus: firm Incision: Healing well with no significant drainage DVT Evaluation: No evidence of DVT seen on physical exam. Negative Homan's sign. No significant calf/ankle edema. Labs: Lab Results  Component Value Date   WBC 12.2 (H) 03/05/2017   HGB 11.1 (L) 03/05/2017   HCT 32.7 (L) 03/05/2017   MCV 77.5 (L) 03/05/2017   PLT 231 03/05/2017   CMP Latest Ref Rng & Units 03/04/2017  Glucose 70 - 99 mg/dL -  BUN 6 - 23 mg/dL -  Creatinine 4.09 - 8.11 mg/dL 9.14  Sodium 782 - 956 mEq/L -  Potassium 3.7 - 5.3 mEq/L -  Chloride 96 - 112 mEq/L -  CO2 19 - 32 mEq/L -  Calcium 8.4 - 10.5 mg/dL -  Total Protein 6.0 - 8.3 g/dL -  Total Bilirubin 0.3 - 1.2 mg/dL -  Alkaline Phos 39 - 213 U/L -  AST 0 - 37 U/L -  ALT 0 - 35 U/L -    Discharge instruction: per  After Visit Summary and "Baby and Me Booklet".  After visit meds:  Allergies as of 03/07/2017      Reactions   Penicillins Shortness Of Breath   Has patient had a PCN reaction causing immediate rash, facial/tongue/throat swelling, SOB or lightheadedness with hypotension: Yes Has patient had a PCN reaction causing severe rash involving mucus membranes or skin necrosis: No Has patient had a PCN reaction that required hospitalization No Has patient had a PCN reaction occurring within the last 10 years: No If all of the above answers are "NO", then may proceed with Cephalosporin use.   Sulfa Antibiotics Rash      Medication List    TAKE these medications   acetaminophen 500 MG tablet Commonly known as:  TYLENOL Take 500 mg by mouth every 6 (six) hours as needed for mild pain or moderate pain.   cyclobenzaprine 10 MG tablet Commonly known as:  FLEXERIL Take 1 tablet (10 mg total) by mouth 3 (three) times daily as needed for muscle spasms.   hydrOXYzine 25 MG tablet Commonly known as:  ATARAX/VISTARIL Take 25 mg by mouth daily as needed for itching.   ibuprofen 600 MG tablet Commonly known as:  ADVIL,MOTRIN Take 1 tablet (600 mg total) by mouth every 6 (six) hours. What changed:  when to take this  reasons to take this   oxyCODONE-acetaminophen 5-325 MG tablet Commonly known as:  PERCOCET/ROXICET Take 2 tablets by mouth every 6 (six) hours as needed (pain scale > 7). What changed:  how much to take  when to take this  reasons to take this   pantoprazole 20 MG tablet Commonly known as:  PROTONIX Take 20 mg by mouth daily.   prenatal multivitamin Tabs tablet Take 1 tablet by mouth daily at 12 noon.   zolpidem 5 MG tablet Commonly known as:  AMBIEN Take 1 tablet (5 mg total) by mouth at bedtime as needed for sleep.       Diet: routine diet  Activity: Advance as tolerated. Pelvic rest for 6 weeks.   Outpatient follow up:6 weeks Follow up Appt:No future  appointments. Follow up Visit:No Follow-up on file.  Postpartum contraception: Undecided  Newborn Data: Live born female  Birth Weight: 7 lb 5.1 oz (3320 g) APGAR: 8, 8  Baby Feeding: Bottle and Breast Disposition:home with mother   03/07/2017 Nigel Bridgeman, CNM

## 2017-03-07 NOTE — Lactation Note (Signed)
This note was copied from a baby's chart. Lactation Consultation Note  Patient Name: Holly Jackson Reason for consult: Follow-up assessment  Baby 71 hours old. Mom reports that baby is latching much better now, and her breasts are starting to fill. Mom states that the baby was given formula earlier in the CN when mom went for a scan. Enc mom to call for LC to assist with latch and breast examination for fullness at next feeding. MOB has this LC's extension and states she will call.   Maternal Data    Feeding Feeding Type: Bottle Fed - Formula Nipple Type: Slow - flow  LATCH Score/Interventions                      Lactation Tools Discussed/Used     Consult Status Consult Status: Follow-up Date: 03/07/17 Follow-up type: In-patient    Holly Jackson Jackson, 10:51 AM

## 2017-04-19 DIAGNOSIS — B085 Enteroviral vesicular pharyngitis: Secondary | ICD-10-CM | POA: Diagnosis not present

## 2017-04-19 DIAGNOSIS — J029 Acute pharyngitis, unspecified: Secondary | ICD-10-CM | POA: Diagnosis not present

## 2017-04-24 DIAGNOSIS — R5383 Other fatigue: Secondary | ICD-10-CM | POA: Diagnosis not present

## 2017-04-30 DIAGNOSIS — F341 Dysthymic disorder: Secondary | ICD-10-CM | POA: Diagnosis not present

## 2017-05-15 DIAGNOSIS — J029 Acute pharyngitis, unspecified: Secondary | ICD-10-CM | POA: Diagnosis not present

## 2017-05-15 DIAGNOSIS — J069 Acute upper respiratory infection, unspecified: Secondary | ICD-10-CM | POA: Diagnosis not present

## 2017-05-16 ENCOUNTER — Ambulatory Visit: Payer: Self-pay | Admitting: Allergy and Immunology

## 2017-05-23 ENCOUNTER — Encounter: Payer: Self-pay | Admitting: Allergy and Immunology

## 2017-05-23 ENCOUNTER — Ambulatory Visit (INDEPENDENT_AMBULATORY_CARE_PROVIDER_SITE_OTHER): Payer: BLUE CROSS/BLUE SHIELD | Admitting: Allergy and Immunology

## 2017-05-23 VITALS — BP 126/74 | HR 89 | Temp 98.0°F | Resp 16 | Ht 63.0 in | Wt 175.2 lb

## 2017-05-23 DIAGNOSIS — B999 Unspecified infectious disease: Secondary | ICD-10-CM

## 2017-05-23 DIAGNOSIS — J3089 Other allergic rhinitis: Secondary | ICD-10-CM

## 2017-05-23 DIAGNOSIS — L309 Dermatitis, unspecified: Secondary | ICD-10-CM | POA: Diagnosis not present

## 2017-05-23 DIAGNOSIS — J4530 Mild persistent asthma, uncomplicated: Secondary | ICD-10-CM | POA: Diagnosis not present

## 2017-05-23 MED ORDER — FLUTICASONE PROPIONATE 93 MCG/ACT NA EXHU: 2.0000 | INHALANT_SUSPENSION | Freq: Two times a day (BID) | NASAL | 5 refills | Status: DC

## 2017-05-23 MED ORDER — ALBUTEROL SULFATE HFA 108 (90 BASE) MCG/ACT IN AERS: 2.0000 | INHALATION_SPRAY | Freq: Four times a day (QID) | RESPIRATORY_TRACT | 1 refills | Status: DC | PRN

## 2017-05-23 MED ORDER — MONTELUKAST SODIUM 10 MG PO TABS: 10.0000 mg | ORAL_TABLET | Freq: Every day | ORAL | 5 refills | Status: DC

## 2017-05-23 NOTE — Progress Notes (Signed)
New Patient Note  RE: Holly Jackson MRN: [id number] DOB: 1981/05/31 Date of Office Visit: 05/23/2017  Referring provider: No ref. provider found Primary care provider: Patient, No Pcp Per  Chief Complaint: Nasal Congestion; Cough; and Rash   History of present illness: Holly Jackson is a 36 y.o. female presenting today for evaluation of rhinitis, cough, and rash. Over the past 10 years, she has experienced progressive nasal/sinus symptoms; including "blowing my nose constantly", nasal congestion, post nasal drainage, and sinus pressure.  No significant seasonal symptom variation has been noted nor have specific environmental triggers been identified.  She reports that she cannot leave house without a handkerchief or tissue because of nasal drainage.  She is concerned about recurrent sinus infections and generally feeling sick.  She also complains of a productive cough which feels like it originates from the chest.  She does not experience overt dyspnea, chest tightness, or wheezing. Holly Jackson complains of a rash which appears on sun exposed skin, primarily her chest and forearms.  The rash typically take several days to resolve.  The rash is pruritic and described as red with raised areas.  She has noticed small blisters that form on her fingers when she has been in the sun.  She does not experience concomitant cardiopulmonary or GI symptoms.   Assessment and plan: Dermatitis Dermatitis in sun exposed areas.  Solar urticaria versus porphyria cutanea tarda versus lupus or other autoimmune disorder.  Continue avoidance of sun exposure.    Instructions have been discussed and provided for H1/H2 receptor blockade with titration to find lowest effective dose.  A laboratory order has been provided for plasma porphyrins, CBC, CMP, ANA, and ESR.  When lab results have returned the patient will be called with further recommendations and follow up instructions.  If labs are unrevealing, we  will refer to dermatology for further consultation.  If laboratory results reveal an autoimmune process, she will be referred to a rheumatologist.  Perennial and seasonal allergic rhinitis  A prescription has been provided for [facility], 2 actuations per nostril twice a day. Proper technique has been discussed and demonstrated.  Nasal saline lavage (NeilMed) has been recommended as needed and prior to medicated nasal sprays along with instructions for proper administration.  For thick post nasal drainage, add guaifenesin 1200 mg (Mucinex Maximum Strength)  twice daily as needed with adequate hydration as discussed.  If allergen avoidance measures and medications fail to adequately relieve symptoms, aeroallergen immunotherapy will be considered.  Recurrent infections Immunocompetence will be assessed with labs.  The following labs have been ordered: CBC with differential, IgG, IgA and IgM, tetanus IgG titers, and pneumococcal IgG titers.  If pre-vaccination titers are low, post-vaccination titers will be drawn to assess response.    The patient will be called with further recommendations and follow-up instructions once the labs have returned.  Mild persistent asthma Asthma/cough variant asthma. Today's spirometry results, assessed while asymptomatic, suggest under-perception of bronchoconstriction.  A prescription has been provided for montelukast 10 mg daily at bedtime.  A prescription has been provided for albuterol HFA, 1-2 inhalations every 4-6 hours as needed.   Meds ordered this encounter  Medications  . Fluticasone Propionate (XHANCE) 93 MCG/ACT EXHU    Sig: Place 2 sprays into the nose 2 (two) times daily.    Dispense:  16 mL    Refill:  5  . montelukast (SINGULAIR) 10 MG tablet    Sig: Take 1 tablet (10 mg total) by mouth at bedtime.  Dispense:  30 tablet    Refill:  5  . albuterol (PROVENTIL HFA;VENTOLIN HFA) 108 (90 Base) MCG/ACT inhaler    Sig: Inhale 2 puffs into the  lungs every 6 (six) hours as needed for wheezing or shortness of breath.    Dispense:  1 Inhaler    Refill:  1    Diagnostics: Spirometry: FVC was 2.25 L and FEV1 was 1.83 L (72% predicted) with significant (370 mL, 20%) postbronchodilator improvement. This study was performed while the patient was asymptomatic.  Please see scanned spirometry results for details. Epicutaneous testing: Positive to tree pollen. Intradermal testing: Positive to grass pollen, ragweed pollen, weed pollen, molds, dog epithelia, and dust mite antigen.    Physical examination: Blood pressure 126/74, pulse 89, temperature 98 F (36.7 C), temperature source Oral, resp. rate 16, height _0  (1.6 m), weight 175 lb 3.2 oz (79.5 kg), SpO2 96 %, currently breastfeeding.  General: Alert, interactive, in no acute distress. HEENT: TMs pearly gray, turbinates edematous with thick discharge, post-pharynx moderately erythematous. Neck: Supple without lymphadenopathy. Lungs: Clear to auscultation without wheezing, rhonchi or rales. CV: Normal S1, S2 without murmurs. Abdomen: Nondistended, nontender. Skin: Warm and dry, without lesions or rashes. Extremities:  No clubbing, cyanosis or edema. Neuro:   Grossly intact.  Review of systems:  Review of systems negative except as noted in HPI / PMHx or noted below: Review of Systems  Constitutional: Negative.   HENT: Negative.   Eyes: Negative.   Respiratory: Negative.   Cardiovascular: Negative.   Gastrointestinal: Negative.   Genitourinary: Negative.   Musculoskeletal: Negative.   Skin: Negative.   Neurological: Negative.   Endo/Heme/Allergies: Negative.   Psychiatric/Behavioral: Negative.     Past medical history:  Past Medical History:  Diagnosis Date  . Anxiety   . Cholestasis of pregnancy   . Vaginal Pap smear, abnormal    pos HPV    Past surgical history:  Past Surgical History:  Procedure Laterality Date  . ABDOMINOPLASTY    . CESAREAN SECTION N/A  09/21/2014   Procedure: CESAREAN SECTION;  Surgeon: Alinda Dooms, MD;  Location: [facility];  Service: Obstetrics;  Laterality: N/A;  . CESAREAN SECTION N/A 03/04/2017   Procedure: CESAREAN SECTION;  Surgeon: Delsa Bern, MD;  Location: [facility];  Service: Obstetrics;  Laterality: N/A;    Family history: Family History  Problem Relation Age of Onset  . Miscarriages / Stillbirths Sister   . Allergic rhinitis Neg Hx   . Angioedema Neg Hx   . Asthma Neg Hx   . Eczema Neg Hx   . Immunodeficiency Neg Hx   . Urticaria Neg Hx     Social history: Social History   Social History  . Marital status: Married    Spouse name: N/A  . Number of children: N/A  . Years of education: N/A   Occupational History  . Not on file.   Social History Main Topics  . Smoking status: Former Research scientist (life sciences)  . Smokeless tobacco: Never Used  . Alcohol use No  . Drug use: No  . Sexual activity: Yes   Other Topics Concern  . Not on file   Social History Narrative  . No narrative on file   Environmental History: The patient lives in an 36 year old house with carpeting throughout, gas heat, and central air.  She is a nonsmoker without pets.  There is no known mold/water damage in the home.  Allergies as of 05/23/2017      Reactions  Penicillins Shortness Of Breath   Has patient had a PCN reaction causing immediate rash, facial/tongue/throat swelling, SOB or lightheadedness with hypotension: Yes Has patient had a PCN reaction causing severe rash involving mucus membranes or skin necrosis: No Has patient had a PCN reaction that required hospitalization No Has patient had a PCN reaction occurring within the last 10 years: No If all of the above answers are "NO", then may proceed with Cephalosporin use.   Sulfa Antibiotics Rash      Medication List       Accurate as of 05/23/17 11:59 PM. Always use your most recent med list.          acetaminophen 500 MG tablet Commonly known as:   TYLENOL Take 500 mg by mouth every 6 (six) hours as needed for mild pain or moderate pain.   albuterol 108 (90 Base) MCG/ACT inhaler Commonly known as:  PROVENTIL HFA;VENTOLIN HFA Inhale 2 puffs into the lungs every 6 (six) hours as needed for wheezing or shortness of breath.   cyclobenzaprine 10 MG tablet Commonly known as:  FLEXERIL Take 1 tablet (10 mg total) by mouth 3 (three) times daily as needed for muscle spasms.   Fluticasone Propionate 93 MCG/ACT Exhu Commonly known as:  XHANCE Place 2 sprays into the nose 2 (two) times daily.   hydrOXYzine 25 MG tablet Commonly known as:  ATARAX/VISTARIL Take 25 mg by mouth daily as needed for itching.   ibuprofen 600 MG tablet Commonly known as:  ADVIL,MOTRIN Take 1 tablet (600 mg total) by mouth every 6 (six) hours.   montelukast 10 MG tablet Commonly known as:  SINGULAIR Take 1 tablet (10 mg total) by mouth at bedtime.   oxyCODONE-acetaminophen 5-325 MG tablet Commonly known as:  PERCOCET/ROXICET Take 2 tablets by mouth every 6 (six) hours as needed (pain scale > 7).   pantoprazole 20 MG tablet Commonly known as:  PROTONIX Take 20 mg by mouth daily.   prenatal multivitamin Tabs tablet Take 1 tablet by mouth daily at 12 noon.   zolpidem 5 MG tablet Commonly known as:  AMBIEN Take 1 tablet (5 mg total) by mouth at bedtime as needed for sleep.       Known medication allergies: Allergies  Allergen Reactions  . Penicillins Shortness Of Breath    Has patient had a PCN reaction causing immediate rash, facial/tongue/throat swelling, SOB or lightheadedness with hypotension: Yes Has patient had a PCN reaction causing severe rash involving mucus membranes or skin necrosis: No Has patient had a PCN reaction that required hospitalization No Has patient had a PCN reaction occurring within the last 10 years: No If all of the above answers are "NO", then may proceed with Cephalosporin use.   . Sulfa Antibiotics Rash    I  appreciate the opportunity to take part in Masyn's care. Please do not hesitate to contact me with questions.  Sincerely,   R. Edgar Frisk, MD

## 2017-05-23 NOTE — Assessment & Plan Note (Signed)
   Immunocompetence will be assessed with labs.  The following labs have been ordered: CBC with differential, IgG, IgA and IgM, tetanus IgG titers, and pneumococcal IgG titers.  If pre-vaccination titers are low, post-vaccination titers will be drawn to assess response.    The patient will be called with further recommendations and follow-up instructions once the labs have returned. 

## 2017-05-23 NOTE — Patient Instructions (Addendum)
Dermatitis Dermatitis in sun exposed areas.  Solar urticaria versus porphyria cutanea tarda versus lupus or other autoimmune disorder.  Continue avoidance of sun exposure.    Instructions have been discussed and provided for H1/H2 receptor blockade with titration to find lowest effective dose.  A laboratory order has been provided for plasma porphyrins, CBC, CMP, ANA, and ESR.  When lab results have returned the patient will be called with further recommendations and follow up instructions.  Perennial and seasonal allergic rhinitis  A prescription has been provided for [facility], 2 actuations per nostril twice a day. Proper technique has been discussed and demonstrated.  Nasal saline lavage (NeilMed) has been recommended as needed and prior to medicated nasal sprays along with instructions for proper administration.  For thick post nasal drainage, add guaifenesin 1200 mg (Mucinex Maximum Strength)  twice daily as needed with adequate hydration as discussed.  If allergen avoidance measures and medications fail to adequately relieve symptoms, aeroallergen immunotherapy will be considered.  Recurrent infections Immunocompetence will be assessed with labs.  The following labs have been ordered: CBC with differential, IgG, IgA and IgM, tetanus IgG titers, and pneumococcal IgG titers.  If pre-vaccination titers are low, post-vaccination titers will be drawn to assess response.    The patient will be called with further recommendations and follow-up instructions once the labs have returned.  Mild persistent asthma Asthma/cough variant asthma. Today's spirometry results, assessed while asymptomatic, suggest under-perception of bronchoconstriction.  A prescription has been provided for montelukast 10 mg daily at bedtime.  A prescription has been provided for albuterol HFA, 1-2 inhalations every 4-6 hours as needed.   When lab results have returned the patient will be called with further  recommendations and follow up instructions.  Reducing Pollen Exposure  The American Academy of Allergy, Asthma and Immunology suggests the following steps to reduce your exposure to pollen during allergy seasons.    1. Do not hang sheets or clothing out to dry; pollen may collect on these items. 2. Do not mow lawns or spend time around freshly cut grass; mowing stirs up pollen. 3. Keep windows closed at night.  Keep car windows closed while driving. 4. Minimize morning activities outdoors, a time when pollen counts are usually at their highest. 5. Stay indoors as much as possible when pollen counts or humidity is high and on windy days when pollen tends to remain in the air longer. 6. Use air conditioning when possible.  Many air conditioners have filters that trap the pollen spores. 7. Use a HEPA room air filter to remove pollen form the indoor air you breathe.   Control of House Dust Mite Allergen  House dust mites play a major role in allergic asthma and rhinitis.  They occur in environments with high humidity wherever human skin, the food for dust mites is found. High levels have been detected in dust obtained from mattresses, pillows, carpets, upholstered furniture, bed covers, clothes and soft toys.  The principal allergen of the house dust mite is found in its feces.  A gram of dust may contain 1,000 mites and 250,000 fecal particles.  Mite antigen is easily measured in the air during house cleaning activities.    1. Encase mattresses, including the box spring, and pillow, in an air tight cover.  Seal the zipper end of the encased mattresses with wide adhesive tape. 2. Wash the bedding in water of 130 degrees Farenheit weekly.  Avoid cotton comforters/quilts and flannel bedding: the most ideal bed covering is the  dacron comforter. 3. Remove all upholstered furniture from the bedroom. 4. Remove carpets, carpet padding, rugs, and non-washable window drapes from the bedroom.  Wash drapes  weekly or use plastic window coverings. 5. Remove all non-washable stuffed toys from the bedroom.  Wash stuffed toys weekly. 6. Have the room cleaned frequently with a vacuum cleaner and a damp dust-mop.  The patient should not be in a room which is being cleaned and should wait 1 hour after cleaning before going into the room. 7. Close and seal all heating outlets in the bedroom.  Otherwise, the room will become filled with dust-laden air.  An electric heater can be used to heat the room. Reduce indoor humidity to less than 50%.  Do not use a humidifier.  Control of Dog or Cat Allergen  Avoidance is the best way to manage a dog or cat allergy. If you have a dog or cat and are allergic to dog or cats, consider removing the dog or cat from the home. If you have a dog or cat but don't want to find it a new home, or if your family wants a pet even though someone in the household is allergic, here are some strategies that may help keep symptoms at bay:  1. Keep the pet out of your bedroom and restrict it to only a few rooms. Be advised that keeping the dog or cat in only one room will not limit the allergens to that room. 2. Don't pet, hug or kiss the dog or cat; if you do, wash your hands with soap and water. 3. High-efficiency particulate air (HEPA) cleaners run continuously in a bedroom or living room can reduce allergen levels over time. 4. Regular use of a high-efficiency vacuum cleaner or a central vacuum can reduce allergen levels. 5. Giving your dog or cat a bath at least once a week can reduce airborne allergen.  Control of Mold Allergen  Mold and fungi can grow on a variety of surfaces provided certain temperature and moisture conditions exist.  Outdoor molds grow on plants, decaying vegetation and soil.  The major outdoor mold, Alternaria and Cladosporium, are found in very high numbers during hot and dry conditions.  Generally, a late Summer - Fall peak is seen for common outdoor fungal  spores.  Rain will temporarily lower outdoor mold spore count, but counts rise rapidly when the rainy period ends.  The most important indoor molds are Aspergillus and Penicillium.  Dark, humid and poorly ventilated basements are ideal sites for mold growth.  The next most common sites of mold growth are the bathroom and the kitchen.  Outdoor [employer] 2. Use air conditioning and keep windows closed 3. Avoid exposure to decaying vegetation. 4. Avoid leaf raking. 5. Avoid grain handling. 6. Consider wearing a face mask if working in moldy areas.  Indoor Mold Control 1. Maintain humidity below 50%. 2. Clean washable surfaces with 5% bleach solution. 3. Remove sources e.g. Contaminated carpets.   Urticaria (Hives)  . Levocetirizine (Xyzal) 5 mg twice a day and ranitidine (Zantac) 150 mg twice a day. If no symptoms for 7-14 days then decrease to. . Levocetirizine (Xyzal) 5 mg twice a day and ranitidine (Zantac) 150 mg once a day.  If no symptoms for 7-14 days then decrease to. . Levocetirizine (Xyzal) 5 mg twice a day.  If no symptoms for 7-14 days then decrease to. . Levocetirizine (Xyzal) 5 mg once a day.  May use Benadryl (diphenhydramine) as needed for breakthrough  symptoms       If symptoms return, then step up dosage

## 2017-05-23 NOTE — Assessment & Plan Note (Signed)
Asthma/cough variant asthma. Today's spirometry results, assessed while asymptomatic, suggest under-perception of bronchoconstriction.  A prescription has been provided for montelukast 10 mg daily at bedtime.  A prescription has been provided for albuterol HFA, 1-2 inhalations every 4-6 hours as needed.

## 2017-05-23 NOTE — Assessment & Plan Note (Addendum)
   [facility] 2 actuations per nostril twice a day. Proper technique has been discussed and demonstrated.  Nasal saline lavage (NeilMed) has been recommended as needed and prior to medicated nasal sprays along with instructions for proper administration.  For thick post nasal drainage, add guaifenesin 1200 mg (Mucinex Maximum Strength)  twice daily as needed with adequate hydration as discussed.  If allergen avoidance measures and medications fail to adequately relieve symptoms, aeroallergen immunotherapy will be considered.

## 2017-05-23 NOTE — Assessment & Plan Note (Addendum)
Dermatitis in sun exposed areas.  Solar urticaria versus porphyria cutanea tarda versus lupus or other autoimmune disorder.  Continue avoidance of sun exposure.    Instructions have been discussed and provided for H1/H2 receptor blockade with titration to find lowest effective dose.  A laboratory order has been provided for plasma porphyrins, CBC, CMP, ANA, and ESR.  When lab results have returned the patient will be called with further recommendations and follow up instructions.  If labs are unrevealing, we will refer to dermatology for further consultation.  If laboratory results reveal an autoimmune process, she will be referred to a rheumatologist. 

## 2017-05-24 DIAGNOSIS — B999 Unspecified infectious disease: Secondary | ICD-10-CM | POA: Diagnosis not present

## 2017-05-24 DIAGNOSIS — L309 Dermatitis, unspecified: Secondary | ICD-10-CM | POA: Diagnosis not present

## 2017-05-29 DIAGNOSIS — Z30430 Encounter for insertion of intrauterine contraceptive device: Secondary | ICD-10-CM | POA: Diagnosis not present

## 2017-05-30 LAB — CBC WITH DIFFERENTIAL/PLATELET
Basophils Absolute: 0 10*3/uL (ref 0.0–0.2)
Basos: 0 %
EOS (ABSOLUTE): 0.1 10*3/uL (ref 0.0–0.4)
Eos: 2 %
Hematocrit: 39.4 % (ref 34.0–46.6)
Hemoglobin: 12.9 g/dL (ref 11.1–15.9)
Immature Grans (Abs): 0 10*3/uL (ref 0.0–0.1)
Immature Granulocytes: 0 %
Lymphocytes Absolute: 1.9 10*3/uL (ref 0.7–3.1)
Lymphs: 27 %
MCH: 25.8 pg — ABNORMAL LOW (ref 26.6–33.0)
MCHC: 32.7 g/dL (ref 31.5–35.7)
MCV: 79 fL (ref 79–97)
Monocytes Absolute: 0.6 10*3/uL (ref 0.1–0.9)
Monocytes: 8 %
Neutrophils Absolute: 4.5 10*3/uL (ref 1.4–7.0)
Neutrophils: 63 %
Platelets: 347 10*3/uL (ref 150–379)
RBC: 5 x10E6/uL (ref 3.77–5.28)
RDW: 15.3 % (ref 12.3–15.4)
WBC: 7.1 10*3/uL (ref 3.4–10.8)

## 2017-05-30 LAB — PNEUMOCOCCAL AB (23 SEROTYPE)
Pneumo Ab Type 1*: 1 ug/mL — ABNORMAL LOW (ref >1.3)
Pneumo Ab Type 12 (12F)*: <0.1 ug/mL — ABNORMAL LOW (ref >1.3)
Pneumo Ab Type 14*: <0.1 ug/mL — ABNORMAL LOW (ref >1.3)
Pneumo Ab Type 17 (17F)*: <0.1 ug/mL — ABNORMAL LOW (ref >1.3)
Pneumo Ab Type 19 (19F)*: <0.1 ug/mL — ABNORMAL LOW (ref >1.3)
Pneumo Ab Type 2*: <0.1 ug/mL — ABNORMAL LOW (ref >1.3)
Pneumo Ab Type 20*: 0.3 ug/mL — ABNORMAL LOW (ref >1.3)
Pneumo Ab Type 22 (22F)*: <0.1 ug/mL — ABNORMAL LOW (ref >1.3)
Pneumo Ab Type 23 (23F)*: 0.2 ug/mL — ABNORMAL LOW (ref >1.3)
Pneumo Ab Type 26 (6B)*: <0.1 ug/mL — ABNORMAL LOW (ref >1.3)
Pneumo Ab Type 3*: 0.3 ug/mL — ABNORMAL LOW (ref >1.3)
Pneumo Ab Type 34 (10A)*: <0.1 ug/mL — ABNORMAL LOW (ref >1.3)
Pneumo Ab Type 4*: 0.3 ug/mL — ABNORMAL LOW (ref >1.3)
Pneumo Ab Type 43 (11A)*: 0.1 ug/mL — ABNORMAL LOW (ref >1.3)
Pneumo Ab Type 5*: <0.2 ug/mL — ABNORMAL LOW (ref >1.3)
Pneumo Ab Type 51 (7F)*: 0.2 ug/mL — ABNORMAL LOW (ref >1.3)
Pneumo Ab Type 54 (15B)*: 0.2 ug/mL — ABNORMAL LOW (ref >1.3)
Pneumo Ab Type 56 (18C)*: <0.1 ug/mL — ABNORMAL LOW (ref >1.3)
Pneumo Ab Type 57 (19A)*: 0.3 ug/mL — ABNORMAL LOW (ref >1.3)
Pneumo Ab Type 68 (9V)*: <0.1 ug/mL — ABNORMAL LOW (ref >1.3)
Pneumo Ab Type 70 (33F)*: 1.1 ug/mL — ABNORMAL LOW (ref >1.3)
Pneumo Ab Type 8*: 0.1 ug/mL — ABNORMAL LOW (ref >1.3)
Pneumo Ab Type 9 (9N)*: <0.1 ug/mL — ABNORMAL LOW (ref >1.3)

## 2017-05-30 LAB — COMPREHENSIVE METABOLIC PANEL
ALT: 14 IU/L (ref 0–32)
AST: 21 IU/L (ref 0–40)
Albumin/Globulin Ratio: 1.6 (ref 1.2–2.2)
Albumin: 4.9 g/dL (ref 3.5–5.5)
Alkaline Phosphatase: 97 IU/L (ref 39–117)
BUN/Creatinine Ratio: 23 (ref 9–23)
BUN: 15 mg/dL (ref 6–20)
Bilirubin Total: 0.5 mg/dL (ref 0.0–1.2)
CO2: 23 mmol/L (ref 18–29)
Calcium: 9.5 mg/dL (ref 8.7–10.2)
Chloride: 99 mmol/L (ref 96–106)
Creatinine, Ser: 0.66 mg/dL (ref 0.57–1.00)
GFR calc Af Amer: 132 mL/min/{1.73_m2} (ref >59)
GFR calc non Af Amer: 115 mL/min/{1.73_m2} (ref >59)
Globulin, Total: 3 g/dL (ref 1.5–4.5)
Glucose: 84 mg/dL (ref 65–99)
Potassium: 4.2 mmol/L (ref 3.5–5.2)
Sodium: 139 mmol/L (ref 134–144)
Total Protein: 7.9 g/dL (ref 6.0–8.5)

## 2017-05-30 LAB — IGG, IGA, IGM
IgA/Immunoglobulin A, Serum: 231 mg/dL (ref 87–352)
IgG (Immunoglobin G), Serum: 1260 mg/dL (ref 700–1600)
IgM (Immunoglobulin M), Srm: 95 mg/dL (ref 26–217)

## 2017-05-30 LAB — ANA: Anti Nuclear Antibody(ANA): POSITIVE — AB

## 2017-05-30 LAB — TETANUS ANTIBODY, IGG: Tetanus Ab, IgG: >7 IU/mL (ref <0.10)

## 2017-05-30 LAB — PORPHYRINS, TOTAL PLASMA: Porphyrins: <0.1 ug/dL (ref 0.0–1.0)

## 2017-05-30 LAB — SEDIMENTATION RATE: Sed Rate: 12 mm/hr (ref 0–32)

## 2017-06-11 ENCOUNTER — Telehealth: Payer: Self-pay

## 2017-06-11 DIAGNOSIS — F329 Major depressive disorder, single episode, unspecified: Secondary | ICD-10-CM | POA: Diagnosis not present

## 2017-06-11 DIAGNOSIS — Z30431 Encounter for routine checking of intrauterine contraceptive device: Secondary | ICD-10-CM | POA: Diagnosis not present

## 2017-06-11 DIAGNOSIS — Z3040 Encounter for surveillance of contraceptives, unspecified: Secondary | ICD-10-CM | POA: Diagnosis not present

## 2017-06-11 DIAGNOSIS — Z6832 Body mass index (BMI) 32.0-32.9, adult: Secondary | ICD-10-CM | POA: Diagnosis not present

## 2017-06-11 DIAGNOSIS — Z01419 Encounter for gynecological examination (general) (routine) without abnormal findings: Secondary | ICD-10-CM | POA: Diagnosis not present

## 2017-06-12 NOTE — Telephone Encounter (Signed)
Lm for pt to call us back, need to know if she can come in Friday to the [place] office for her pneumovax.

## 2017-06-13 ENCOUNTER — Telehealth: Payer: Self-pay

## 2017-06-13 DIAGNOSIS — B999 Unspecified infectious disease: Secondary | ICD-10-CM

## 2017-06-13 NOTE — Telephone Encounter (Signed)
Patient called.  Wants pneumovax order (per Dr. Nunzio CobbsBobbitt) faxed to [facility] [place], [place].  Faxed to:   [id number] [place], [place] - [address] [phone] (Phone) [phone] (Fax)   Called and spoke with pharmacist.  He received fax and will have Pneumovax ready for patient today at 5:30 pm (per patient request).  Mailed out post vaccine lab titers to patients home address.  Patient given all information.

## 2017-07-11 DIAGNOSIS — N3090 Cystitis, unspecified without hematuria: Secondary | ICD-10-CM | POA: Diagnosis not present

## 2017-07-11 DIAGNOSIS — J4520 Mild intermittent asthma, uncomplicated: Secondary | ICD-10-CM | POA: Diagnosis not present

## 2017-07-11 DIAGNOSIS — R300 Dysuria: Secondary | ICD-10-CM | POA: Diagnosis not present

## 2017-07-17 DIAGNOSIS — R7301 Impaired fasting glucose: Secondary | ICD-10-CM | POA: Diagnosis not present

## 2017-07-17 DIAGNOSIS — J069 Acute upper respiratory infection, unspecified: Secondary | ICD-10-CM | POA: Diagnosis not present

## 2017-07-17 DIAGNOSIS — Z9189 Other specified personal risk factors, not elsewhere classified: Secondary | ICD-10-CM | POA: Diagnosis not present

## 2017-07-26 IMAGING — CR DG CHEST 2V
2 series · 2 of 2 positions shown · non-contrast
Comparison: 01/09/2014

CLINICAL DATA: Three days postpartum.  Chest pain.

EXAM:
CHEST  2 VIEW

[chest pa]
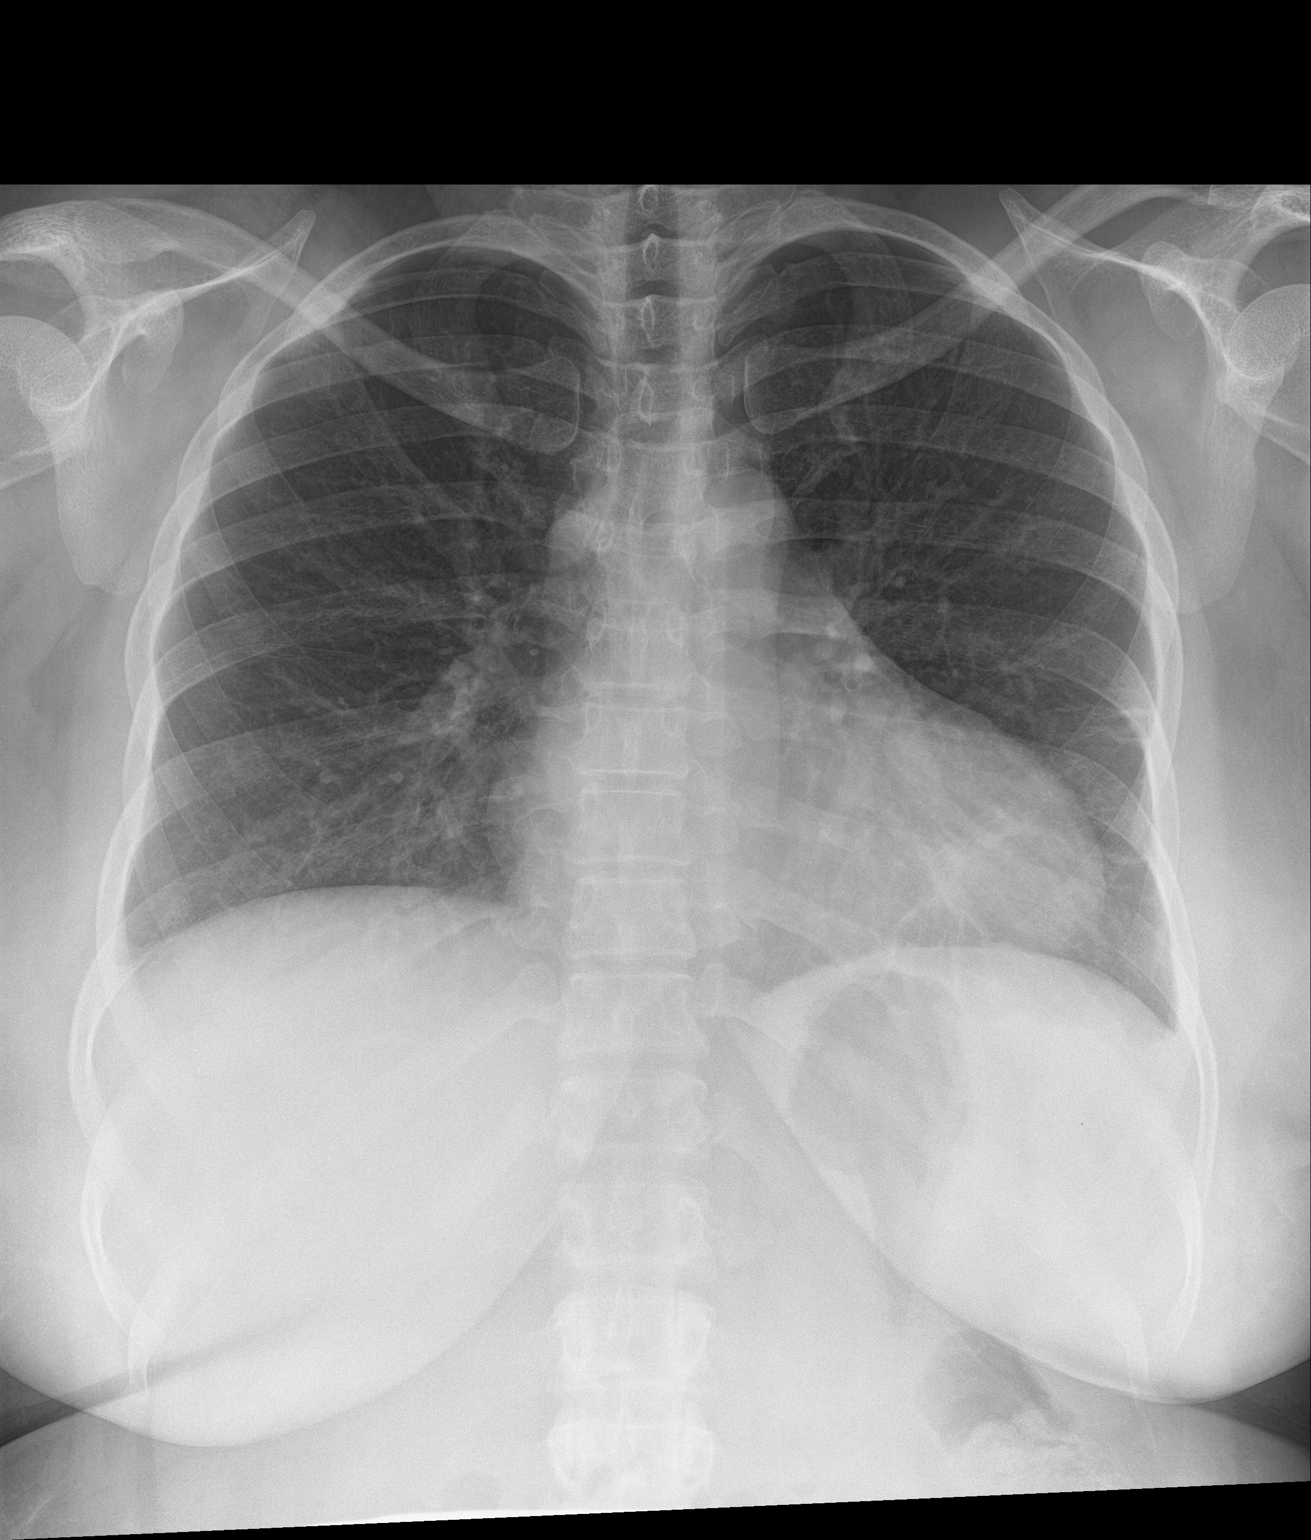

[chest lat]
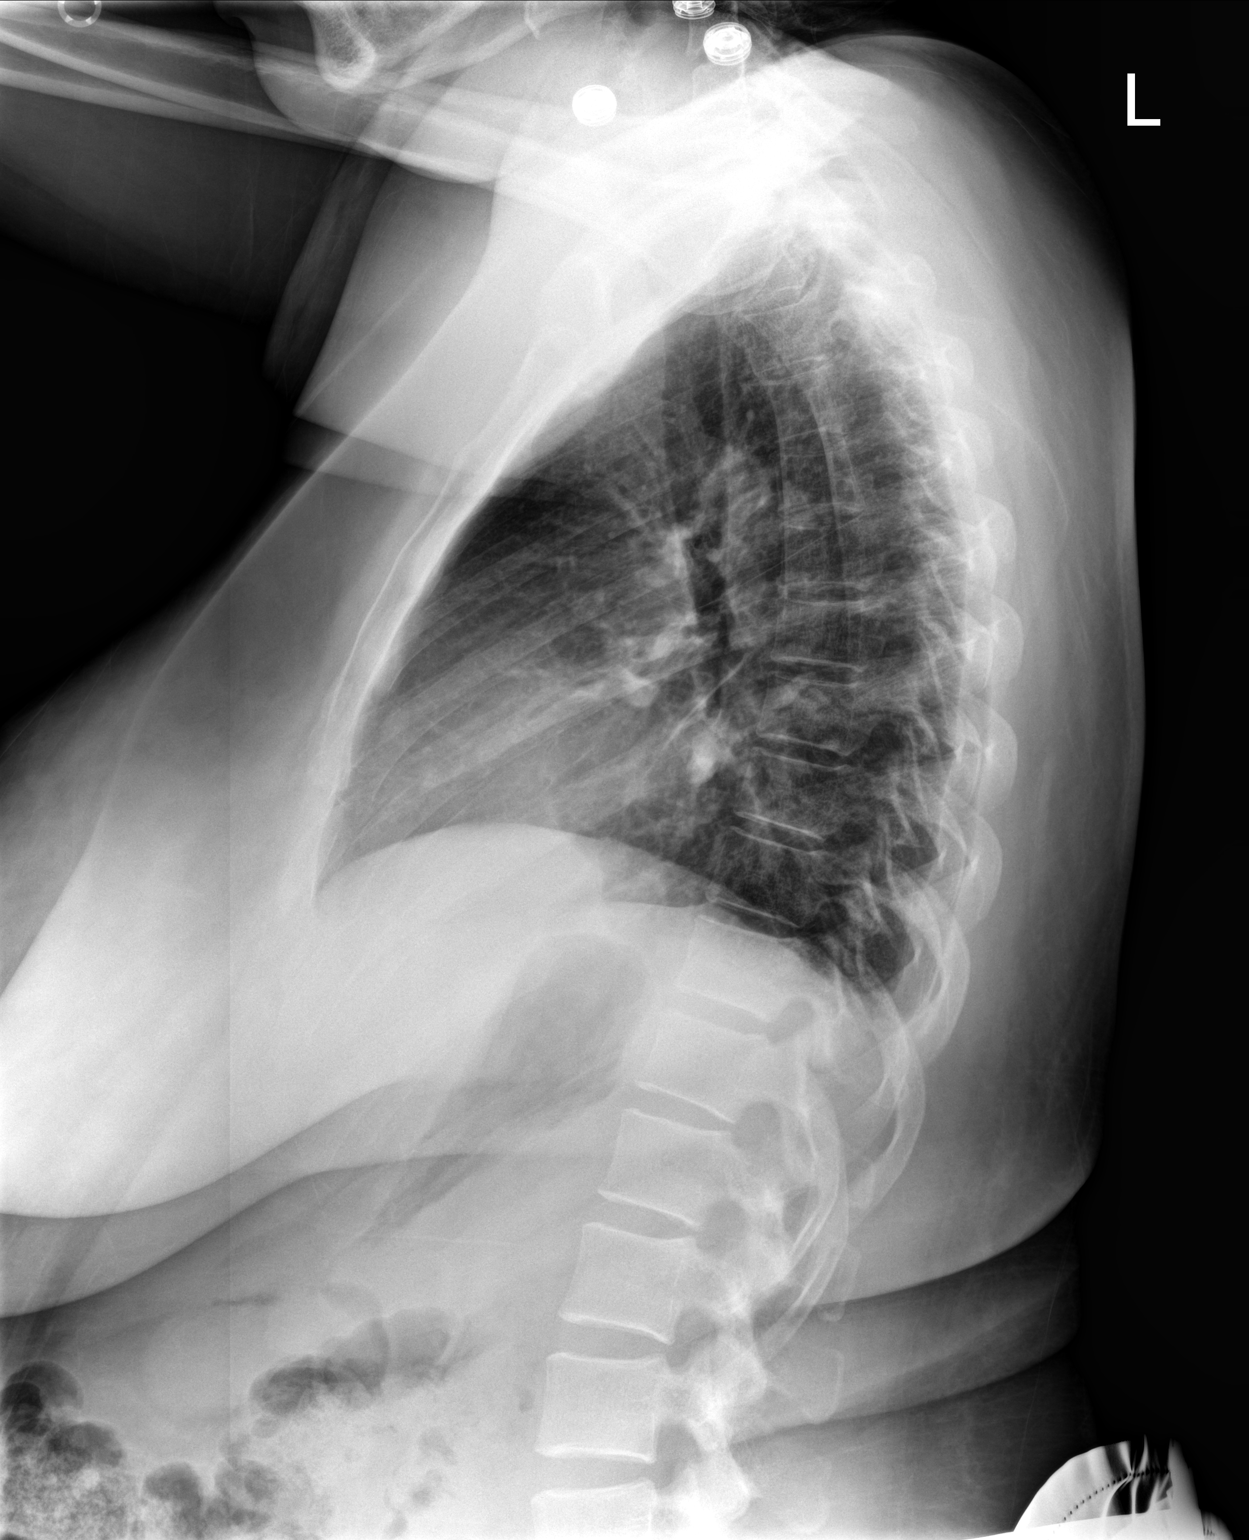

[2 of 2 positions shown; findings below may reference images not displayed]

FINDINGS: Linear atelectasis in the left base. Right lung is clear. Heart is
borderline in size, likely related to post partum state. No
effusions or acute bony abnormality.
IMPRESSION: Left base atelectasis.

## 2017-09-05 ENCOUNTER — Other Ambulatory Visit: Payer: Self-pay | Admitting: Allergy and Immunology

## 2017-09-05 DIAGNOSIS — B999 Unspecified infectious disease: Secondary | ICD-10-CM | POA: Diagnosis not present

## 2017-09-10 LAB — STREP PNEUMONIAE 23 SEROTYPES IGG
Pneumo Ab Type 1*: 28 ug/mL (ref >1.3)
Pneumo Ab Type 12 (12F)*: <0.1 ug/mL — ABNORMAL LOW (ref >1.3)
Pneumo Ab Type 14*: 2.6 ug/mL (ref >1.3)
Pneumo Ab Type 17 (17F)*: 4.4 ug/mL (ref >1.3)
Pneumo Ab Type 19 (19F)*: 2.9 ug/mL (ref >1.3)
Pneumo Ab Type 2*: 9.2 ug/mL (ref >1.3)
Pneumo Ab Type 20*: >54.6 ug/mL (ref >1.3)
Pneumo Ab Type 22 (22F)*: 0.4 ug/mL — ABNORMAL LOW (ref >1.3)
Pneumo Ab Type 23 (23F)*: >29.1 ug/mL (ref >1.3)
Pneumo Ab Type 26 (6B)*: <0.1 ug/mL — ABNORMAL LOW (ref >1.3)
Pneumo Ab Type 3*: 3.5 ug/mL (ref >1.3)
Pneumo Ab Type 34 (10A)*: <0.1 ug/mL — ABNORMAL LOW (ref >1.3)
Pneumo Ab Type 4*: 1.2 ug/mL — ABNORMAL LOW (ref >1.3)
Pneumo Ab Type 43 (11A)*: 3.5 ug/mL (ref >1.3)
Pneumo Ab Type 5*: 0.6 ug/mL — ABNORMAL LOW (ref >1.3)
Pneumo Ab Type 51 (7F)*: 4.5 ug/mL (ref >1.3)
Pneumo Ab Type 54 (15B)*: 3.3 ug/mL (ref >1.3)
Pneumo Ab Type 56 (18C)*: 0.4 ug/mL — ABNORMAL LOW (ref >1.3)
Pneumo Ab Type 57 (19A)*: 6.8 ug/mL (ref >1.3)
Pneumo Ab Type 68 (9V)*: 18.7 ug/mL (ref >1.3)
Pneumo Ab Type 70 (33F)*: >9 ug/mL (ref >1.3)
Pneumo Ab Type 8*: >27.2 ug/mL (ref >1.3)
Pneumo Ab Type 9 (9N)*: 5.3 ug/mL (ref >1.3)

## 2017-10-16 DIAGNOSIS — Z23 Encounter for immunization: Secondary | ICD-10-CM | POA: Diagnosis not present

## 2018-01-14 DIAGNOSIS — Z30432 Encounter for removal of intrauterine contraceptive device: Secondary | ICD-10-CM | POA: Diagnosis not present

## 2018-01-20 DIAGNOSIS — J069 Acute upper respiratory infection, unspecified: Secondary | ICD-10-CM | POA: Diagnosis not present

## 2018-01-20 DIAGNOSIS — J4520 Mild intermittent asthma, uncomplicated: Secondary | ICD-10-CM | POA: Diagnosis not present

## 2018-02-06 ENCOUNTER — Encounter: Payer: Self-pay | Admitting: Internal Medicine

## 2018-02-10 DIAGNOSIS — M9903 Segmental and somatic dysfunction of lumbar region: Secondary | ICD-10-CM | POA: Diagnosis not present

## 2018-02-10 DIAGNOSIS — M5386 Other specified dorsopathies, lumbar region: Secondary | ICD-10-CM | POA: Diagnosis not present

## 2018-02-10 DIAGNOSIS — M9902 Segmental and somatic dysfunction of thoracic region: Secondary | ICD-10-CM | POA: Diagnosis not present

## 2018-02-10 DIAGNOSIS — M9904 Segmental and somatic dysfunction of sacral region: Secondary | ICD-10-CM | POA: Diagnosis not present

## 2018-02-11 ENCOUNTER — Ambulatory Visit: Payer: BLUE CROSS/BLUE SHIELD | Admitting: Internal Medicine

## 2018-03-24 ENCOUNTER — Emergency Department (HOSPITAL_BASED_OUTPATIENT_CLINIC_OR_DEPARTMENT_OTHER): Payer: BLUE CROSS/BLUE SHIELD

## 2018-03-24 ENCOUNTER — Encounter (HOSPITAL_BASED_OUTPATIENT_CLINIC_OR_DEPARTMENT_OTHER): Payer: Self-pay | Admitting: Emergency Medicine

## 2018-03-24 ENCOUNTER — Other Ambulatory Visit: Payer: Self-pay

## 2018-03-24 ENCOUNTER — Emergency Department (HOSPITAL_BASED_OUTPATIENT_CLINIC_OR_DEPARTMENT_OTHER)
Admission: EM | Admit: 2018-03-24 | Discharge: 2018-03-24 | Disposition: A | Discharge status: Home / Self Care | Payer: BLUE CROSS/BLUE SHIELD | Attending: Emergency Medicine | Admitting: Emergency Medicine

## 2018-03-24 DIAGNOSIS — R109 Unspecified abdominal pain: Secondary | ICD-10-CM | POA: Diagnosis not present

## 2018-03-24 DIAGNOSIS — Z79899 Other long term (current) drug therapy: Secondary | ICD-10-CM | POA: Insufficient documentation

## 2018-03-24 DIAGNOSIS — R1012 Left upper quadrant pain: Secondary | ICD-10-CM | POA: Insufficient documentation

## 2018-03-24 DIAGNOSIS — J4520 Mild intermittent asthma, uncomplicated: Secondary | ICD-10-CM | POA: Insufficient documentation

## 2018-03-24 DIAGNOSIS — R1013 Epigastric pain: Secondary | ICD-10-CM | POA: Diagnosis not present

## 2018-03-24 DIAGNOSIS — R197 Diarrhea, unspecified: Secondary | ICD-10-CM | POA: Diagnosis not present

## 2018-03-24 DIAGNOSIS — Z87891 Personal history of nicotine dependence: Secondary | ICD-10-CM | POA: Diagnosis not present

## 2018-03-24 LAB — URINALYSIS, ROUTINE W REFLEX MICROSCOPIC
Bilirubin Urine: NEGATIVE
Glucose, UA: NEGATIVE mg/dL
Ketones, ur: NEGATIVE mg/dL
LEUKOCYTES UA: NEGATIVE
Nitrite: NEGATIVE
PROTEIN: NEGATIVE mg/dL
SPECIFIC GRAVITY, URINE: 1.015 (ref 1.005–1.030)
pH: 5.5 (ref 5.0–8.0)

## 2018-03-24 LAB — CBC WITH DIFFERENTIAL/PLATELET
BASOS PCT: 0 %
Basophils Absolute: 0 10*3/uL (ref 0.0–0.1)
Eosinophils Absolute: 0.1 10*3/uL (ref 0.0–0.7)
Eosinophils Relative: 1 %
HEMATOCRIT: 38 % (ref 36.0–46.0)
HEMOGLOBIN: 12.7 g/dL (ref 12.0–15.0)
LYMPHS PCT: 24 %
Lymphs Abs: 1.7 10*3/uL (ref 0.7–4.0)
MCH: 26.3 pg (ref 26.0–34.0)
MCHC: 33.4 g/dL (ref 30.0–36.0)
MCV: 78.8 fL (ref 78.0–100.0)
MONOS PCT: 6 %
Monocytes Absolute: 0.5 10*3/uL (ref 0.1–1.0)
NEUTROS ABS: 4.8 10*3/uL (ref 1.7–7.7)
NEUTROS PCT: 69 %
Platelets: 314 10*3/uL (ref 150–400)
RBC: 4.82 MIL/uL (ref 3.87–5.11)
RDW: 15 % (ref 11.5–15.5)
WBC: 7.1 10*3/uL (ref 4.0–10.5)

## 2018-03-24 LAB — COMPREHENSIVE METABOLIC PANEL
ALBUMIN: 4.5 g/dL (ref 3.5–5.0)
ALK PHOS: 69 U/L (ref 38–126)
ALT: 15 U/L (ref 14–54)
AST: 19 U/L (ref 15–41)
Anion gap: 11 (ref 5–15)
BILIRUBIN TOTAL: 0.7 mg/dL (ref 0.3–1.2)
BUN: 12 mg/dL (ref 6–20)
CO2: 24 mmol/L (ref 22–32)
Calcium: 9.3 mg/dL (ref 8.9–10.3)
Chloride: 104 mmol/L (ref 101–111)
Creatinine, Ser: 0.66 mg/dL (ref 0.44–1.00)
GFR calc Af Amer: >60 mL/min (ref >60)
GFR calc non Af Amer: >60 mL/min (ref >60)
GLUCOSE: 124 mg/dL — AB (ref 65–99)
Potassium: 3.7 mmol/L (ref 3.5–5.1)
Sodium: 139 mmol/L (ref 135–145)
TOTAL PROTEIN: 8.3 g/dL — AB (ref 6.5–8.1)

## 2018-03-24 LAB — URINALYSIS, MICROSCOPIC (REFLEX)

## 2018-03-24 LAB — LIPASE, BLOOD: Lipase: 26 U/L (ref 11–51)

## 2018-03-24 LAB — PREGNANCY, URINE: Preg Test, Ur: NEGATIVE

## 2018-03-24 MED ORDER — MORPHINE SULFATE (PF) 4 MG/ML IV SOLN
4.0000 mg | Freq: Once | INTRAVENOUS | Status: AC
  Administered 2018-03-24: 4 mg via INTRAVENOUS
  Filled 2018-03-24: qty 1

## 2018-03-24 MED ORDER — KETOROLAC TROMETHAMINE 30 MG/ML IJ SOLN
30.0000 mg | Freq: Once | INTRAMUSCULAR | Status: AC
  Administered 2018-03-24: 30 mg via INTRAVENOUS
  Filled 2018-03-24: qty 1

## 2018-03-24 MED ORDER — GI COCKTAIL ~~LOC~~
30.0000 mL | Freq: Once | ORAL | Status: AC
  Administered 2018-03-24: 30 mL via ORAL
  Filled 2018-03-24: qty 30

## 2018-03-24 MED ORDER — SODIUM CHLORIDE 0.9 % IV BOLUS
1000.0000 mL | Freq: Once | INTRAVENOUS | Status: AC
  Administered 2018-03-24: 1000 mL via INTRAVENOUS

## 2018-03-24 MED ORDER — ONDANSETRON HCL 4 MG PO TABS: 4.0000 mg | ORAL_TABLET | Freq: Four times a day (QID) | ORAL | 0 refills | Status: DC

## 2018-03-24 MED ORDER — MORPHINE SULFATE (PF) 4 MG/ML IV SOLN
4.0000 mg | Freq: Once | INTRAVENOUS | Status: AC
Start: 2018-03-24 — End: 2018-03-24
  Administered 2018-03-24: 4 mg via INTRAVENOUS
  Filled 2018-03-24: qty 1

## 2018-03-24 MED ORDER — HYDROCODONE-ACETAMINOPHEN 5-325 MG PO TABS: 2.0000 | ORAL_TABLET | ORAL | 0 refills | Status: DC | PRN

## 2018-03-24 NOTE — ED Triage Notes (Signed)
Patient states that she woke up Friday with left upper quadrant pain. The patient denies any N/V

## 2018-03-24 NOTE — ED Provider Notes (Signed)
[facility] EMERGENCY DEPARTMENT Provider Note   CSN: [id number] Arrival date & time: 03/24/18  [id number]     History   Chief Complaint Chief Complaint  Patient presents with  . Abdominal Pain    HPI Holly Jackson is a 37 y.o. female.  The history is provided by the patient, medical records and a significant other. No language interpreter was used.  Abdominal Pain   This is a new problem. The current episode started more than 2 days ago. The problem occurs constantly. The problem has not changed since onset.The pain is associated with an unknown factor. The pain is located in the LUQ. The quality of the pain is aching and cramping. The pain is at a severity of 8/10. The pain is severe. Associated symptoms include diarrhea. Pertinent negatives include fever, nausea, vomiting, constipation, dysuria, hematuria and headaches. The symptoms are aggravated by palpation. Nothing relieves the symptoms.  Diarrhea   Associated symptoms include abdominal pain. Pertinent negatives include no vomiting, no chills, no headaches and no cough.    Past Medical History:  Diagnosis Date  . Anxiety   . Cholestasis of pregnancy   . Vaginal Pap smear, abnormal    pos HPV    Patient Active Problem List   Diagnosis Date Noted  . Dermatitis 05/23/2017  . Recurrent infections 05/23/2017  . Perennial and seasonal allergic rhinitis 05/23/2017  . Mild persistent asthma 05/23/2017  . Atelectasis of left lung 03/07/2017  . Status post repeat low transverse cesarean section 03/04/2017  . Allergy to penicillin - causes SOB; mild resp distress 03/01/2017  . Allergy to sulfa drugs - mod-severe rash 03/01/2017  . History of depression 03/01/2017  . AMA (advanced maternal age) multigravida 35+, third trimester 03/01/2017  . Frank breech presentation 03/01/2017  . Velamentous insertion of umbilical cord 03/01/2017  . Placenta succenturiate lobe affecting fetus 03/01/2017  . Previous cesarean  delivery, antepartum condition or complication 09/22/2014  . Obesity, unspecified 09/17/2014  . Generalized anxiety disorder 09/17/2014  . Hx of abdominoplasty 09/17/2014    Past Surgical History:  Procedure Laterality Date  . ABDOMINOPLASTY    . CESAREAN SECTION N/A 09/21/2014   Procedure: CESAREAN SECTION;  Surgeon: Konrad FelixEma Wakuru Kulwa, MD;  Location: [facility];  Service: Obstetrics;  Laterality: N/A;  . CESAREAN SECTION N/A 03/04/2017   Procedure: CESAREAN SECTION;  Surgeon: Silverio LaySandra Rivard, MD;  [facility];  Service: Obstetrics;  Laterality: N/A;     OB History    Gravida  2   Para  2   Term  2   Preterm      AB      Living  2     SAB      TAB      Ectopic      Multiple  0   Live Births  2            Home Medications    Prior to Admission medications   Medication Sig Start Date End Date Taking? Authorizing Provider  acetaminophen (TYLENOL) 500 MG tablet Take 500 mg by mouth every 6 (six) hours as needed for mild pain or moderate pain.    [provider]  albuterol (PROVENTIL HFA;VENTOLIN HFA) 108 (90 Base) MCG/ACT inhaler Inhale 2 puffs into the lungs every 6 (six) hours as needed for wheezing or shortness of breath. 05/23/17   Bobbitt, Heywood Ilesalph Carter, MD  cyclobenzaprine (FLEXERIL) 10 MG tablet Take 1 tablet (10 mg total) by mouth 3 (three) times  daily as needed for muscle spasms. Patient not taking: Reported on 05/23/2017 03/07/17   Nigel Bridgeman, CNM  Fluticasone Propionate Timmothy Sours) 93 MCG/ACT EXHU Place 2 sprays into the nose 2 (two) times daily. 05/23/17   Bobbitt, Heywood Iles, MD  hydrOXYzine (ATARAX/VISTARIL) 25 MG tablet Take 25 mg by mouth daily as needed for itching.    [provider]  ibuprofen (ADVIL,MOTRIN) 600 MG tablet Take 1 tablet (600 mg total) by mouth every 6 (six) hours. Patient not taking: Reported on 05/23/2017 03/07/17   Nigel Bridgeman, CNM  montelukast (SINGULAIR) 10 MG tablet Take 1 tablet (10 mg total) by mouth  at bedtime. 05/23/17   Bobbitt, Heywood Iles, MD  oxyCODONE-acetaminophen (PERCOCET/ROXICET) 5-325 MG tablet Take 2 tablets by mouth every 6 (six) hours as needed (pain scale > 7). Patient not taking: Reported on 05/23/2017 03/07/17   Nigel Bridgeman, CNM  pantoprazole (PROTONIX) 20 MG tablet Take 20 mg by mouth daily.  01/10/17   [provider]  Prenatal Vit-Fe Fumarate-FA (PRENATAL MULTIVITAMIN) TABS tablet Take 1 tablet by mouth daily at 12 noon.    [provider]  zolpidem (AMBIEN) 5 MG tablet Take 1 tablet (5 mg total) by mouth at bedtime as needed for sleep. 03/07/17   Nigel Bridgeman, CNM    Family History Family History  Problem Relation Age of Onset  . Miscarriages / Stillbirths Sister   . Allergic rhinitis Neg Hx   . Angioedema Neg Hx   . Asthma Neg Hx   . Eczema Neg Hx   . Immunodeficiency Neg Hx   . Urticaria Neg Hx     Social History Social History   Tobacco Use  . Smoking status: Former Games developer  . Smokeless tobacco: Never Used  Substance Use Topics  . Alcohol use: No  . Drug use: No     Allergies   Penicillins and Sulfa antibiotics   Review of Systems Review of Systems  Constitutional: Negative for chills, diaphoresis, fatigue and fever.  HENT: Negative for congestion.   Eyes: Negative for visual disturbance.  Respiratory: Negative for cough, chest tightness, shortness of breath and wheezing.   Cardiovascular: Negative for chest pain and palpitations.  Gastrointestinal: Positive for abdominal pain and diarrhea. Negative for abdominal distention, constipation, nausea and vomiting.  Genitourinary: Positive for flank pain. Negative for decreased urine volume, dyspareunia, dysuria, hematuria, vaginal bleeding, vaginal discharge and vaginal pain.  Musculoskeletal: Negative for back pain, neck pain and neck stiffness.  Skin: Negative for rash.  Neurological: Negative for light-headedness and headaches.  Psychiatric/Behavioral: Negative for agitation.    All other systems reviewed and are negative.    Physical Exam Updated Vital Signs BP 124/82 (BP Location: Right Arm)   Pulse 91   Temp 98.3 F (36.8 C) (Oral)   Resp 16   Ht 5\' 2"  (1.575 m)   Wt 79.8 kg (176 lb)   LMP 03/10/2018   SpO2 100%   BMI 32.19 kg/m   Physical Exam  Constitutional: She is oriented to person, place, and time. She appears well-developed and well-nourished.  Non-toxic appearance. She does not appear ill. No distress.  HENT:  Head: Normocephalic and atraumatic.  Mouth/Throat: Oropharynx is clear and moist. No oropharyngeal exudate.  Eyes: Pupils are equal, round, and reactive to light. Conjunctivae and EOM are normal. No scleral icterus.  Neck: Normal range of motion.  Cardiovascular: Normal rate and normal heart sounds. Exam reveals no friction rub.  No murmur heard. Pulmonary/Chest: Effort normal. No stridor. No respiratory  distress. She has no wheezes. She exhibits no tenderness.  Abdominal: Soft. Normal appearance. She exhibits no distension. There is tenderness in the epigastric area and left upper quadrant. There is no rigidity, no rebound and no CVA tenderness.    Musculoskeletal: She exhibits no tenderness.  Neurological: She is alert and oriented to person, place, and time. No cranial nerve deficit or sensory deficit. She exhibits normal muscle tone.  Skin: Capillary refill takes less than 2 seconds. No rash noted. She is not diaphoretic. No erythema.  Psychiatric: She has a normal mood and affect.  Nursing note and vitals reviewed.    ED Treatments / Results  Labs (all labs ordered are listed, but only abnormal results are displayed) Labs Reviewed  URINALYSIS, ROUTINE W REFLEX MICROSCOPIC - Abnormal; Notable for the following components:      Result Value   Hgb urine dipstick TRACE (*)    All other components within normal limits  COMPREHENSIVE METABOLIC PANEL - Abnormal; Notable for the following components:   Glucose, Bld 124 (*)     Total Protein 8.3 (*)    All other components within normal limits  URINALYSIS, MICROSCOPIC (REFLEX) - Abnormal; Notable for the following components:   Bacteria, UA MANY (*)    Squamous Epithelial / LPF 6-30 (*)    All other components within normal limits  URINE CULTURE  PREGNANCY, URINE  CBC WITH DIFFERENTIAL/PLATELET  LIPASE, BLOOD    EKG None  Radiology Ct Renal Stone Study  Result Date: 03/24/2018 CLINICAL DATA:  Left flank pain hematuria EXAM: CT ABDOMEN AND PELVIS WITHOUT CONTRAST TECHNIQUE: Multidetector CT imaging of the abdomen and pelvis was performed following the standard protocol without IV contrast. COMPARISON:  None. FINDINGS: Lower chest: 5 mm right middle lobe nodule. Remaining lung bases clear Hepatobiliary: Normal liver.  Normal gallbladder and bile ducts. Pancreas: Negative Spleen: Negative Adrenals/Urinary Tract: Negative for urinary tract calculi. Negative for renal obstruction or mass. Normal bladder. Stomach/Bowel: Negative for bowel obstruction. Small hiatal hernia. Negative for bowel mass or edema. Normal appendix Vascular/Lymphatic: Negative Reproductive: Normal uterus.  Negative for pelvic mass. Other: No free fluid Musculoskeletal: Negative IMPRESSION: Negative for urinary tract calculi.  No acute abnormality 5 mm right middle lobe nodule. No follow-up needed if patient is low-risk. Non-contrast chest CT can be considered in 12 months if patient is high-risk. This recommendation follows the consensus statement: Guidelines for Management of Incidental Pulmonary Nodules Detected on CT Images: From the Fleischner Society 2017; Radiology 2017; 284:228-243. Electronically Signed   By: Marlan Palau M.D.   On: 03/24/2018 10:50    Procedures Procedures (including critical care time)  Medications Ordered in ED Medications  sodium chloride 0.9 % bolus 1,000 mL (0 mLs Intravenous Stopped 03/24/18 1027)  morphine 4 MG/ML injection 4 mg (4 mg Intravenous Given 03/24/18 0927)   gi cocktail (Maalox,Lidocaine,Donnatal) (30 mLs Oral Given 03/24/18 0927)  ketorolac (TORADOL) 30 MG/ML injection 30 mg (30 mg Intravenous Given 03/24/18 1051)  morphine 4 MG/ML injection 4 mg (4 mg Intravenous Given 03/24/18 1052)     Initial Impression / Assessment and Plan / ED Course  I have reviewed the triage vital signs and the nursing notes.  Pertinent labs & imaging results that were available during my care of the patient were reviewed by me and considered in my medical decision making (see chart for details).     Holly Jackson is a 37 y.o. female with a past medical history significant for pregnancy related cholestasis who presents  with diarrhea and left upper quadrant abdominal pain.  Patient reports that she has had pain for the last few days.  She has not had any nausea or vomiting but has had some loose bowel movements that were nonbloody.  She denies any urinary symptoms and reports her pain is cramping and aching.  She reports it is moderate to severe.  She denies history of pancreatitis or other intra-abdominal problems.  She denies recent trauma.  She denies any fevers, chills, congestion, or cough.  She denies other complaints on arrival.  On exam, patient had moderate tenderness in her left upper quadrant and left flank area.  Patient's back was nontender.  No CVA tenderness.  Lungs were clear.  Chest was nontender.  No focal neurologic deficits seen.  Legs not edematous.    Given patient's left flank pain and left upper quadrant pain patient had workup including blood work and urinalysis.  Patient was found to have some hematuria.  As patient reports she could not get comfortable, the pain does sound somewhat like a kidney stone.  CT stone study was ordered and did not see any evidence of kidney stones.  Patient was found to have a small nodule which the patient was informed of.  No evidence of pancreas ab normality, spleen ab normality, or diverticulitis.  Laboratory  testing was overall reassuring.  Patient reported some improvement in pain with pain medication.    Given reassuring workup, patient felt stable for discharge home with abdominal pain likely related to loose bowel movements and diarrhea.  Patient will give prescription for pain medicine as this helped and some nausea medicine to maintain hydration.  Patient will follow up with PCP in several days.  Patient understood return precautions and plan of care.  Patient was discharged in good condition.     Final Clinical Impressions(s) / ED Diagnoses   Final diagnoses:  Left upper quadrant pain  Diarrhea, unspecified type    ED Discharge Orders        Ordered    HYDROcodone-acetaminophen (NORCO/VICODIN) 5-325 MG tablet  Every 4 hours PRN     03/24/18 1431    ondansetron (ZOFRAN) 4 MG tablet  Every 6 hours     03/24/18 1431      Clinical Impression: 1. Left upper quadrant pain   2. Diarrhea, unspecified type     Disposition: Discharge  Condition: Good  I have discussed the results, Dx and Tx plan with the pt(& family if present). He/she/they expressed understanding and agree(s) with the plan. Discharge instructions discussed at great length. Strict return precautions discussed and pt &/or family have verbalized understanding of the instructions. No further questions at time of discharge.    New Prescriptions   HYDROCODONE-ACETAMINOPHEN (NORCO/VICODIN) 5-325 MG TABLET    Take 2 tablets by mouth every 4 (four) hours as needed.   ONDANSETRON (ZOFRAN) 4 MG TABLET    Take 1 tablet (4 mg total) by mouth every 6 (six) hours.    Follow Up: [facility] [address] [place] [address] [phone] Schedule an appointment as soon as possible for a visit    [facility] [place]PARTMENT [address] [id number] [address] [place] [address] [phone]       Tegeler, Canary Brim, MD 03/24/18  [phone]

## 2018-03-24 NOTE — Discharge Instructions (Signed)
Your workup today did not show evidence of kidney stone, urinary tract infection, pancreatitis, or other concerning causes of your pain.  We suspect you are having pain related to the diarrhea and loose bowel movement.  Please stay hydrated and use the nausea medicine to help with any vomiting.  Please use the pain medicine to help with the discomfort and follow-up with your primary doctor in several days.  If any symptoms change or worsen, please return to the nearest emergency department.

## 2018-03-25 DIAGNOSIS — R194 Change in bowel habit: Secondary | ICD-10-CM | POA: Diagnosis not present

## 2018-03-25 DIAGNOSIS — R1012 Left upper quadrant pain: Secondary | ICD-10-CM | POA: Diagnosis not present

## 2018-03-25 LAB — URINE CULTURE: CULTURE: NO GROWTH

## 2018-05-14 ENCOUNTER — Encounter: Payer: Self-pay | Admitting: Osteopathic Medicine

## 2018-05-14 ENCOUNTER — Ambulatory Visit (INDEPENDENT_AMBULATORY_CARE_PROVIDER_SITE_OTHER): Payer: BLUE CROSS/BLUE SHIELD | Admitting: Osteopathic Medicine

## 2018-05-14 VITALS — BP 128/85 | HR 91 | Temp 98.1°F | Wt 185.4 lb

## 2018-05-14 DIAGNOSIS — R635 Abnormal weight gain: Secondary | ICD-10-CM

## 2018-05-14 DIAGNOSIS — R05 Cough: Secondary | ICD-10-CM | POA: Diagnosis not present

## 2018-05-14 DIAGNOSIS — K582 Mixed irritable bowel syndrome: Secondary | ICD-10-CM

## 2018-05-14 DIAGNOSIS — J4540 Moderate persistent asthma, uncomplicated: Secondary | ICD-10-CM | POA: Diagnosis not present

## 2018-05-14 DIAGNOSIS — R059 Cough, unspecified: Secondary | ICD-10-CM

## 2018-05-14 MED ORDER — FLUTICASONE FUROATE 100 MCG/ACT IN AEPB: 1.0000 | INHALATION_SPRAY | Freq: Every day | RESPIRATORY_TRACT | 11 refills | Status: DC

## 2018-05-14 NOTE — Progress Notes (Signed)
HPI: Holly Jackson is a 37 y.o. female who  has a past medical history of Anxiety, Cholestasis of pregnancy, and Vaginal Pap smear, abnormal.  she presents to [facility] today, 05/14/18,  for chief complaint of: Establish primary care  Coughing Irritable Bowel recent diagnosis   Recent ER visit 03/24/18 abd pain and diarrhea x2 days. Hx abdominoplasty, C/S x2. Hematuria, CT no renal stone but incidental finding 5 mm lung nodule - no f/u needed if low risk.   Gi visit - bentyl helping. Has had problems with diarrhea/constipation since getting IUD removed.   Cough bothering her more past few weeks/month, hx asthma, rescue inhaler only  Weight gain has been a concern for her, she would like some help with appetite suppression, she struggles with this, working on getting more exercise.      Past medical, surgical, social and family history reviewed:  Patient Active Problem List   Diagnosis Date Noted  . Dermatitis 05/23/2017  . Recurrent infections 05/23/2017  . Perennial and seasonal allergic rhinitis 05/23/2017  . Mild persistent asthma 05/23/2017  . Atelectasis of left lung 03/07/2017  . Status post repeat low transverse cesarean section 03/04/2017  . Allergy to penicillin - causes SOB; mild resp distress 03/01/2017  . Allergy to sulfa drugs - mod-severe rash 03/01/2017  . History of depression 03/01/2017  . AMA (advanced maternal age) multigravida 35+, third trimester 03/01/2017  . Frank breech presentation 03/01/2017  . Velamentous insertion of umbilical cord 03/01/2017  . Placenta succenturiate lobe affecting fetus 03/01/2017  . Previous cesarean delivery, antepartum condition or complication 09/22/2014  . Obesity, unspecified 09/17/2014  . Generalized anxiety disorder 09/17/2014  . Hx of abdominoplasty 09/17/2014    Past Surgical History:  Procedure Laterality Date  . ABDOMINOPLASTY    . CESAREAN SECTION N/A 09/21/2014    Procedure: CESAREAN SECTION;  Surgeon: Konrad Felix, MD;  Location: [facility];  Service: Obstetrics;  Laterality: N/A;  . CESAREAN SECTION N/A 03/04/2017   Procedure: CESAREAN SECTION;  Surgeon: Silverio Lay, MD;  Location: [facility];  Service: Obstetrics;  Laterality: N/A;    Social History   Tobacco Use  . Smoking status: Former Games developer  . Smokeless tobacco: Never Used  Substance Use Topics  . Alcohol use: No    Family History  Problem Relation Age of Onset  . Miscarriages / Stillbirths Sister   . Allergic rhinitis Neg Hx   . Angioedema Neg Hx   . Asthma Neg Hx   . Eczema Neg Hx   . Immunodeficiency Neg Hx   . Urticaria Neg Hx      Current medication list and allergy/intolerance information reviewed:    Current Outpatient Medications  Medication Sig Dispense Refill  . acetaminophen (TYLENOL) 500 MG tablet Take 500 mg by mouth every 6 (six) hours as needed for mild pain or moderate pain.    Marland Kitchen albuterol (PROVENTIL HFA;VENTOLIN HFA) 108 (90 Base) MCG/ACT inhaler Inhale 2 puffs into the lungs every 6 (six) hours as needed for wheezing or shortness of breath. 1 Inhaler 1  . hydrOXYzine (ATARAX/VISTARIL) 25 MG tablet Take 25 mg by mouth daily as needed for itching.    Marland Kitchen ibuprofen (ADVIL,MOTRIN) 600 MG tablet Take 1 tablet (600 mg total) by mouth every 6 (six) hours. 30 tablet 2  . montelukast (SINGULAIR) 10 MG tablet Take 1 tablet (10 mg total) by mouth at bedtime. 30 tablet 5  . pantoprazole (PROTONIX) 20 MG tablet Take 20 mg by  mouth daily.   0  . cyclobenzaprine (FLEXERIL) 10 MG tablet Take 1 tablet (10 mg total) by mouth 3 (three) times daily as needed for muscle spasms. (Patient not taking: Reported on 05/14/2018) 30 tablet 0  . Fluticasone Furoate (ARNUITY ELLIPTA) 100 MCG/ACT AEPB Inhale 1 puff into the lungs daily. 30 each 11  . Fluticasone Propionate (XHANCE) 93 MCG/ACT EXHU Place 2 sprays into the nose 2 (two) times daily. 48 mL 3  . HYDROcodone-acetaminophen  (NORCO/VICODIN) 5-325 MG tablet Take 2 tablets by mouth every 4 (four) hours as needed. (Patient not taking: Reported on 05/14/2018) 10 tablet 0  . ondansetron (ZOFRAN) 4 MG tablet Take 1 tablet (4 mg total) by mouth every 6 (six) hours. (Patient not taking: Reported on 05/14/2018) 12 tablet 0  . oxyCODONE-acetaminophen (PERCOCET/ROXICET) 5-325 MG tablet Take 2 tablets by mouth every 6 (six) hours as needed (pain scale > 7). (Patient not taking: Reported on 05/14/2018) 30 tablet 0  . Prenatal Vit-Fe Fumarate-FA (PRENATAL MULTIVITAMIN) TABS tablet Take 1 tablet by mouth daily at 12 noon.    Marland Kitchen zolpidem (AMBIEN) 5 MG tablet Take 1 tablet (5 mg total) by mouth at bedtime as needed for sleep. (Patient not taking: Reported on 05/14/2018) 10 tablet 0   No current facility-administered medications for this visit.     Allergies  Allergen Reactions  . Penicillins Shortness Of Breath    Has patient had a PCN reaction causing immediate rash, facial/tongue/throat swelling, SOB or lightheadedness with hypotension: Yes Has patient had a PCN reaction causing severe rash involving mucus membranes or skin necrosis: No Has patient had a PCN reaction that required hospitalization No Has patient had a PCN reaction occurring within the last 10 years: No If all of the above answers are "NO", then may proceed with Cephalosporin use.   . Sulfa Antibiotics Rash      Review of Systems:  Constitutional:  No  fever, no chills, No recent illness, +unintentional weight changes. + significant fatigue.   HEENT: No  headache, no vision change, no hearing change, No sore throat, No  sinus pressure  Cardiac: No  chest pain, No  pressure, No palpitations, No  Orthopnea  Respiratory:  No  shortness of breath. +Cough  Gastrointestinal: +abdominal pain, No  nausea, No  vomiting,  No  blood in stool, +diarrhea, +constipation   Musculoskeletal: No new myalgia/arthralgia  Skin: No  Rash, No other wounds/concerning  lesions  Genitourinary: No  incontinence, No  abnormal genital bleeding, No abnormal genital discharge  Hem/Onc: No  easy bruising/bleeding, No  abnormal lymph node  Endocrine: No cold intolerance,  No heat intolerance. No polyuria/polydipsia/polyphagia   Neurologic: No  weakness, No  dizziness  Psychiatric: No  concerns with depression, No  concerns with anxiety, No sleep problems, No mood problems  Exam:  BP 128/85 (BP Location: Right Arm, Patient Position: Sitting, Cuff Size: Normal)   Pulse 91   Temp 98.1 F (36.7 C) (Oral)   Wt 185 lb 6.4 oz (84.1 kg)   BMI 33.91 kg/m   Constitutional: VS see above. General Appearance: alert, well-developed, well-nourished, NAD  Eyes: Normal lids and conjunctive, non-icteric sclera  Ears, Nose, Mouth, Throat: MMM, Normal external inspection ears/nares/mouth/lips/gums.   Neck: No masses, trachea midline. No thyroid enlargement. No tenderness/mass appreciated. No lymphadenopathy  Respiratory: Normal respiratory effort. no wheeze, no rhonchi, no rales  Cardiovascular: S1/S2 normal, no murmur, no rub/gallop auscultated. RRR. No lower extremity edema.  Gastrointestinal: Nontender, no masses. No hepatomegaly, no  splenomegaly. No hernia appreciated. Bowel sounds normal. Rectal exam deferred.   Musculoskeletal: Gait normal. No clubbing/cyanosis of digits.   Neurological: Normal balance/coordination. No tremor. No cranial nerve deficit on limited exam. Motor intact and symmetric.  Skin: warm, dry, intact. No rash/ulcer.   Psychiatric: Normal judgment/insight. Normal mood and affect. Oriented x3.      ASSESSMENT/PLAN:   Weight gain - lengthy discussion lifestyle modifications as well as medication options - Plan: COMPLETE METABOLIC PANEL WITH GFR, TSH, Lipid panel  Moderate persistent asthma, unspecified whether complicated - trial ICS  Irritable bowel syndrome with both constipation and diarrhea - trial low FODMAP, continue Bentyl prn   - Plan: COMPLETE METABOLIC PANEL WITH GFR, TSH, Lipid panel  Cough - trial ICS   See pt instructions       Visit summary with medication list and pertinent instructions was printed for patient to review. All questions at time of visit were answered - patient instructed to contact office with any additional concerns. ER/RTC precautions were reviewed with the patient.   Follow-up plan: Return for recheck weight 6 weeks or so.  Note: Total time spent 45 minutes, greater than 50% of the visit was spent face-to-face counseling and coordinating care for the following: The primary encounter diagnosis was Weight gain. Diagnoses of Moderate persistent asthma, unspecified whether complicated, Irritable bowel syndrome with both constipation and diarrhea, and Cough were also pertinent to this visit.Marland Kitchen  Please note: voice recognition software was used to produce this document, and typos may escape review. Please contact Dr. Lyn Hollingshead for any needed clarifications.

## 2018-05-14 NOTE — Patient Instructions (Addendum)
Try eliminating or avoiding the following foods. You can add them back and see if they trigger your symptoms, then just avoid these in the future   2017 UpToDate Characteristics and sources of common FODMAPs  Word that corresponds to letter in acronym Compounds in this category Foods that contain these compounds  F Fermentable  O Oligosaccharides Fructans, galacto-oligosaccharides Wheat, barley, rye, onion, leek, white part of spring onion, garlic, shallots, artichokes, beetroot, fennel, peas, chicory, pistachio, cashews, legumes, lentils, and chickpeas  D Disaccharides Lactose Milk, custard, ice cream, and yogurt  M Monosaccharides "Free fructose" (fructose in excess of glucose) Apples, pears, mangoes, cherries, watermelon, asparagus, sugar snap peas, honey, high-fructose corn syrup  A And  P Polyols Sorbitol, mannitol, maltitol, and xylitol Apples, pears, apricots, cherries, nectarines, peaches, plums, watermelon, mushrooms, cauliflower, artificially sweetened chewing gum and confectionery  FODMAPs: fermentable oligosaccharides, disaccharides, monosaccharides, and polyols. Adapted by permission from [employer]: [employer]. Lonell Face, [employer], [place]. Short-chain carbohydrates and functional gastrointestinal disorders. Am J Gastroenterol 2013; 108:707. Copyright  2013. www.nature.com/ajg. Graphic 40981 Version 2.0     For cough/asthma, let's try adding a daily inhaler and see if this helps. We might think about repeating the lung test at a later time if the inhaler isn't helping you.     Weight loss: important things to remember  It is hard work! You will have setbacks, but don't get discouraged. The goal is not short-term success, it is long-term health.   Looking at the numbers is important to track your progress and set goals, but how you are feeling and your overall health are the most important things! BMI and pounds and calories and  miles logged aren't everything - they are tools to help [place] reach your goals.  You can do this!!!   Things to remember for exercise for weight loss:   Please note - I am not a certified personal trainer. I can present you with ideas and general workout goals, but an exercise program is largely up to you. Find something you can stick with, and something you enjoy!   As you progress in your exercise regimen think about gradually increasing the following, week by week:   intensity (how strenuous is your workout)  frequency (how often you are exercising)  duration (how many minutes at a time you are exercising)  Walking for 20 minutes a day is certainly better than nothing, but more strenuous exercise will develop better cardiovascular fitness.   interval training (high-intensity alternating with low-intensity, think walk/jog rather than just walk)  muscle strengthening exercises (weight lifting, calisthenics, yoga) - this also helps prevent osteoporosis!   Things to remember for diet changes for weight loss:   Please note - I am not a certified dietician. I can present you with ideas and general diet goals, but a meal plan is largely up to you. I am happy to refer you to a dietician who can give you a detailed meal plan.  Apps/logs are crucial to track how you're eating! It's not realistic to be logging everything you eat forever, but when you're starting a healthy eating lifestyle it's very helpful, and checking in with logs now and then helps you stick to your program!   Calorie restriction with the goal weight loss of no more than one to one and a half pounds per week.   Increase lean protein such as chicken, fish, [place].   Decrease fatty foods such as dairy, butter.  Decrease sugary foods. Avoid sugary drinks such as soda or juice.  Increase fiber found in fruit and vegetables.   Medications approved for long-term use for obesity  Qsymia (Phentermine and  Topiramate)  Saxenda (Liraglutide 3 mg/day)  Contrave (Bupropion and Naltrexone)  Lorcaserin (Belviq or Belviq XR)  Orlistat (Xenical, Alli)  Bupropion (Wellbutrin) I recommend that you research the above medications and see which one(s) your insurance may or may not cover: If you call your insurance, ask them specifically what medications are on their formulary that are approved for obesity treatment. They should be able to send you a list or tell you over the phone. Remember, medications aren't magic! You MUST be diligent about lifestyle changes as well!

## 2018-05-15 ENCOUNTER — Telehealth: Payer: Self-pay

## 2018-05-15 DIAGNOSIS — J3089 Other allergic rhinitis: Secondary | ICD-10-CM

## 2018-05-15 MED ORDER — FLUTICASONE PROPIONATE 93 MCG/ACT NA EXHU: 2.0000 | INHALANT_SUSPENSION | Freq: Two times a day (BID) | NASAL | 3 refills | Status: DC

## 2018-05-15 NOTE — Telephone Encounter (Signed)
Sent!

## 2018-05-15 NOTE — Telephone Encounter (Signed)
Pt advised.

## 2018-05-15 NOTE — Telephone Encounter (Signed)
Pt called stating that pharmacy did not receive a "nasal rx for her sinuses". As per pt, rx was mentioned during her appt. If appropriate, pls send [facility] pharmacy. Thanks.

## 2018-05-16 ENCOUNTER — Telehealth: Payer: Self-pay

## 2018-05-16 NOTE — Telephone Encounter (Signed)
Pt called & was very upset - she's still waiting for Saxenda rx to be sent to pharmacy. States that provider approved med for weight loss. Visit summary does not have record of approved med noted. Pt was rude during call, stating "she doesn't understand why provider's assistant can't send a rx to their pharmacy". Pt requested to have a covering provider complete the rx.. However was informed that provider will need to place rx since she is a new pt & it's a new rx. Pt was informed of rx process. She thank me for the info, proceeded to tell me I was not helpful & then ended the call abruptly on her end. Pls advise, thanks.

## 2018-05-20 MED ORDER — LIRAGLUTIDE -WEIGHT MANAGEMENT 18 MG/3ML ~~LOC~~ SOPN: PEN_INJECTOR | SUBCUTANEOUS | 5 refills | Status: AC

## 2018-05-20 NOTE — Telephone Encounter (Signed)
Received fax from [employer] that Saxenda requires a PA. Information has been sent to the insurance company. Awaiting determination.   

## 2018-05-20 NOTE — Telephone Encounter (Signed)
Pt has been updated & is aware that Saxenda rx may need prior authorization from insurance.

## 2018-05-20 NOTE — Telephone Encounter (Signed)
Please call patient: thanks for alerting [place] to the error and my apologies for the oversight. Saxenda faxed to [facility] on file. If not covered by insurance, may take a week to approve prior authorization.   Clinical note: if any additional behavioral issues from the patient over the phone, let me know.

## 2018-05-27 NOTE — Telephone Encounter (Signed)
Holly Jackson is in the appeal process and the patient has been made aware she will receive a call when we get the determination. She voices understanding.

## 2018-06-03 NOTE — Telephone Encounter (Signed)
Received a from from [employer] that Saxenda was approved from 05/20/2018 through 09/23/2018. Form sent to scan. Pharmacy notified.

## 2018-07-11 DIAGNOSIS — Z0000 Encounter for general adult medical examination without abnormal findings: Secondary | ICD-10-CM | POA: Diagnosis not present

## 2018-07-16 ENCOUNTER — Other Ambulatory Visit (HOSPITAL_COMMUNITY): Payer: Self-pay | Admitting: Obstetrics & Gynecology

## 2018-07-16 DIAGNOSIS — E289 Ovarian dysfunction, unspecified: Secondary | ICD-10-CM

## 2018-07-17 DIAGNOSIS — E289 Ovarian dysfunction, unspecified: Secondary | ICD-10-CM | POA: Diagnosis not present

## 2018-07-21 DIAGNOSIS — E289 Ovarian dysfunction, unspecified: Secondary | ICD-10-CM | POA: Diagnosis not present

## 2018-07-21 DIAGNOSIS — Z319 Encounter for procreative management, unspecified: Secondary | ICD-10-CM | POA: Diagnosis not present

## 2018-07-22 ENCOUNTER — Ambulatory Visit (HOSPITAL_COMMUNITY): Payer: BLUE CROSS/BLUE SHIELD

## 2018-08-18 DIAGNOSIS — N979 Female infertility, unspecified: Secondary | ICD-10-CM | POA: Diagnosis not present

## 2018-08-18 DIAGNOSIS — E289 Ovarian dysfunction, unspecified: Secondary | ICD-10-CM | POA: Diagnosis not present

## 2018-08-22 DIAGNOSIS — E289 Ovarian dysfunction, unspecified: Secondary | ICD-10-CM | POA: Diagnosis not present

## 2018-08-22 DIAGNOSIS — N979 Female infertility, unspecified: Secondary | ICD-10-CM | POA: Diagnosis not present

## 2018-08-26 DIAGNOSIS — E289 Ovarian dysfunction, unspecified: Secondary | ICD-10-CM | POA: Diagnosis not present

## 2018-08-26 DIAGNOSIS — N979 Female infertility, unspecified: Secondary | ICD-10-CM | POA: Diagnosis not present

## 2018-09-10 DIAGNOSIS — N979 Female infertility, unspecified: Secondary | ICD-10-CM | POA: Diagnosis not present

## 2018-09-10 DIAGNOSIS — E289 Ovarian dysfunction, unspecified: Secondary | ICD-10-CM | POA: Diagnosis not present

## 2018-09-17 DIAGNOSIS — N979 Female infertility, unspecified: Secondary | ICD-10-CM | POA: Diagnosis not present

## 2018-09-17 DIAGNOSIS — E289 Ovarian dysfunction, unspecified: Secondary | ICD-10-CM | POA: Diagnosis not present

## 2018-09-24 DIAGNOSIS — N979 Female infertility, unspecified: Secondary | ICD-10-CM | POA: Diagnosis not present

## 2018-09-24 DIAGNOSIS — E289 Ovarian dysfunction, unspecified: Secondary | ICD-10-CM | POA: Diagnosis not present

## 2018-10-07 DIAGNOSIS — E289 Ovarian dysfunction, unspecified: Secondary | ICD-10-CM | POA: Diagnosis not present

## 2018-10-10 DIAGNOSIS — E289 Ovarian dysfunction, unspecified: Secondary | ICD-10-CM | POA: Diagnosis not present

## 2018-10-10 DIAGNOSIS — Z23 Encounter for immunization: Secondary | ICD-10-CM | POA: Diagnosis not present

## 2018-10-15 DIAGNOSIS — E289 Ovarian dysfunction, unspecified: Secondary | ICD-10-CM | POA: Diagnosis not present

## 2018-10-22 DIAGNOSIS — E289 Ovarian dysfunction, unspecified: Secondary | ICD-10-CM | POA: Diagnosis not present

## 2018-10-28 DIAGNOSIS — Z3A01 Less than 8 weeks gestation of pregnancy: Secondary | ICD-10-CM | POA: Diagnosis not present

## 2018-10-28 DIAGNOSIS — E289 Ovarian dysfunction, unspecified: Secondary | ICD-10-CM | POA: Diagnosis not present

## 2018-10-28 DIAGNOSIS — O3680X9 Pregnancy with inconclusive fetal viability, other fetus: Secondary | ICD-10-CM | POA: Diagnosis not present

## 2018-11-24 DIAGNOSIS — Z124 Encounter for screening for malignant neoplasm of cervix: Secondary | ICD-10-CM | POA: Diagnosis not present

## 2018-11-24 DIAGNOSIS — N898 Other specified noninflammatory disorders of vagina: Secondary | ICD-10-CM | POA: Diagnosis not present

## 2018-11-24 DIAGNOSIS — N925 Other specified irregular menstruation: Secondary | ICD-10-CM | POA: Diagnosis not present

## 2018-11-24 DIAGNOSIS — Z3A01 Less than 8 weeks gestation of pregnancy: Secondary | ICD-10-CM | POA: Diagnosis not present

## 2018-11-24 DIAGNOSIS — E289 Ovarian dysfunction, unspecified: Secondary | ICD-10-CM | POA: Diagnosis not present

## 2018-11-24 DIAGNOSIS — O3680X9 Pregnancy with inconclusive fetal viability, other fetus: Secondary | ICD-10-CM | POA: Diagnosis not present

## 2018-11-24 DIAGNOSIS — O26879 Cervical shortening, unspecified trimester: Secondary | ICD-10-CM | POA: Diagnosis not present

## 2018-11-24 LAB — OB RESULTS CONSOLE ABO/RH: RH Type: POSITIVE

## 2018-11-24 LAB — OB RESULTS CONSOLE RPR: RPR: NONREACTIVE

## 2018-11-24 LAB — OB RESULTS CONSOLE RUBELLA ANTIBODY, IGM: Rubella: IMMUNE

## 2018-11-24 LAB — OB RESULTS CONSOLE HIV ANTIBODY (ROUTINE TESTING): HIV: NONREACTIVE

## 2018-11-24 LAB — OB RESULTS CONSOLE HEPATITIS B SURFACE ANTIGEN: Hepatitis B Surface Ag: NEGATIVE

## 2018-11-24 LAB — OB RESULTS CONSOLE ANTIBODY SCREEN: Antibody Screen: NEGATIVE

## 2018-12-24 HISTORY — DX: Velamentous insertion of umbilical cord, unspecified trimester: O43.129

## 2018-12-25 DIAGNOSIS — O26879 Cervical shortening, unspecified trimester: Secondary | ICD-10-CM | POA: Diagnosis not present

## 2018-12-25 DIAGNOSIS — Z3A14 14 weeks gestation of pregnancy: Secondary | ICD-10-CM | POA: Diagnosis not present

## 2018-12-25 DIAGNOSIS — Z3482 Encounter for supervision of other normal pregnancy, second trimester: Secondary | ICD-10-CM | POA: Diagnosis not present

## 2018-12-25 DIAGNOSIS — Z369 Encounter for antenatal screening, unspecified: Secondary | ICD-10-CM | POA: Diagnosis not present

## 2019-01-29 DIAGNOSIS — O26872 Cervical shortening, second trimester: Secondary | ICD-10-CM | POA: Diagnosis not present

## 2019-01-29 DIAGNOSIS — Z8759 Personal history of other complications of pregnancy, childbirth and the puerperium: Secondary | ICD-10-CM | POA: Diagnosis not present

## 2019-01-29 DIAGNOSIS — Z3482 Encounter for supervision of other normal pregnancy, second trimester: Secondary | ICD-10-CM | POA: Diagnosis not present

## 2019-01-29 DIAGNOSIS — Z363 Encounter for antenatal screening for malformations: Secondary | ICD-10-CM | POA: Diagnosis not present

## 2019-01-29 DIAGNOSIS — Z3A19 19 weeks gestation of pregnancy: Secondary | ICD-10-CM | POA: Diagnosis not present

## 2019-01-29 DIAGNOSIS — E039 Hypothyroidism, unspecified: Secondary | ICD-10-CM | POA: Diagnosis not present

## 2019-01-29 DIAGNOSIS — D582 Other hemoglobinopathies: Secondary | ICD-10-CM | POA: Diagnosis not present

## 2019-02-10 DIAGNOSIS — L299 Pruritus, unspecified: Secondary | ICD-10-CM | POA: Diagnosis not present

## 2019-02-11 ENCOUNTER — Ambulatory Visit: Payer: BLUE CROSS/BLUE SHIELD | Admitting: Osteopathic Medicine

## 2019-02-11 ENCOUNTER — Encounter: Payer: Self-pay | Admitting: Osteopathic Medicine

## 2019-02-11 ENCOUNTER — Telehealth: Payer: Self-pay

## 2019-02-11 VITALS — BP 128/69 | HR 106 | Temp 98.1°F | Wt 194.5 lb

## 2019-02-11 DIAGNOSIS — R21 Rash and other nonspecific skin eruption: Secondary | ICD-10-CM | POA: Diagnosis not present

## 2019-02-11 DIAGNOSIS — M7582 Other shoulder lesions, left shoulder: Secondary | ICD-10-CM

## 2019-02-11 MED ORDER — TERBINAFINE HCL 1 % EX CREA: 1.0000 "application " | TOPICAL_CREAM | Freq: Two times a day (BID) | CUTANEOUS | 1 refills | Status: AC

## 2019-02-11 NOTE — Patient Instructions (Signed)
Trial different antifungal for one month, twice per day If rash is still bothering you at that point, please come back to the office for biopsy procedure, or we can refer to dermatologist.   See printed instructions for shoulder exercises. OK to continue Tylenol. If shoulder is not better or if it gets worse, I would recommend follow-up with one of our sports medicine specialists (Dr Denyse Amass or Dr. Cherylann Parr Dr. Karie Schwalbe) for further evaluation in 2 weeks. Just call our office and ask for an appointment for sports medicine!

## 2019-02-11 NOTE — Progress Notes (Signed)
HPI: Holly Jackson is a 38 y.o. female who  has a past medical history of Anxiety, Asthma, Cholestasis of pregnancy, Depression, IBS (irritable bowel syndrome), and Vaginal Pap smear, abnormal.  she presents to [facility] today, 02/12/19,  for chief complaint of:  Rash, skin problem   . Context: Noticed after getting a pedicure.  Had similar skin changes on her toes but these cleared up okay. . Location: Top of the right foot . Quality/Duration: Sore that has been present for about a year but just has not healed.  Tender to the touch  Patient reports she is currently [redacted] weeks pregnant.  Reports shoulder pain  . Location: L shoulder, posterior aspect  . Quality: sore, occasionally sharp pain . Duration: 1+ month       At today's visit 02/12/19 ... PMH, PSH, FH reviewed and updated as needed.  Current medication list and allergy/intolerance hx reviewed and updated as needed. (See remainder of HPI, ROS, Phys Exam below)             ASSESSMENT/PLAN: The primary encounter diagnosis was Rash and nonspecific skin eruption. A diagnosis of Tendinitis of left rotator cuff was also pertinent to this visit.    Meds ordered this encounter  Medications  . terbinafine (LAMISIL) 1 % cream    Sig: Apply 1 application topically 2 (two) times daily for 30 days.    Dispense:  42 g    Refill:  1    Patient Instructions  Trial different antifungal for one month, twice per day If rash is still bothering you at that point, please come back to the office for biopsy procedure, or we can refer to dermatologist.   See printed instructions for shoulder exercises. OK to continue Tylenol. If shoulder is not better or if it gets worse, I would recommend follow-up with one of our sports medicine specialists (Dr Denyse Amass or Dr. Cherylann Parr Dr. Karie Schwalbe) for further evaluation in 2 weeks. Just call our office and ask for an appointment for sports medicine!        Follow-up plan: Return for skin biopsy in 4 weeks if needed, shoulder follow up with sports medicine in 1-2 weeks if needed. .                                                 ################################################# ################################################# ################################################# #################################################    Current Meds  Medication Sig  . acetaminophen (TYLENOL) 500 MG tablet Take 500 mg by mouth every 6 (six) hours as needed for mild pain or moderate pain.  Marland Kitchen albuterol (PROVENTIL HFA;VENTOLIN HFA) 108 (90 Base) MCG/ACT inhaler Inhale 2 puffs into the lungs every 6 (six) hours as needed for wheezing or shortness of breath.  . Fluticasone Propionate (XHANCE) 93 MCG/ACT EXHU Place 2 sprays into the nose 2 (two) times daily.  . pantoprazole (PROTONIX) 20 MG tablet Take 20 mg by mouth daily.   . Prenatal Vit-Fe Fumarate-FA (PRENATAL MULTIVITAMIN) TABS tablet Take 1 tablet by mouth daily at 12 noon.    Allergies  Allergen Reactions  . Penicillins Shortness Of Breath    Has patient had a PCN reaction causing immediate rash, facial/tongue/throat swelling, SOB or lightheadedness with hypotension: Yes Has patient had a PCN reaction causing severe rash involving mucus membranes or skin necrosis: No Has patient had a PCN reaction that  required hospitalization No Has patient had a PCN reaction occurring within the last 10 years: No If all of the above answers are "NO", then may proceed with Cephalosporin use.    . Sulfa Antibiotics Rash  . Sulfasalazine Rash       Review of Systems:  Constitutional: No recent illness  HEENT: No  headache, no vision change  Cardiac: No  chest pain, No  pressure, No palpitations  Respiratory:  No  shortness of breath. No  Cough  Musculoskeletal: +new myalgia/arthralgia  Skin: +Rash  Neurologic: No  weakness, No   Dizziness   Exam:  BP 128/69 (BP Location: Left Arm, Patient Position: Sitting, Cuff Size: Normal)   Pulse (!) 106   Temp 98.1 F (36.7 C) (Oral)   Wt 194 lb 8 oz (88.2 kg)   LMP 02/20/2018 Comment: negative u-preg today  BMI 35.57 kg/m   Constitutional: VS see above. General Appearance: alert, well-developed, well-nourished, NAD  Eyes: Normal lids and conjunctive, non-icteric sclera  Ears, Nose, Mouth, Throat: MMM, Normal external inspection ears/nares/mouth/lips/gums.  Neck: No masses, trachea midline.   Respiratory: Normal respiratory effort.   Musculoskeletal: Gait normal. Symmetric and independent movement of all extremities. (+)empty-can on L shoulder, neg liftoff, neg cross-arm, neg speeds/yergason's  Neurological: Normal balance/coordination. No tremor.  Skin: warm, dry, intact.   Psychiatric: Normal judgment/insight. Normal mood and affect. Oriented x3.       Visit summary with medication list and pertinent instructions was printed for patient to review, patient was advised to alert [place] if any updates are needed. All questions at time of visit were answered - patient instructed to contact office with any additional concerns. ER/RTC precautions were reviewed with the patient and understanding verbalized.   Note: Total time spent 25 minutes, greater than 50% of the visit was spent face-to-face counseling and coordinating care for the following: The primary encounter diagnosis was Rash and nonspecific skin eruption. A diagnosis of Tendinitis of left rotator cuff was also pertinent to this visit.Marland Kitchen  Please note: voice recognition software was used to produce this document, and typos may escape review. Please contact Dr. Lyn Hollingshead for any needed clarifications.    Follow up plan: Return for skin biopsy in 4 weeks if needed, shoulder follow up with sports medicine in 1-2 weeks if needed. Marland Kitchen

## 2019-02-11 NOTE — Telephone Encounter (Signed)
Pt called - she states that pharmacist informed her that since Lamisil cream is an OTC med, insurance will not cover it. Pt said she tried many OTC meds with no relief. She will pick up med from pharmacy, if this was the provider's intention. If not, she is requesting a different rx. Pls advise, thanks.

## 2019-02-12 ENCOUNTER — Encounter: Payer: Self-pay | Admitting: Osteopathic Medicine

## 2019-02-12 NOTE — Telephone Encounter (Signed)
Left a detailed vm msg for pt regarding provider's note. Direct call back info provided.  

## 2019-02-12 NOTE — Telephone Encounter (Signed)
Since lamisil is a different medicine than what she has used, it may work. I'd still recommend trying this, she told me she had only taken clotrimazole, but if she has used lamisil already, that was unclear to me. If she's used it before, let me know. If not, would try it.

## 2019-02-19 DIAGNOSIS — Z362 Encounter for other antenatal screening follow-up: Secondary | ICD-10-CM | POA: Diagnosis not present

## 2019-02-19 DIAGNOSIS — Z3A22 22 weeks gestation of pregnancy: Secondary | ICD-10-CM | POA: Diagnosis not present

## 2019-03-24 DIAGNOSIS — L299 Pruritus, unspecified: Secondary | ICD-10-CM | POA: Diagnosis not present

## 2019-03-24 DIAGNOSIS — Z369 Encounter for antenatal screening, unspecified: Secondary | ICD-10-CM | POA: Diagnosis not present

## 2019-03-24 DIAGNOSIS — Q270 Congenital absence and hypoplasia of umbilical artery: Secondary | ICD-10-CM | POA: Diagnosis not present

## 2019-03-24 DIAGNOSIS — Z23 Encounter for immunization: Secondary | ICD-10-CM | POA: Diagnosis not present

## 2019-03-24 DIAGNOSIS — Z3A27 27 weeks gestation of pregnancy: Secondary | ICD-10-CM | POA: Diagnosis not present

## 2019-03-24 DIAGNOSIS — O43123 Velamentous insertion of umbilical cord, third trimester: Secondary | ICD-10-CM | POA: Diagnosis not present

## 2019-04-02 DIAGNOSIS — O99810 Abnormal glucose complicating pregnancy: Secondary | ICD-10-CM | POA: Diagnosis not present

## 2019-04-06 ENCOUNTER — Telehealth: Payer: Self-pay | Admitting: Dietician

## 2019-04-06 NOTE — Telephone Encounter (Signed)
Called patient re:  Our phone appointment tomorrow.  Obtained her e-mail address to forward her the GDM handout prior to our appointment.  Also asked that in the am she call her insurance company [employer]) and determine what her preferred blood glucose meter is and then call her obstetrician for prescriptions for the meter, strips and lancing devise.  Her insurance ends 04/08/19.   I will call her at 9:30 in the am for the consult.  Oran Rein, RD, LDN, CDE

## 2019-04-07 ENCOUNTER — Telehealth: Payer: Self-pay | Admitting: Dietician

## 2019-04-07 ENCOUNTER — Encounter: Payer: BLUE CROSS/BLUE SHIELD | Attending: Obstetrics and Gynecology | Admitting: Dietician

## 2019-04-07 ENCOUNTER — Other Ambulatory Visit: Payer: Self-pay

## 2019-04-07 ENCOUNTER — Encounter: Payer: Self-pay | Admitting: Dietician

## 2019-04-07 DIAGNOSIS — O24410 Gestational diabetes mellitus in pregnancy, diet controlled: Secondary | ICD-10-CM | POA: Insufficient documentation

## 2019-04-07 DIAGNOSIS — O43123 Velamentous insertion of umbilical cord, third trimester: Secondary | ICD-10-CM | POA: Diagnosis not present

## 2019-04-07 DIAGNOSIS — O24419 Gestational diabetes mellitus in pregnancy, unspecified control: Secondary | ICD-10-CM | POA: Diagnosis not present

## 2019-04-07 DIAGNOSIS — Z3A29 29 weeks gestation of pregnancy: Secondary | ICD-10-CM | POA: Diagnosis not present

## 2019-04-07 NOTE — Progress Notes (Addendum)
Medical Nutrition Therapy:  Appt start time: 0945 end time:  1045.   Assessment:  Primary concerns today:   Visit today is done via the phone due to COVID-19. She has newly diagnosed GDM. She is 29 weeks 4 days gestation.  She had cholestasis with her first pregnancy and a cord issue with her second.   Current weigh 198 lbs and 168 lbs prior to pregnancy.  Patient lives with her husband and 2 children (2 and 38 yo).  Her children are healthy.  She is working from home currently and works for the [employer].  Her insurance with [employer] (laid off in February) runs out 4/15 and her new [employer] starts May 1.  Preferred Learning Style:   No preference indicated   Learning Readiness:   Ready  Change in progress  MEDICATIONS: prenatal vitamin, protonix   DIETARY INTAKE: Usual eating pattern includes 3 meals and 1 snacks per day. 24-hr recall:  B ( AM): 2 eggs, frozen waffle with syrup , chai tea with 1 T sugar  Snk ( AM): none  L ( PM): chicken, rice OR sandwich OR pizza OR other leftovers WITH fruit Snk ( PM): none D ( PM): chicken, rice OR rice and chili and sometimes vegetables AND fruit  Snk ( PM): orange or other fruit Beverages: chai tea with 1 T sugar, water, regular coke (until 2 weeks ago), occasional arnold palmer  Usual physical activity: none except for occasional walk  Progress Towards Goal(s):  In progress.   Nutritional Diagnosis:  NB-1.1 Food and nutrition-related knowledge deficit As related to balance of carbohydrate, protein, and fat.  As evidenced by diet hx and patient report.    Intervention:  Nutrition education related to GDM.  Discussed causes, treatment, benefits of exercise, carbohydrate counting, spreading carbohydrates throughout the day.  Discussed the role of stress and stress control as well.  Will discuss blood sugar monitoring and BG goals more with patient this afternoon or tomorrow after she gets her BG meter from the  pharmacy.  Addendum:  Called patient tonight and took her through the process of how to use the BG monitor (Contour).  She was able to complete this.  Her BG was 99.  She has not yet eaten dinner.  Reviewed BG goals with patient and  BG monitoring frequency. Plan:  Aim for 3-4 Carb Choices per meal (45-60 grams) +/- 1 either way  Aim for 0-1 Carbs per snack if hungry  Include protein in moderation with your meals and snacks Consider reading food labels for Total Carbohydrate of foods Consider  increasing your activity level by walking or pregnancy videos as allowed by your MD.  Check your blood sugar 4 times daily  Before breakfast  2 hours after the start of breakfast, lunch, dinner Blood glucose goals  Before breakfast = 60-95  2 hours after starting a meal = Less than 120  Teaching Method Utilized:  Auditory  Handouts given during visit include:  E-mailed Nutrition Diabetes and Pregnancy  Barriers to learning/adherence to lifestyle change: none  Demonstrated degree of understanding via:  Teach Back   Monitoring/Evaluation:  Dietary intake, exercise, label reading, and body weight prn.

## 2019-04-07 NOTE — Patient Instructions (Signed)
Plan:  Aim for 3-4 Carb Choices per meal (45-60 grams) +/- 1 either way  Aim for 0-1 Carbs per snack if hungry  Include protein in moderation with your meals and snacks Consider reading food labels for Total Carbohydrate of foods Consider  increasing your activity level by walking or pregnancy videos as allowed by your MD.  Check your blood sugar 4 times daily  Before breakfast  2 hours after the start of breakfast, lunch, dinner Blood glucose goals  Before breakfast = 60-95  2 hours after starting a meal = Less than 120

## 2019-04-07 NOTE — Telephone Encounter (Signed)
Documentation for phone consultation in epic note

## 2019-04-07 NOTE — Telephone Encounter (Signed)
Called patient and took her through the process of BG monitoring.  She was able to do this without a problem.  BG 99 before dinner.   Discussed goals, frequency and needle disposal. She is to call for any questions.  Oran Rein, RD, LDN, CDE

## 2019-04-29 DIAGNOSIS — Q270 Congenital absence and hypoplasia of umbilical artery: Secondary | ICD-10-CM | POA: Diagnosis not present

## 2019-04-29 DIAGNOSIS — O43123 Velamentous insertion of umbilical cord, third trimester: Secondary | ICD-10-CM | POA: Diagnosis not present

## 2019-04-29 DIAGNOSIS — O24414 Gestational diabetes mellitus in pregnancy, insulin controlled: Secondary | ICD-10-CM | POA: Diagnosis not present

## 2019-05-06 ENCOUNTER — Other Ambulatory Visit: Payer: Self-pay | Admitting: Obstetrics and Gynecology

## 2019-05-06 DIAGNOSIS — O24414 Gestational diabetes mellitus in pregnancy, insulin controlled: Secondary | ICD-10-CM | POA: Diagnosis not present

## 2019-05-06 DIAGNOSIS — Z3A33 33 weeks gestation of pregnancy: Secondary | ICD-10-CM | POA: Diagnosis not present

## 2019-05-12 DIAGNOSIS — Z3A34 34 weeks gestation of pregnancy: Secondary | ICD-10-CM | POA: Diagnosis not present

## 2019-05-12 DIAGNOSIS — O43123 Velamentous insertion of umbilical cord, third trimester: Secondary | ICD-10-CM | POA: Diagnosis not present

## 2019-05-12 DIAGNOSIS — Z3483 Encounter for supervision of other normal pregnancy, third trimester: Secondary | ICD-10-CM | POA: Diagnosis not present

## 2019-05-12 DIAGNOSIS — O24414 Gestational diabetes mellitus in pregnancy, insulin controlled: Secondary | ICD-10-CM | POA: Diagnosis not present

## 2019-05-19 DIAGNOSIS — Z3A35 35 weeks gestation of pregnancy: Secondary | ICD-10-CM | POA: Diagnosis not present

## 2019-05-19 DIAGNOSIS — Z369 Encounter for antenatal screening, unspecified: Secondary | ICD-10-CM | POA: Diagnosis not present

## 2019-05-19 DIAGNOSIS — O24414 Gestational diabetes mellitus in pregnancy, insulin controlled: Secondary | ICD-10-CM | POA: Diagnosis not present

## 2019-05-19 DIAGNOSIS — Z3483 Encounter for supervision of other normal pregnancy, third trimester: Secondary | ICD-10-CM | POA: Diagnosis not present

## 2019-05-22 LAB — OB RESULTS CONSOLE GBS: GBS: NEGATIVE

## 2019-05-27 DIAGNOSIS — Z8759 Personal history of other complications of pregnancy, childbirth and the puerperium: Secondary | ICD-10-CM | POA: Diagnosis not present

## 2019-05-27 DIAGNOSIS — O24414 Gestational diabetes mellitus in pregnancy, insulin controlled: Secondary | ICD-10-CM | POA: Diagnosis not present

## 2019-05-27 DIAGNOSIS — Z3A36 36 weeks gestation of pregnancy: Secondary | ICD-10-CM | POA: Diagnosis not present

## 2019-06-02 DIAGNOSIS — Z8719 Personal history of other diseases of the digestive system: Secondary | ICD-10-CM

## 2019-06-02 DIAGNOSIS — O3110X0 Continuing pregnancy after spontaneous abortion of one fetus or more, unspecified trimester, not applicable or unspecified: Secondary | ICD-10-CM | POA: Diagnosis not present

## 2019-06-02 DIAGNOSIS — Q270 Congenital absence and hypoplasia of umbilical artery: Secondary | ICD-10-CM

## 2019-06-02 DIAGNOSIS — Z8759 Personal history of other complications of pregnancy, childbirth and the puerperium: Secondary | ICD-10-CM

## 2019-06-02 DIAGNOSIS — O24419 Gestational diabetes mellitus in pregnancy, unspecified control: Secondary | ICD-10-CM | POA: Diagnosis present

## 2019-06-02 DIAGNOSIS — N979 Female infertility, unspecified: Secondary | ICD-10-CM | POA: Diagnosis not present

## 2019-06-02 NOTE — H&P (Signed)
Holly Jackson is a 38 y.o. female, G3P2002 at 5539 6/7 weeks, presenting for scheduled repeat C/S (#3).  She denies leaking, bleeding, HA, visual sx, or any other issues.  She reports good FM.  She is gestational diabetic, on NPH 14 u HS and Metformin 500 mg po prior to the evening meal.  She reports positive FM, denies leaking/bleeding or contractions.  Patient Active Problem List   Diagnosis Date Noted  . Single umbilical artery 06/02/2019  . Gestational diabetes 06/02/2019  . Infertility, female 06/02/2019  . History of cholestasis during pregnancy--2015 pregnancy 06/02/2019  . 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 Mild persistent asthma 05/23/2017  . Atelectasis of left lung 03/07/2017  . Allergy to penicillin - causes SOB; mild resp distress 03/01/2017  . Allergy to sulfa drugs - mod-severe rash 03/01/2017  . History of depression 03/01/2017  . AMA (advanced maternal age) multigravida 35+, third trimester 03/01/2017  . Velamentous insertion of umbilical cord 03/01/2017  . Previous cesarean delivery, antepartum condition or complication 09/22/2014  . Generalized anxiety disorder 09/17/2014  . Hx of abdominoplasty 09/17/2014    History of present pregnancy: Patient entered care at 10 weeks.   EDC of 06/19/19 was established by embryo transfer date.   Pregnancy was result of IVF, with initially 2 IUGS seen at 6 weeks, but 2nd sac was confirmed empty at 9 weeks. US at 10 weeks--2nd gestational sac confirmed empty, with ? Echogenic mass in cervical canal, cervix 3.9, appeared dynamic, possible collapsing CLC on right. US at 14 6/7 weeks--cervix closed, no cervical mass seen. Anatomy scan: 19 6/7 weeks, with limited anatomy, suspected velamentous insertion, single umbilical artery, and an anterior placenta.  Cervix closed/4 cm long Additional US evaluations:   22 6/7 weeks--anatomy complete, cervix closed/long, velamentous  insertion confirmed, normal growth. 27 4/7 weeks--Normal growth, transverse, EFW 30%ile, normal fluid. 32 5/7 weeks--Vtx, EFW 4+10, 50%ile, normal fluid; Weekly BPPs from 32 weeks due to GDM on medication, all WNL 36 5/7 weeks--Vtx, EFW 6+7, 47%ile, normal fluid.   Significant prenatal events:  Normal pre-implantation genetic testing.  Pregnancy result of IVF, with 2nd empty gestational sac noted on early US.  Continued on Levothyroxine during pregnancy from use during fertility work, normal TSH during pregnancy.  Dx with GDM at 28 weeks.  Started on Metformin at 31 weeks, then added insulin at 32 weeks.  Insulin was recommended at 31 weeks, but cost was prohibitive at that time.  Followed with US q 4 weeks due to AMA, GDM on Metformin and insulin, weekly NSTs from 36 weeks due to velamentous insertion.  Had normal bile acids at 22 weeks, done due to itching and hx cholestasis previous pregnancy.  Desired repeat C/S with SR and VL.  Hyperpigmented skin lesion noted on dorsum of right foot at 32 weeks, treated with course of triamcinolone with benefit, referred to dermatologist.  CBGs stabilized by 33 weeks on NPH 14 U at HS and Metformin 500 mg before evening meal, and remained on this regimen for the remainder of the pregnancy.  Bile acids repeated at 36 6/7 weeks due to itching, all WNL.  Mild iron deficiency noted on iron studies.  GBS negative at 36 weeks.  Last evaluation: 06/15/19--BP 118/82, weight 199, reactive NST, CBGs WNL.  OB History    Gravida  3   Para  2   Term  2   Preterm      AB  Living  2     SAB      TAB      Ectopic      Multiple  0   Live Births  2         2015--Primary LTCS, 37 1/7 weeks, female, 6+4, due to NRFHR, induced due to cholestasis, with [facility], Dr. Sallye OberKulwa 2018--Repeat LTCS, 40 weeks, female, 7+7, repeat with Dr. Estanislado Pandyivard, velamentous insertion.  Past Medical History:  Diagnosis Date  . Anxiety   . Asthma   . Cholestasis of pregnancy   .  Depression   . Gestational diabetes   . IBS (irritable bowel syndrome)   . Newborn product of in vitro fertilization (IVF) pregnancy   . Vaginal Pap smear, abnormal    pos HPV  . Velamentous insertion of umbilical cord    Past Surgical History:  Procedure Laterality Date  . ABDOMINOPLASTY    . CESAREAN SECTION N/A 09/21/2014   Procedure: CESAREAN SECTION;  Surgeon: Konrad FelixEma Wakuru Kulwa, MD;  Location: [facility];  Service: Obstetrics;  Laterality: N/A;  . CESAREAN SECTION N/A 03/04/2017   Procedure: CESAREAN SECTION;  Surgeon: Silverio LaySandra Rivard, MD;  [facility];  Service: Obstetrics;  Laterality: N/A;   Family History: family history includes Cerebral aneurysm in her father; Miscarriages / Stillbirths in her sister; Stroke in her father and mother.   Social History:  reports that she quit smoking about 13 years ago. She has never used smokeless tobacco. She reports that she does not drink alcohol or use drugs.  Patient is African American, post-grad education, married, employed as Psychiatristcompliance officer.   Prenatal Transfer Tool  Maternal Diabetes: Yes:  Diabetes Type:  Insulin/Medication controlled Genetic Screening: Normal NIPS on embryos during fertility w/u, negative SMA/Fragile X/CF testing of patient prior to pregnancy. Maternal Ultrasounds/Referrals: Abnormal:  Findings:   Other:  Single umbilical artery, velamentous insertion Fetal Ultrasounds or other Referrals:  None Maternal Substance Abuse:  No Significant Maternal Medications:  Meds include: Other: Insulin and metformin, levothyroxine Significant Maternal Lab Results: Lab values include: Group B Strep negative  TDAP 03/24/19 Flu 10/24/18  ROS:  Reports frequent FM, denies leaking, bleeding, contractions, HA, visual sx, or epigastric pain.  Allergies  Allergen Reactions  . Penicillins Shortness Of Breath    Has patient had a PCN reaction causing immediate rash, facial/tongue/throat swelling, SOB or lightheadedness with  hypotension: Yes Has patient had a PCN reaction causing severe rash involving mucus membranes or skin necrosis: No Has patient had a PCN reaction that required hospitalization No Has patient had a PCN reaction occurring within the last 10 years: No If all of the above answers are "NO", then may proceed with Cephalosporin use.    . Sulfa Antibiotics Rash  . Sulfasalazine Rash       Last menstrual period 07/15/2018, currently breastfeeding.  Chest clear Heart RRR without murmur Abd gravid, NT, FH 39 cm Pelvic: Deferred Ext: WNL  FHR: 150 at last visit UCs: Occasional  Prenatal labs: ABO, Rh: O/Positive/-- (12/02 0000) Antibody: Negative (12/02 0000) Rubella:  Immune (12/02 0000) RPR: Nonreactive (12/02 0000)  HBsAg: Negative (12/02 0000)  HIV: Non-reactive (12/02 0000)  GBS: Negative (05/29 0000) Sickle cell/Hgb electrophoresis:  AA Pap:  2017, WNL GC: Negative 12/26/18 Chlamydia:  Negative 12/26/18 Genetic screenings:  Normal in pre-implantation w/u, declined additional testing Glucola:  Elevated at 154, abnormal 3 hour GTT Other:   Hgb 12 at NOB, 12.2 at 28 weeks Normal bile acids/CMP x 2 during pregnancy.  Assessment/Plan: IUP at 39 6/7 weeks Prior C/S x 2, desires repeat Gestational diabetes, on insulin and Metformin IVF pregnancy AMA Anxiety Allergy to penicillin Hx abdominoplasty Velamentous insertion Single umbilical artery Pregnancy initially with 2 gestational sacs, one empty at 9 weeks.  [facility]: [facility] consult with Dr. Cletis Media. Routine [facility] pre-op orders  Donnel Saxon CNM, [place] 06/18/2019, 6:03 AM

## 2019-06-04 DIAGNOSIS — Z3A37 37 weeks gestation of pregnancy: Secondary | ICD-10-CM | POA: Diagnosis not present

## 2019-06-04 DIAGNOSIS — O43123 Velamentous insertion of umbilical cord, third trimester: Secondary | ICD-10-CM | POA: Diagnosis not present

## 2019-06-04 DIAGNOSIS — O24414 Gestational diabetes mellitus in pregnancy, insulin controlled: Secondary | ICD-10-CM | POA: Diagnosis not present

## 2019-06-08 ENCOUNTER — Telehealth (HOSPITAL_COMMUNITY): Payer: Self-pay | Admitting: *Deleted

## 2019-06-08 NOTE — Telephone Encounter (Signed)
Preadmission screen  

## 2019-06-09 ENCOUNTER — Encounter (HOSPITAL_COMMUNITY): Payer: Self-pay

## 2019-06-09 ENCOUNTER — Telehealth (HOSPITAL_COMMUNITY): Payer: Self-pay | Admitting: *Deleted

## 2019-06-09 DIAGNOSIS — O24414 Gestational diabetes mellitus in pregnancy, insulin controlled: Secondary | ICD-10-CM | POA: Diagnosis not present

## 2019-06-09 DIAGNOSIS — Z3A38 38 weeks gestation of pregnancy: Secondary | ICD-10-CM | POA: Diagnosis not present

## 2019-06-09 NOTE — Telephone Encounter (Signed)
Preadmission screen  

## 2019-06-09 NOTE — Patient Instructions (Signed)
Holly Jackson  06/09/2019   Your procedure is scheduled on:  06/18/2019  Arrive at 39 at Entrance C on [address] at [facility]  and [address]. You are invited to use the FREE valet parking or use the Visitor's parking deck.  Pick up the phone at the desk and dial [phone].  Call this number if you have problems the morning of surgery: [phone]  Remember:   Do not eat food:(After Midnight) Desps de medianoche.  Do not drink clear liquids: (After Midnight) Desps de medianoche.  Take these medicines the morning of surgery with A SIP OF WATER:  Do not take metformin the night before surgery.  Take 7 units of Novalin NPH insulin the night before surgery.  NO INSULIN THE DAY OF SURGERY   Do not wear jewelry, make-up or nail polish.  Do not wear lotions, powders, or perfumes. Do not wear deodorant.  Do not shave 48 hours prior to surgery.  Do not bring valuables to the hospital.  [facility] is not   responsible for any belongings or valuables brought to the hospital.  Contacts, dentures or bridgework may not be worn into surgery.  Leave suitcase in the car. After surgery it may be brought to your room.  For patients admitted to the hospital, checkout time is 11:00 AM the day of              discharge.      Please read over the following fact sheets that you were given:     Preparing for Surgery

## 2019-06-15 DIAGNOSIS — Z3A39 39 weeks gestation of pregnancy: Secondary | ICD-10-CM | POA: Diagnosis not present

## 2019-06-15 DIAGNOSIS — O24414 Gestational diabetes mellitus in pregnancy, insulin controlled: Secondary | ICD-10-CM | POA: Diagnosis not present

## 2019-06-15 DIAGNOSIS — O43123 Velamentous insertion of umbilical cord, third trimester: Secondary | ICD-10-CM | POA: Diagnosis not present

## 2019-06-16 ENCOUNTER — Other Ambulatory Visit (HOSPITAL_COMMUNITY)
Admission: RE | Admit: 2019-06-16 | Discharge: 2019-06-16 | Disposition: A | Discharge status: Home / Self Care | Payer: BC Managed Care – PPO | Source: Ambulatory Visit | Attending: Obstetrics and Gynecology | Admitting: Obstetrics and Gynecology

## 2019-06-16 ENCOUNTER — Other Ambulatory Visit: Payer: Self-pay

## 2019-06-16 DIAGNOSIS — Z1159 Encounter for screening for other viral diseases: Secondary | ICD-10-CM | POA: Insufficient documentation

## 2019-06-16 HISTORY — DX: Gestational diabetes mellitus in pregnancy, unspecified control: O24.419

## 2019-06-16 LAB — SARS CORONAVIRUS 2 (TAT 6-24 HRS): SARS Coronavirus 2: NEGATIVE

## 2019-06-16 NOTE — MAU Note (Signed)
Asymptomatic, swab collected without problem. 

## 2019-06-18 ENCOUNTER — Inpatient Hospital Stay (HOSPITAL_COMMUNITY): Payer: BC Managed Care – PPO | Admitting: Anesthesiology

## 2019-06-18 ENCOUNTER — Other Ambulatory Visit: Payer: Self-pay

## 2019-06-18 ENCOUNTER — Inpatient Hospital Stay (HOSPITAL_COMMUNITY)
Admission: RE | Admit: 2019-06-18 | Discharge: 2019-06-21 | DRG: 788 | Disposition: A | Discharge status: Home / Self Care | Payer: BC Managed Care – PPO | Attending: Obstetrics & Gynecology | Admitting: Obstetrics & Gynecology

## 2019-06-18 ENCOUNTER — Encounter (HOSPITAL_COMMUNITY)
Admission: RE | Disposition: A | Discharge status: Home / Self Care | Payer: Self-pay | Source: Home / Self Care | Attending: Obstetrics & Gynecology

## 2019-06-18 ENCOUNTER — Encounter (HOSPITAL_COMMUNITY): Payer: Self-pay | Admitting: *Deleted

## 2019-06-18 DIAGNOSIS — Z412 Encounter for routine and ritual male circumcision: Secondary | ICD-10-CM | POA: Diagnosis not present

## 2019-06-18 DIAGNOSIS — O34219 Maternal care for unspecified type scar from previous cesarean delivery: Secondary | ICD-10-CM | POA: Diagnosis not present

## 2019-06-18 DIAGNOSIS — Z880 Allergy status to penicillin: Secondary | ICD-10-CM

## 2019-06-18 DIAGNOSIS — O34211 Maternal care for low transverse scar from previous cesarean delivery: Secondary | ICD-10-CM | POA: Diagnosis not present

## 2019-06-18 DIAGNOSIS — Z9889 Other specified postprocedural states: Secondary | ICD-10-CM

## 2019-06-18 DIAGNOSIS — K760 Fatty (change of) liver, not elsewhere classified: Secondary | ICD-10-CM | POA: Diagnosis not present

## 2019-06-18 DIAGNOSIS — O43129 Velamentous insertion of umbilical cord, unspecified trimester: Secondary | ICD-10-CM | POA: Diagnosis present

## 2019-06-18 DIAGNOSIS — Z8719 Personal history of other diseases of the digestive system: Secondary | ICD-10-CM

## 2019-06-18 DIAGNOSIS — E669 Obesity, unspecified: Secondary | ICD-10-CM | POA: Diagnosis not present

## 2019-06-18 DIAGNOSIS — R030 Elevated blood-pressure reading, without diagnosis of hypertension: Secondary | ICD-10-CM | POA: Diagnosis not present

## 2019-06-18 DIAGNOSIS — Z23 Encounter for immunization: Secondary | ICD-10-CM | POA: Diagnosis not present

## 2019-06-18 DIAGNOSIS — Z1159 Encounter for screening for other viral diseases: Secondary | ICD-10-CM | POA: Diagnosis not present

## 2019-06-18 DIAGNOSIS — Z87891 Personal history of nicotine dependence: Secondary | ICD-10-CM

## 2019-06-18 DIAGNOSIS — O09523 Supervision of elderly multigravida, third trimester: Secondary | ICD-10-CM | POA: Diagnosis present

## 2019-06-18 DIAGNOSIS — Q825 Congenital non-neoplastic nevus: Secondary | ICD-10-CM | POA: Diagnosis not present

## 2019-06-18 DIAGNOSIS — F411 Generalized anxiety disorder: Secondary | ICD-10-CM | POA: Diagnosis present

## 2019-06-18 DIAGNOSIS — Z3A39 39 weeks gestation of pregnancy: Secondary | ICD-10-CM

## 2019-06-18 DIAGNOSIS — O24424 Gestational diabetes mellitus in childbirth, insulin controlled: Secondary | ICD-10-CM | POA: Diagnosis present

## 2019-06-18 DIAGNOSIS — R911 Solitary pulmonary nodule: Secondary | ICD-10-CM | POA: Diagnosis present

## 2019-06-18 DIAGNOSIS — O99214 Obesity complicating childbirth: Secondary | ICD-10-CM | POA: Diagnosis not present

## 2019-06-18 DIAGNOSIS — G8918 Other acute postprocedural pain: Secondary | ICD-10-CM | POA: Diagnosis not present

## 2019-06-18 DIAGNOSIS — Q270 Congenital absence and hypoplasia of umbilical artery: Secondary | ICD-10-CM

## 2019-06-18 DIAGNOSIS — O26893 Other specified pregnancy related conditions, third trimester: Secondary | ICD-10-CM | POA: Diagnosis present

## 2019-06-18 DIAGNOSIS — O24419 Gestational diabetes mellitus in pregnancy, unspecified control: Secondary | ICD-10-CM | POA: Diagnosis present

## 2019-06-18 DIAGNOSIS — O43123 Velamentous insertion of umbilical cord, third trimester: Secondary | ICD-10-CM | POA: Diagnosis present

## 2019-06-18 DIAGNOSIS — O9952 Diseases of the respiratory system complicating childbirth: Secondary | ICD-10-CM | POA: Diagnosis present

## 2019-06-18 DIAGNOSIS — J4530 Mild persistent asthma, uncomplicated: Secondary | ICD-10-CM | POA: Diagnosis present

## 2019-06-18 DIAGNOSIS — Z3A00 Weeks of gestation of pregnancy not specified: Secondary | ICD-10-CM | POA: Diagnosis not present

## 2019-06-18 DIAGNOSIS — O3110X0 Continuing pregnancy after spontaneous abortion of one fetus or more, unspecified trimester, not applicable or unspecified: Secondary | ICD-10-CM | POA: Diagnosis not present

## 2019-06-18 DIAGNOSIS — N979 Female infertility, unspecified: Secondary | ICD-10-CM | POA: Diagnosis not present

## 2019-06-18 DIAGNOSIS — Z882 Allergy status to sulfonamides status: Secondary | ICD-10-CM | POA: Diagnosis not present

## 2019-06-18 DIAGNOSIS — Z8659 Personal history of other mental and behavioral disorders: Secondary | ICD-10-CM

## 2019-06-18 LAB — ABO/RH: ABO/RH(D): O POS

## 2019-06-18 LAB — CBC
HCT: 43.7 % (ref 36.0–46.0)
HCT: 40.8 % (ref 36.0–46.0)
Hemoglobin: 13.8 g/dL (ref 12.0–15.0)
Hemoglobin: 13.1 g/dL (ref 12.0–15.0)
MCH: 24.8 pg — ABNORMAL LOW (ref 26.0–34.0)
MCH: 25.2 pg — ABNORMAL LOW (ref 26.0–34.0)
MCHC: 31.6 g/dL (ref 30.0–36.0)
MCHC: 32.1 g/dL (ref 30.0–36.0)
MCV: 78.5 fL — ABNORMAL LOW (ref 80.0–100.0)
MCV: 78.6 fL — ABNORMAL LOW (ref 80.0–100.0)
Platelets: 270 10*3/uL (ref 150–400)
Platelets: 224 10*3/uL (ref 150–400)
RBC: 5.57 MIL/uL — ABNORMAL HIGH (ref 3.87–5.11)
RBC: 5.19 MIL/uL — ABNORMAL HIGH (ref 3.87–5.11)
RDW: 17.4 % — ABNORMAL HIGH (ref 11.5–15.5)
RDW: 16.7 % — ABNORMAL HIGH (ref 11.5–15.5)
WBC: 8.8 10*3/uL (ref 4.0–10.5)
WBC: 11.6 10*3/uL — ABNORMAL HIGH (ref 4.0–10.5)
nRBC: 0 % (ref 0.0–0.2)
nRBC: 0 % (ref 0.0–0.2)

## 2019-06-18 LAB — CREATININE, SERUM
Creatinine, Ser: 0.55 mg/dL (ref 0.44–1.00)
GFR calc Af Amer: >60 mL/min (ref >60)
GFR calc non Af Amer: >60 mL/min (ref >60)

## 2019-06-18 LAB — COMPREHENSIVE METABOLIC PANEL
ALT: 21 U/L (ref 0–44)
AST: 22 U/L (ref 15–41)
Albumin: 3.2 g/dL — ABNORMAL LOW (ref 3.5–5.0)
Alkaline Phosphatase: 128 U/L — ABNORMAL HIGH (ref 38–126)
Anion gap: 12 (ref 5–15)
BUN: 9 mg/dL (ref 6–20)
CO2: 18 mmol/L — ABNORMAL LOW (ref 22–32)
Calcium: 9.4 mg/dL (ref 8.9–10.3)
Chloride: 106 mmol/L (ref 98–111)
Creatinine, Ser: 0.57 mg/dL (ref 0.44–1.00)
GFR calc Af Amer: >60 mL/min (ref >60)
GFR calc non Af Amer: >60 mL/min (ref >60)
Glucose, Bld: 96 mg/dL (ref 70–99)
Potassium: 4.2 mmol/L (ref 3.5–5.1)
Sodium: 136 mmol/L (ref 135–145)
Total Bilirubin: 0.3 mg/dL (ref 0.3–1.2)
Total Protein: 6.5 g/dL (ref 6.5–8.1)

## 2019-06-18 LAB — TYPE AND SCREEN
ABO/RH(D): O POS
Antibody Screen: NEGATIVE

## 2019-06-18 LAB — GLUCOSE, CAPILLARY
Glucose-Capillary: 93 mg/dL (ref 70–99)
Glucose-Capillary: 98 mg/dL (ref 70–99)
Glucose-Capillary: 140 mg/dL — ABNORMAL HIGH (ref 70–99)

## 2019-06-18 LAB — PROTEIN / CREATININE RATIO, URINE
Creatinine, Urine: 47.34 mg/dL
Total Protein, Urine: <6 mg/dL

## 2019-06-18 LAB — URIC ACID: Uric Acid, Serum: 6.1 mg/dL (ref 2.5–7.1)

## 2019-06-18 LAB — LACTATE DEHYDROGENASE: LDH: 139 U/L (ref 98–192)

## 2019-06-18 SURGERY — Surgical Case
Anesthesia: Spinal | Wound class: Clean Contaminated

## 2019-06-18 MED ORDER — NALOXONE HCL 4 MG/10ML IJ SOLN
1.0000 ug/kg/h | INTRAVENOUS | Status: DC | PRN
  Filled 2019-06-18: qty 5

## 2019-06-18 MED ORDER — NALBUPHINE HCL 10 MG/ML IJ SOLN
INTRAMUSCULAR | Status: AC
  Filled 2019-06-18: qty 1

## 2019-06-18 MED ORDER — LACTATED RINGERS IV SOLN
INTRAVENOUS | Status: DC
  Administered 2019-06-18 (×2): via INTRAVENOUS

## 2019-06-18 MED ORDER — DIPHENHYDRAMINE HCL 25 MG PO CAPS
25.0000 mg | ORAL_CAPSULE | ORAL | Status: DC | PRN
  Administered 2019-06-19: 25 mg via ORAL
  Filled 2019-06-18: qty 1

## 2019-06-18 MED ORDER — SENNOSIDES-DOCUSATE SODIUM 8.6-50 MG PO TABS
2.0000 | ORAL_TABLET | ORAL | Status: DC
  Administered 2019-06-19 – 2019-06-20 (×3): 2 via ORAL
  Filled 2019-06-18 (×3): qty 2

## 2019-06-18 MED ORDER — BUPIVACAINE HCL (PF) 0.25 % IJ SOLN
INTRAMUSCULAR | Status: DC | PRN
  Administered 2019-06-18: 20 mL

## 2019-06-18 MED ORDER — FENTANYL CITRATE (PF) 100 MCG/2ML IJ SOLN
25.0000 ug | INTRAMUSCULAR | Status: DC | PRN
  Administered 2019-06-18: 50 ug via INTRAVENOUS

## 2019-06-18 MED ORDER — SIMETHICONE 80 MG PO CHEW
80.0000 mg | CHEWABLE_TABLET | ORAL | Status: DC
  Administered 2019-06-19 – 2019-06-20 (×3): 80 mg via ORAL
  Filled 2019-06-18 (×3): qty 1

## 2019-06-18 MED ORDER — DIPHENHYDRAMINE HCL 50 MG/ML IJ SOLN
12.5000 mg | INTRAMUSCULAR | Status: DC | PRN
  Administered 2019-06-18: 12.5 mg via INTRAVENOUS

## 2019-06-18 MED ORDER — LACTATED RINGERS IV SOLN
INTRAVENOUS | Status: DC
  Administered 2019-06-18: 16:00:00 via INTRAVENOUS

## 2019-06-18 MED ORDER — FERROUS SULFATE 325 (65 FE) MG PO TABS
325.0000 mg | ORAL_TABLET | Freq: Two times a day (BID) | ORAL | Status: DC
  Administered 2019-06-19 – 2019-06-21 (×5): 325 mg via ORAL
  Filled 2019-06-18 (×6): qty 1

## 2019-06-18 MED ORDER — MORPHINE SULFATE (PF) 0.5 MG/ML IJ SOLN
INTRAMUSCULAR | Status: DC | PRN
  Administered 2019-06-18: .15 mg via INTRATHECAL

## 2019-06-18 MED ORDER — PRENATAL MULTIVITAMIN CH
1.0000 | ORAL_TABLET | Freq: Every day | ORAL | Status: DC
  Administered 2019-06-19 – 2019-06-21 (×3): 1 via ORAL
  Filled 2019-06-18 (×3): qty 1

## 2019-06-18 MED ORDER — ACETAMINOPHEN 10 MG/ML IV SOLN
1000.0000 mg | Freq: Once | INTRAVENOUS | Status: DC | PRN
  Administered 2019-06-18: 1000 mg via INTRAVENOUS

## 2019-06-18 MED ORDER — OXYTOCIN 40 UNITS IN NORMAL SALINE INFUSION - SIMPLE MED: 2.5000 [IU]/h | INTRAVENOUS | Status: AC

## 2019-06-18 MED ORDER — SODIUM CHLORIDE 0.9 % IV SOLN
INTRAVENOUS | Status: DC | PRN
  Administered 2019-06-18: 40 [IU] via INTRAVENOUS

## 2019-06-18 MED ORDER — NALBUPHINE HCL 10 MG/ML IJ SOLN: 5.0000 mg | Freq: Once | INTRAMUSCULAR | Status: AC | PRN

## 2019-06-18 MED ORDER — NALOXONE HCL 0.4 MG/ML IJ SOLN: 0.4000 mg | INTRAMUSCULAR | Status: DC | PRN

## 2019-06-18 MED ORDER — DIPHENHYDRAMINE HCL 25 MG PO CAPS: 25.0000 mg | ORAL_CAPSULE | Freq: Four times a day (QID) | ORAL | Status: DC | PRN

## 2019-06-18 MED ORDER — MEASLES, MUMPS & RUBELLA VAC IJ SOLR
0.5000 mL | Freq: Once | INTRAMUSCULAR | Status: DC
  Filled 2019-06-18: qty 0.5

## 2019-06-18 MED ORDER — SODIUM CHLORIDE 0.9 % IV SOLN
INTRAVENOUS | Status: DC | PRN
  Administered 2019-06-18: 11:00:00 via INTRAVENOUS

## 2019-06-18 MED ORDER — OXYTOCIN 40 UNITS IN NORMAL SALINE INFUSION - SIMPLE MED
INTRAVENOUS | Status: AC
  Filled 2019-06-18: qty 1000

## 2019-06-18 MED ORDER — FENTANYL CITRATE (PF) 100 MCG/2ML IJ SOLN
INTRAMUSCULAR | Status: AC
  Filled 2019-06-18: qty 2

## 2019-06-18 MED ORDER — FENTANYL CITRATE (PF) 100 MCG/2ML IJ SOLN
50.0000 ug | Freq: Once | INTRAMUSCULAR | Status: AC
  Administered 2019-06-18: 50 ug via INTRAVENOUS
  Filled 2019-06-18: qty 2

## 2019-06-18 MED ORDER — NALBUPHINE HCL 10 MG/ML IJ SOLN: 5.0000 mg | INTRAMUSCULAR | Status: DC | PRN

## 2019-06-18 MED ORDER — CLINDAMYCIN PHOSPHATE 900 MG/50ML IV SOLN
900.0000 mg | INTRAVENOUS | Status: AC
  Administered 2019-06-18: 900 mg via INTRAVENOUS

## 2019-06-18 MED ORDER — PHENYLEPHRINE 40 MCG/ML (10ML) SYRINGE FOR IV PUSH (FOR BLOOD PRESSURE SUPPORT)
PREFILLED_SYRINGE | INTRAVENOUS | Status: DC | PRN
  Administered 2019-06-18: 40 ug via INTRAVENOUS

## 2019-06-18 MED ORDER — DIPHENHYDRAMINE HCL 50 MG/ML IJ SOLN
INTRAMUSCULAR | Status: AC
  Filled 2019-06-18: qty 1

## 2019-06-18 MED ORDER — DIBUCAINE (PERIANAL) 1 % EX OINT: 1.0000 "application " | TOPICAL_OINTMENT | CUTANEOUS | Status: DC | PRN

## 2019-06-18 MED ORDER — BUPIVACAINE IN DEXTROSE 0.75-8.25 % IT SOLN
INTRATHECAL | Status: DC | PRN
  Administered 2019-06-18: 1.8 mL via INTRATHECAL

## 2019-06-18 MED ORDER — COCONUT OIL OIL
1.0000 "application " | TOPICAL_OIL | Status: DC | PRN
  Administered 2019-06-21: 1 via TOPICAL

## 2019-06-18 MED ORDER — BUPIVACAINE HCL (PF) 0.25 % IJ SOLN
INTRAMUSCULAR | Status: AC
  Filled 2019-06-18: qty 20

## 2019-06-18 MED ORDER — SODIUM CHLORIDE 0.9 % IV SOLN
INTRAVENOUS | Status: DC | PRN
  Administered 2019-06-18: 60 ug/min via INTRAVENOUS

## 2019-06-18 MED ORDER — ENOXAPARIN SODIUM 40 MG/0.4ML ~~LOC~~ SOLN
40.0000 mg | SUBCUTANEOUS | Status: DC
  Administered 2019-06-19 – 2019-06-21 (×3): 40 mg via SUBCUTANEOUS
  Filled 2019-06-18 (×3): qty 0.4

## 2019-06-18 MED ORDER — MENTHOL 3 MG MT LOZG: 1.0000 | LOZENGE | OROMUCOSAL | Status: DC | PRN

## 2019-06-18 MED ORDER — WITCH HAZEL-GLYCERIN EX PADS: 1.0000 "application " | MEDICATED_PAD | CUTANEOUS | Status: DC | PRN

## 2019-06-18 MED ORDER — NALBUPHINE HCL 10 MG/ML IJ SOLN
5.0000 mg | Freq: Once | INTRAMUSCULAR | Status: AC | PRN
  Administered 2019-06-18: 5 mg via INTRAVENOUS

## 2019-06-18 MED ORDER — SCOPOLAMINE 1 MG/3DAYS TD PT72
MEDICATED_PATCH | TRANSDERMAL | Status: AC
  Filled 2019-06-18: qty 1

## 2019-06-18 MED ORDER — SIMETHICONE 80 MG PO CHEW: 80.0000 mg | CHEWABLE_TABLET | ORAL | Status: DC | PRN

## 2019-06-18 MED ORDER — MORPHINE SULFATE (PF) 0.5 MG/ML IJ SOLN
INTRAMUSCULAR | Status: AC
  Filled 2019-06-18: qty 10

## 2019-06-18 MED ORDER — ONDANSETRON HCL 4 MG/2ML IJ SOLN: 4.0000 mg | Freq: Three times a day (TID) | INTRAMUSCULAR | Status: DC | PRN

## 2019-06-18 MED ORDER — METHYLERGONOVINE MALEATE 0.2 MG/ML IJ SOLN: 0.2000 mg | INTRAMUSCULAR | Status: DC | PRN

## 2019-06-18 MED ORDER — SODIUM CHLORIDE 0.9% FLUSH: 3.0000 mL | INTRAVENOUS | Status: DC | PRN

## 2019-06-18 MED ORDER — TETANUS-DIPHTH-ACELL PERTUSSIS 5-2.5-18.5 LF-MCG/0.5 IM SUSP
0.5000 mL | Freq: Once | INTRAMUSCULAR | Status: DC
  Filled 2019-06-18: qty 0.5

## 2019-06-18 MED ORDER — ACETAMINOPHEN 325 MG PO TABS
650.0000 mg | ORAL_TABLET | ORAL | Status: DC | PRN
  Administered 2019-06-19 (×3): 650 mg via ORAL
  Filled 2019-06-18 (×3): qty 2

## 2019-06-18 MED ORDER — ONDANSETRON HCL 4 MG/2ML IJ SOLN
INTRAMUSCULAR | Status: AC
  Filled 2019-06-18: qty 2

## 2019-06-18 MED ORDER — OXYCODONE HCL 5 MG PO TABS
5.0000 mg | ORAL_TABLET | ORAL | Status: DC | PRN
  Administered 2019-06-18: 10 mg via ORAL
  Administered 2019-06-19: 5 mg via ORAL
  Administered 2019-06-19 (×2): 10 mg via ORAL
  Administered 2019-06-19 – 2019-06-20 (×3): 5 mg via ORAL
  Administered 2019-06-20: 10 mg via ORAL
  Administered 2019-06-20 (×2): 5 mg via ORAL
  Administered 2019-06-21 (×3): 10 mg via ORAL
  Filled 2019-06-18 (×4): qty 1
  Filled 2019-06-18 (×2): qty 2
  Filled 2019-06-18: qty 1
  Filled 2019-06-18: qty 2
  Filled 2019-06-18: qty 1
  Filled 2019-06-18 (×2): qty 2
  Filled 2019-06-18: qty 1
  Filled 2019-06-18 (×2): qty 2

## 2019-06-18 MED ORDER — GENTAMICIN SULFATE 40 MG/ML IJ SOLN
5.0000 mg/kg | INTRAVENOUS | Status: AC
  Administered 2019-06-18: 329.2 mg via INTRAVENOUS
  Filled 2019-06-18: qty 8.25

## 2019-06-18 MED ORDER — CLINDAMYCIN PHOSPHATE 900 MG/50ML IV SOLN
INTRAVENOUS | Status: AC
  Filled 2019-06-18: qty 50

## 2019-06-18 MED ORDER — PHENYLEPHRINE 40 MCG/ML (10ML) SYRINGE FOR IV PUSH (FOR BLOOD PRESSURE SUPPORT)
PREFILLED_SYRINGE | INTRAVENOUS | Status: AC
  Filled 2019-06-18: qty 10

## 2019-06-18 MED ORDER — ONDANSETRON HCL 4 MG/2ML IJ SOLN
INTRAMUSCULAR | Status: DC | PRN
  Administered 2019-06-18: 4 mg via INTRAVENOUS

## 2019-06-18 MED ORDER — PANTOPRAZOLE SODIUM 40 MG PO TBEC
40.0000 mg | DELAYED_RELEASE_TABLET | Freq: Every day | ORAL | Status: DC
  Administered 2019-06-18 – 2019-06-20 (×3): 40 mg via ORAL
  Filled 2019-06-18 (×3): qty 1

## 2019-06-18 MED ORDER — SCOPOLAMINE 1 MG/3DAYS TD PT72
1.0000 | MEDICATED_PATCH | Freq: Once | TRANSDERMAL | Status: AC
  Administered 2019-06-18: 1.5 mg via TRANSDERMAL

## 2019-06-18 MED ORDER — FENTANYL CITRATE (PF) 100 MCG/2ML IJ SOLN
INTRAMUSCULAR | Status: DC | PRN
  Administered 2019-06-18: 15 ug via INTRATHECAL

## 2019-06-18 MED ORDER — ZOLPIDEM TARTRATE 5 MG PO TABS: 5.0000 mg | ORAL_TABLET | Freq: Every evening | ORAL | Status: DC | PRN

## 2019-06-18 MED ORDER — METHYLERGONOVINE MALEATE 0.2 MG PO TABS: 0.2000 mg | ORAL_TABLET | ORAL | Status: DC | PRN

## 2019-06-18 MED ORDER — SIMETHICONE 80 MG PO CHEW
80.0000 mg | CHEWABLE_TABLET | Freq: Three times a day (TID) | ORAL | Status: DC
  Administered 2019-06-19 – 2019-06-21 (×8): 80 mg via ORAL
  Filled 2019-06-18 (×9): qty 1

## 2019-06-18 MED ORDER — ACETAMINOPHEN 10 MG/ML IV SOLN
INTRAVENOUS | Status: AC
  Filled 2019-06-18: qty 100

## 2019-06-18 SURGICAL SUPPLY — 40 items
BENZOIN TINCTURE PRP APPL 2/3 (GAUZE/BANDAGES/DRESSINGS) ×2 IMPLANT
BOOTIES KNEE HIGH SLOAN (MISCELLANEOUS) ×4 IMPLANT
CHLORAPREP W/TINT 26ML (MISCELLANEOUS) ×2 IMPLANT
CLAMP CORD UMBIL (MISCELLANEOUS) IMPLANT
CLOSURE STERI STRIP 1/2 X4 (GAUZE/BANDAGES/DRESSINGS) ×1 IMPLANT
CLOTH BEACON ORANGE TIMEOUT ST (SAFETY) ×2 IMPLANT
DRAIN JACKSON PRT FLT 10 (DRAIN) IMPLANT
DRSG OPSITE POSTOP 4X10 (GAUZE/BANDAGES/DRESSINGS) ×2 IMPLANT
ELECT REM PT RETURN 9FT ADLT (ELECTROSURGICAL) ×2
ELECTRODE REM PT RTRN 9FT ADLT (ELECTROSURGICAL) ×1 IMPLANT
EVACUATOR SILICONE 100CC (DRAIN) IMPLANT
EXTRACTOR VACUUM M CUP 4 TUBE (SUCTIONS) IMPLANT
GLOVE BIOGEL PI IND STRL 7.0 (GLOVE) ×2 IMPLANT
GLOVE BIOGEL PI INDICATOR 7.0 (GLOVE) ×2
GLOVE ECLIPSE 6.5 STRL STRAW (GLOVE) ×2 IMPLANT
GOWN STRL REUS W/TWL LRG LVL3 (GOWN DISPOSABLE) ×4 IMPLANT
KIT ABG SYR 3ML LUER SLIP (SYRINGE) IMPLANT
NDL HYPO 25X5/8 SAFETYGLIDE (NEEDLE) IMPLANT
NEEDLE HYPO 22GX1.5 SAFETY (NEEDLE) ×2 IMPLANT
NEEDLE HYPO 25X5/8 SAFETYGLIDE (NEEDLE) IMPLANT
NS IRRIG 1000ML POUR BTL (IV SOLUTION) ×4 IMPLANT
PACK C SECTION WH (CUSTOM PROCEDURE TRAY) ×2 IMPLANT
PAD ABD 7.5X8 STRL (GAUZE/BANDAGES/DRESSINGS) ×1 IMPLANT
PAD OB MATERNITY 4.3X12.25 (PERSONAL CARE ITEMS) ×2 IMPLANT
PENCIL SMOKE EVAC W/HOLSTER (ELECTROSURGICAL) ×2 IMPLANT
RTRCTR C-SECT PINK 25CM LRG (MISCELLANEOUS) ×2 IMPLANT
SPONGE GAUZE 4X4 12PLY STER LF (GAUZE/BANDAGES/DRESSINGS) ×2 IMPLANT
STRIP CLOSURE SKIN 1/2X4 (GAUZE/BANDAGES/DRESSINGS) ×2 IMPLANT
SUT MNCRL AB 3-0 PS2 27 (SUTURE) ×2 IMPLANT
SUT PLAIN 2 0 XLH (SUTURE) ×2 IMPLANT
SUT SILK 2 0 FSL 18 (SUTURE) IMPLANT
SUT VIC AB 0 CTX 36 (SUTURE) ×2
SUT VIC AB 0 CTX36XBRD ANBCTRL (SUTURE) ×2 IMPLANT
SUT VIC AB 1 CT1 36 (SUTURE) ×4 IMPLANT
SUT VIC AB 2-0 CT1 27 (SUTURE)
SUT VIC AB 2-0 CT1 TAPERPNT 27 (SUTURE) IMPLANT
SYR 20CC LL (SYRINGE) ×2 IMPLANT
TOWEL OR 17X24 6PK STRL BLUE (TOWEL DISPOSABLE) ×2 IMPLANT
TRAY FOLEY W/BAG SLVR 14FR LF (SET/KITS/TRAYS/PACK) ×2 IMPLANT
WATER STERILE IRR 1000ML POUR (IV SOLUTION) ×2 IMPLANT

## 2019-06-18 NOTE — Transfer of Care (Signed)
Immediate Anesthesia Transfer of Care Note  Patient: Holly Jackson  Procedure(s) Performed: Repeat CESAREAN SECTION (N/A )  Patient Location: PACU  Anesthesia Type:Spinal  Level of Consciousness: awake, alert  and oriented  Airway & Oxygen Therapy: Patient Spontanous Breathing  Post-op Assessment: Report given to RN and Post -op Vital signs reviewed and stable  Post vital signs: Reviewed and stable  Last Vitals:  Vitals Value Taken Time  BP 91/56 06/18/19 1215  Temp    Pulse 89 06/18/19 1217  Resp 17 06/18/19 1217  SpO2 98 % 06/18/19 1217  Vitals shown include unvalidated device data.  Last Pain:  Vitals:   06/18/19 0851  TempSrc: Oral         Complications: No apparent anesthesia complications

## 2019-06-18 NOTE — Progress Notes (Addendum)
Here for scheduled C/S.   Notified by pre-op staff of patient's elevated BP.  Vitals:   06/18/19 0847 06/18/19 0851  BP:  (!) 139/96  Pulse:  100  Resp:  18  Temp:  97.9 F (36.6 C)  TempSrc:  Oral  SpO2:  100%  Weight: 89.8 kg   Height: 5\' 2"  (1.575 m)    Reports mild HA last several days, none today, denies visual sx or epigastric pain. Minimal edema, reflexes WNL.  Added CMP, LDH, uric acid to labs, and urine for PCR. COVID-19 negative.  Will inform Dr. Cletis Media.  Donnel Saxon, CNM

## 2019-06-18 NOTE — Anesthesia Postprocedure Evaluation (Signed)
Anesthesia Post Note  Patient: Holly Jackson  Procedure(s) Performed: Repeat CESAREAN SECTION (N/A )     Patient location during evaluation: PACU Anesthesia Type: Spinal Level of consciousness: oriented and awake and alert Pain management: pain level controlled Vital Signs Assessment: post-procedure vital signs reviewed and stable Respiratory status: spontaneous breathing, respiratory function stable and patient connected to nasal cannula oxygen Cardiovascular status: blood pressure returned to baseline and stable Postop Assessment: no headache, no backache and no apparent nausea or vomiting Anesthetic complications: no    Last Vitals:  Vitals:   06/18/19 1315 06/18/19 1330  BP: 120/69 113/69  Pulse: 84   Resp: 18 12  Temp: 36.6 C   SpO2: 100% 100%    Last Pain:  Vitals:   06/18/19 1315  TempSrc:   PainSc: 0-No pain   Pain Goal:    LLE Motor Response: Purposeful movement (06/18/19 1315)   RLE Motor Response: Purposeful movement (06/18/19 1315)       Epidural/Spinal Function Cutaneous sensation: Able to Discern Pressure (06/18/19 1330), Patient able to flex knees: Yes (06/18/19 1330), Patient able to lift hips off bed: No (06/18/19 1330), Back pain beyond tenderness at insertion site: No (06/18/19 1330), Progressively worsening motor and/or sensory loss: No (06/18/19 1330), Bowel and/or bladder incontinence post epidural: No (06/18/19 1330)  [place]

## 2019-06-18 NOTE — Anesthesia Preprocedure Evaluation (Signed)
Anesthesia Evaluation  Patient identified by MRN, date of birth, ID band Patient awake    Reviewed: Allergy & Precautions, NPO status , Patient's Chart, lab work & pertinent test results  Airway Mallampati: II  TM Distance: >3 FB Neck ROM: Full    Dental no notable dental hx. (+) Teeth Intact, Dental Advisory Given   Pulmonary asthma , former smoker,    Pulmonary exam normal breath sounds clear to auscultation       Cardiovascular negative cardio ROS Normal cardiovascular exam Rhythm:Regular Rate:Normal     Neuro/Psych PSYCHIATRIC DISORDERS Anxiety Depression negative neurological ROS     GI/Hepatic negative GI ROS, Neg liver ROS,   Endo/Other  negative endocrine ROSdiabetes, Gestational  Renal/GU negative Renal ROS  negative genitourinary   Musculoskeletal negative musculoskeletal ROS (+)   Abdominal   Peds  Hematology negative hematology ROS (+)   Anesthesia Other Findings Repeat C/Sx2  Reproductive/Obstetrics (+) Pregnancy                             Anesthesia Physical Anesthesia Plan  ASA: III  Anesthesia Plan: Spinal   Post-op Pain Management:    Induction:   PONV Risk Score and Plan: Treatment may vary due to age or medical condition  Airway Management Planned: Natural Airway  Additional Equipment:   Intra-op Plan:   Post-operative Plan:   Informed Consent: I have reviewed the patients History and Physical, chart, labs and discussed the procedure including the risks, benefits and alternatives for the proposed anesthesia with the patient or authorized representative who has indicated his/her understanding and acceptance.     Dental advisory given  Plan Discussed with: CRNA  Anesthesia Plan Comments:         Anesthesia Quick Evaluation

## 2019-06-18 NOTE — H&P (View-Only) (Signed)
Here for scheduled C/S.   Notified by pre-op staff of patient's elevated BP.  Vitals:   06/18/19 0847 06/18/19 0851  BP:  (!) 139/96  Pulse:  100  Resp:  18  Temp:  97.9 F (36.6 C)  TempSrc:  Oral  SpO2:  100%  Weight: 89.8 kg   Height: 5' 2" (1.575 m)    Reports mild HA last several days, none today, denies visual sx or epigastric pain. Minimal edema, reflexes WNL.  Added CMP, LDH, uric acid to labs, and urine for PCR. COVID-19 negative.  Will inform Dr. Rivard.  Nathania Waldman, CNM 

## 2019-06-18 NOTE — Op Note (Signed)
Preoperative diagnosis: Intrauterine pregnancy at 39 weeks and 3 days with 2 previous cesarean sections  Post operative diagnosis: Same  Anesthesia: Spinal  Anesthesiologist: Dr. Lanetta Inch  Procedure: Repeat low transverse cesarean section  Surgeon: Dr. Katharine Look Ferris Fielden  Assistant: Donnel Saxon CNM  Quantitated blood loss: 298 cc  Procedure:  After being informed of the planned procedure and possible complications including bleeding, infection, injury to other organs, informed consent is obtained. The patient is taken to OR #B and given spinal anesthesia without complication. She is placed in the dorsal decubitus position with the pelvis tilted to the left. She is then prepped and draped in a sterile fashion. A Foley catheter is inserted in her bladder.  After assessing adequate level of anesthesia, we infiltrate the suprapubic area with 20 cc of Marcaine 0.25 and perform a Pfannenstiel incision which is brought down sharply to the fascia. The fascia is entered in a low transverse fashion. Linea alba is dissected. Peritoneum is entered in a midline fashion. An Alexis retractor is easily positioned.   The myometrium is then entered in a low transverse fashion, 2 cm above the vesico-uterine junction ; first with knife and then extended bluntly. Amniotic fluid is clear and abundant (1750cc). We assist the birth of a female  infant in vertex presentation. Mouth and nose are suctioned. The baby is delivered. The cord is clamped and sectioned. The baby is given to the neonatologist present in the room.  10 cc of blood is drawn from the umbilical vein.The placenta is allowed to deliver spontaneously. It is complete and the cord has 3 vessels. Uterine revision is negative.  We proceed with closure of the myometrium in 2 layers: First with a running locked suture of 0 Vicryl, then with a Lembert suture of 0 Vicryl imbricating the first one. Hemostasis is completed with cauterization on peritoneal  edges.  Both paracolic gutters are cleaned. Both tubes and ovaries are assessed and normal. The pelvis is profusely irrigated with warm saline to confirm a satisfactory hemostasis.  Retractors and sponges are removed. Under fascia hemostasis is completed with cauterization. The fascia is then closed with 2 running sutures of 0 Vicryl meeting midline. The wound is irrigated with warm saline and hemostasis is completed with cauterization. The skin is closed with a subcuticular suture of 3-0 Monocryl and Steri-Strips.  Instrument and sponge count is complete x2. Quantitated blood loss is 298 cc.  The procedure is well tolerated by the patient who is taken to recovery room in a well and stable condition.  female baby named Collier Salina was born at 11:24 and received an Apgar of 9  at 1 minute and 9 at 5 minutes.    Specimen: Placenta sent to L & D   Holly Query Denora Wysocki MD 6/25/202012:12 PM

## 2019-06-18 NOTE — Anesthesia Procedure Notes (Signed)
Spinal  Patient location during procedure: OR Start time: 06/18/2019 10:40 AM End time: 06/18/2019 10:50 AM Staffing Anesthesiologist: Freddrick March, MD Performed: anesthesiologist  Preanesthetic Checklist Completed: patient identified, surgical consent, pre-op evaluation, timeout performed, IV checked, risks and benefits discussed and monitors and equipment checked Spinal Block Patient position: sitting Prep: site prepped and draped and DuraPrep Patient monitoring: cardiac monitor, continuous pulse ox and blood pressure Approach: midline Location: L3-4 Injection technique: single-shot Needle Needle type: Pencan  Needle gauge: 24 G Needle length: 9 cm Assessment Sensory level: T6 Additional Notes Functioning IV was confirmed and monitors were applied. Sterile prep and drape, including hand hygiene and sterile gloves were used. The patient was positioned and the spine was prepped. The skin was anesthetized with lidocaine.  Free flow of clear CSF was obtained prior to injecting local anesthetic into the CSF.  The spinal needle aspirated freely following injection.  The needle was carefully withdrawn.  The patient tolerated the procedure well.

## 2019-06-18 NOTE — Interval H&P Note (Signed)
History and Physical Interval Note:  06/18/2019 10:02 AM  Holly Jackson  has presented today for surgery, with the diagnosis of Prior Cesarean Section.  The various methods of treatment have been discussed with the patient and family. After consideration of risks, benefits and other options for treatment, the patient has consented to  Procedure(s) with comments: Repeat CESAREAN SECTION (N/A) - Donnel Saxon Assisting as a surgical intervention.  The patient's history has been reviewed, patient examined, no change in status, stable for surgery.  I have reviewed the patient's chart and labs.  Questions were answered to the patient's satisfaction.     Katharine Look A Juliannah Ohmann

## 2019-06-19 ENCOUNTER — Encounter (HOSPITAL_COMMUNITY): Payer: Self-pay | Admitting: Obstetrics and Gynecology

## 2019-06-19 ENCOUNTER — Inpatient Hospital Stay (HOSPITAL_COMMUNITY): Payer: BC Managed Care – PPO

## 2019-06-19 LAB — CBC
HCT: 36.5 % (ref 36.0–46.0)
Hemoglobin: 11.7 g/dL — ABNORMAL LOW (ref 12.0–15.0)
MCH: 25.1 pg — ABNORMAL LOW (ref 26.0–34.0)
MCHC: 32.1 g/dL (ref 30.0–36.0)
MCV: 78.2 fL — ABNORMAL LOW (ref 80.0–100.0)
Platelets: 228 10*3/uL (ref 150–400)
RBC: 4.67 MIL/uL (ref 3.87–5.11)
RDW: 16.8 % — ABNORMAL HIGH (ref 11.5–15.5)
WBC: 9.2 10*3/uL (ref 4.0–10.5)
nRBC: 0 % (ref 0.0–0.2)

## 2019-06-19 LAB — GLUCOSE, CAPILLARY: Glucose-Capillary: 98 mg/dL (ref 70–99)

## 2019-06-19 MED ORDER — IBUPROFEN 800 MG PO TABS
800.0000 mg | ORAL_TABLET | Freq: Four times a day (QID) | ORAL | Status: DC
  Administered 2019-06-19 – 2019-06-20 (×6): 800 mg via ORAL
  Filled 2019-06-19 (×8): qty 1

## 2019-06-19 MED ORDER — PANTOPRAZOLE SODIUM 40 MG PO TBEC
40.0000 mg | DELAYED_RELEASE_TABLET | Freq: Every day | ORAL | Status: DC
  Administered 2019-06-20 – 2019-06-21 (×2): 40 mg via ORAL
  Filled 2019-06-19 (×2): qty 1

## 2019-06-19 NOTE — Progress Notes (Signed)
MD PROGRESS NOTE  Subjective: Patient seen and evaluated at bedside.  Postpartum Day 1Cesarean Delivery Patient with c/o severe abdominal pain, diffuse worse on the left.  She has nausea no vomiting, she is voiding w/o difficulty and passing flatus  Objective: Vital signs in last 24 hours: Temp:  [97.7 F (36.5 C)-98.7 F (37.1 C)] 98.2 F (36.8 C) (06/26 1402) Pulse Rate:  [92-99] 94 (06/26 1402) Resp:  [16-18] 18 (06/26 1402) BP: (99-128)/(61-90) 128/90 (06/26 1402) SpO2:  [98 %-100 %] 100 % (06/26 1402)  Physical Exam:  General: alert, cooperative and no distress Lochia: appropriate Uterine Fundus: firm Abdomen: Soft, diffusely tender to palpation no rebound or guarding Incision: healing well, no significant drainage, no dehiscence, dressing in place DVT Evaluation: No evidence of DVT seen on physical exam. Negative Homan's sign. No cords or calf tenderness.  Recent Labs    06/18/19 1505 06/19/19 0532  HGB 13.1 11.7*  HCT 40.8 36.5    Assessment/Plan: Status post Cesarean section, with nausea and abdominal pain CT scan abdomen / pelvis Protonix 40mg  daily Motrin 800 mg Encourage ambulation  Gearld Kerstein STACIA 06/19/2019, 4:59 PM

## 2019-06-19 NOTE — Progress Notes (Signed)
Holly Jackson [id number] Postpartum Postoperative Day # 1  Holly Jackson, [id number], [redacted]w[redacted]d, S/P repeat LT Cesarean Section due to elective. Pt had elevated Bp upon admission but PIH labs were neg, no meds.   Subjective: Patient up ad lib, denies syncope or dizziness. Reports consuming regular diet without issues and denies N/V. Patient reports 0 bowel movement + passing flatus.  Denies issues with urination and reports bleeding is "lighter."  Patient is breast and bottlefeeding and reports going well.  Desires undecided for postpartum contraception.  Pain is being appropriately managed with use of po meds. Pt incisional pain has improved with passing of gas, pt walking using heating pads, pt endorses feeling better. Pt waiting on going to CT scan.   Objective: Patient Vitals for the past 24 hrs:  BP Temp Temp src Pulse Resp SpO2  06/19/19 2126 113/71 98.4 F (36.9 C) Oral 96 18 99 %  06/19/19 1402 128/90 98.2 F (36.8 C) Oral 94 18 100 %  06/19/19 1152 126/82 98.4 F (36.9 C) Oral 96 18 99 %  06/19/19 0556 107/61 98.7 F (37.1 C) Oral 93 16 98 %  06/19/19 0159 99/68 98.7 F (37.1 C) Oral 99 18 99 %     Physical Exam:  General: alert, cooperative, appears stated age and no distress Mood/Affect: Happy Lungs: clear to auscultation, no wheezes, rales or rhonchi, symmetric air entry.  Heart: normal rate, regular rhythm, normal S1, S2, no murmurs, rubs, clicks or gallops. Breast: breasts appear normal, no suspicious masses, no skin or nipple changes or axillary nodes. Abdomen:  + bowel sounds, soft, non-tender Incision: healing well, no significant drainage, no dehiscence, no significant erythema, Honeycomb dressing  Uterine Fundus: firm, involution -1 Lochia: appropriate Skin: Warm, Dry. DVT Evaluation: No evidence of DVT seen on physical exam. Negative Homan's sign. No cords or calf tenderness. No significant calf/ankle edema.  Labs: Recent Labs    06/18/19 0858 06/18/19 1505  06/19/19 0532  HGB 13.8 13.1 11.7*  HCT 43.7 40.8 36.5  WBC 8.8 11.6* 9.2    CBG (last 3)  Recent Labs    06/18/19 1239 06/18/19 2255 06/19/19 0554  GLUCAP 98 140* 98     I/O: I/O last 3 completed shifts: In: 3419.1 [I.V.:3319.1; IV Piggyback:100] Out: 2353 [Urine:5225; Blood:326]   Assessment Postpartum Postoperative Day # 1. Holly Jackson, [id number], [redacted]w[redacted]d, S/P repeat LT Cesarean Section due to elective.  Pt stable. -1 Involution. Breast and bottle Feeding. BP stable with neg PIH labs, 113/71. Hemodynamically Stable. CBG 2 H PP WNL, no meds, but was on 14unit NPH @ bedtime with 500mg  PO metformin during pregnancy. CBG WNL. FBS wnl d/c CBG checks, no meds. Pt inscional pain is improving.   Plan: Continue other mgmt as ordered Obesity: VTE Prophylactics, SCD, ambulated as tolerates.  GDM: Discontinue CBG checks.   Pain control: Motrin/Tylenol/Narcotics PRN Incisional pain: Pending CT scan, 800mg  motrin, 650mg  tylenol and oxy IR 5-10mg  PRN, pt walking  Education given regarding options for contraception, including barrier methods, injectable contraception, IUD placement, oral contraceptives.  Breastfeeding, Lactation consult and Circumcision prior to discharge  Dr. Mancel Bale updated.   [facility] NP-C, CNM 06/19/2019, 9:30 PM

## 2019-06-19 NOTE — Progress Notes (Signed)
Pt c/o feeling pain in her stomach. She stated that heat pads had not helped. I offered an ice pack. Pt wanted to try. I brought her a peri ice pack to put on her stomach. Pt c/o she could not feel the cold from it. Then about 30 seconds later the cold was causing more pain. Instructed pt not to use the ice cpack. Reassured that Dr. Alwyn Pea had been called and was on her way. Pt then stated she felt like she had a fever. I told her that her temperature was normal. Pt then asked about bp. I told her the reading. Informed pt that the pain she was having could cause her bp to be a little elevated and also make her feel as if she had a fever. Told pt and support person if they needed [place] or if pt started feeling any different to let us know. RN Theodosia Quay informed.

## 2019-06-19 NOTE — Progress Notes (Signed)
Holly Jackson Ribaudo [id number] Postpartum Postoperative Day # 1  Holly Jackson Mierzejewski, [id number], 719w0d, S/P repeat LT Cesarean Section due to elective. Pt had elevated Bp upon admission but PIH labs were neg, no meds.   Subjective: Patient up ad lib, denies syncope or dizziness. Reports consuming regular diet without issues and denies N/V. Patient reports 0 bowel movement + passing flatus.  Denies issues with urination and reports bleeding is "lighter."  Patient is breast and bottlefeeding and reports going well.  Desires undecided for postpartum contraception.  Pain is being appropriately managed with use of po meds. Pt has incisional pain earlier, was not being managed with fentanyl, but after starting po oxy pt feels relief.   Objective: Patient Vitals for the past 24 hrs:  BP Temp Temp src Pulse Resp SpO2 Height Weight  06/19/19 0159 99/68 98.7 F (37.1 C) Oral 99 18 99 % - -  06/18/19 2130 120/88 98.3 F (36.8 C) Oral 92 16 100 % - -  06/18/19 1730 - 97.7 F (36.5 C) Oral - 18 99 % - -  06/18/19 1600 101/60 - - 79 18 98 % - -  06/18/19 1500 118/76 97.7 F (36.5 C) Oral 73 17 100 % - -  06/18/19 1356 128/75 97.8 F (36.6 C) - 84 16 100 % - -  06/18/19 1330 113/69 - - - 12 100 % - -  06/18/19 1315 120/69 97.8 F (36.6 C) - 84 18 100 % - -  06/18/19 1300 112/66 - - 80 17 100 % - -  06/18/19 1245 96/61 - - 83 18 98 % - -  06/18/19 1230 103/76 - - 88 16 98 % - -  06/18/19 1215 (!) 91/56 - - 89 18 96 % - -  06/18/19 1212 110/67 97.6 F (36.4 C) Other 89 (!) 22 - - -  06/18/19 0941 (!) 128/92 - - - - - - -  06/18/19 0913 110/67 - - - - - - -  06/18/19 0851 (!) 139/96 97.9 F (36.6 C) Oral 100 18 100 % - -  06/18/19 0847 - - - - - - 5\' 2"  (1.575 m) 89.8 kg     Physical Exam:  General: alert, cooperative, appears stated age and no distress Mood/Affect: Happy Lungs: clear to auscultation, no wheezes, rales or rhonchi, symmetric air entry.  Heart: normal rate, regular rhythm, normal S1, S2, no  murmurs, rubs, clicks or gallops. Breast: breasts appear normal, no suspicious masses, no skin or nipple changes or axillary nodes. Abdomen:  + bowel sounds, soft, non-tender Incision: healing well, no significant drainage, no dehiscence, no significant erythema, Honeycomb dressing  Uterine Fundus: firm, involution -1 Lochia: appropriate Skin: Warm, Dry. DVT Evaluation: No evidence of DVT seen on physical exam. Negative Homan's sign. No cords or calf tenderness. No significant calf/ankle edema.  Labs: Recent Labs    06/18/19 0858 06/18/19 1505  HGB 13.8 13.1  HCT 43.7 40.8  WBC 8.8 11.6*    CBG (last 3)  Recent Labs    06/18/19 0859 06/18/19 1239 06/18/19 2255  GLUCAP 93 98 140*     I/O: I/O last 3 completed shifts: In: 2583.2 [I.V.:2483.2; IV Piggyback:100] Out: 2251 [Urine:1925; Blood:326]   Assessment Postpartum Postoperative Day # 1. Holly Jackson Serratore, [id number], 219w0d, S/P repeat LT Cesarean Section due to elective.  Pt stable. -1 Involution. Breast and bottle Feeding. BP stable with neg PIH labs, 99/68. Hemodynamically Stable. CBG 2 H PP WNL, no meds, but was on 14unit NPH @  bedtime with 500mg  PO metformin during pregnancy.  Plan: Continue other mgmt as ordered Obesity: VTE Prophylactics, SCD, ambulated as tolerates.  GDM: monitor FBS and 2 H PP. No meds PP.  Pain control: Motrin/Tylenol/Narcotics PRN Education given regarding options for contraception, including barrier methods, injectable contraception, IUD placement, oral contraceptives.  Breastfeeding, Lactation consult and Circumcision prior to discharge  Dr. Alwyn Pea to be updated on patient status when she assumes care of pt @ 0700.  [facility] NP-C, CNM 06/19/2019, 4:11 AM

## 2019-06-19 NOTE — Progress Notes (Signed)
Pt taken to CT scan via of transporter with wheelchair

## 2019-06-19 NOTE — Progress Notes (Signed)
CSW acknowledges consult for anxiety. CSW went to speak with MOB at beside. CSW made aware by FOB that MOB is in the  restroom at this time. CSW will try back at a later time.      Virgie Dad Margarie Mcguirt, MSW, LCSW-A Women's and [facility] [phone]

## 2019-06-19 NOTE — Lactation Note (Signed)
This note was copied from a baby's chart. Lactation Consultation Note Baby 55 hrs old. Mom had called out during the night wanting formula worried baby wasn't getting enough. Mom's 3rd baby. She has 38 yr old that she BF for 18 months and 38 yr old that she BF for 6 months.  Mom gave formula and stated she felt better. Hand expressed showing mom colostrum. Mom stated the nurse showed her colostrum but its not much and didn't think it's enough because she doesn't know how much the baby is getting. Discussed newborn feeding habits, behavior, STS, I&O, breast massage, supply and demand. Mom has everted nipples.  Positions and support discussed. Mom had difficulty latching in cradle position. Football was better at this time. Baby still feeding as LC leaving. Mom thankful and relieved baby is feeding.  Lactation brochure given. Encouraged to call for assistance or questions.  Patient Name: Holly Jackson [id number] Date: 06/19/2019 Reason for consult: Initial assessment;Term   Maternal Data Has patient been taught Hand Expression?: Yes Does the patient have breastfeeding experience prior to this delivery?: Yes  Feeding Feeding Type: Breast Fed Nipple Type: Slow - flow  LATCH Score Latch: Grasps breast easily, tongue down, lips flanged, rhythmical sucking.  Audible Swallowing: A few with stimulation  Type of Nipple: Everted at rest and after stimulation  Comfort (Breast/Nipple): Soft / non-tender  Hold (Positioning): Assistance needed to correctly position infant at breast and maintain latch.  LATCH Score: 8  Interventions Interventions: Breast feeding basics reviewed;Adjust position;Assisted with latch;Support pillows;Skin to skin;Position options;Breast massage;Hand express;Breast compression  Lactation Tools Discussed/Used     Consult Status Consult Status: Follow-up Date: 06/19/19(in pm) Follow-up type: In-patient    Theodoro Kalata 06/19/2019, 5:36 AM

## 2019-06-19 NOTE — Progress Notes (Signed)
CSW received consult for hx of Anxiety and Depression.  CSW met with MOB to offer support and complete assessment.    CSW congratulated MOB upon entering the room. CSW advised MOB of the reason for the visit. MOB reports that she was diagnosed with anxiety depression and BPD in 2018 after her last child. MOB reports that she was place don medications at that time however is no longer on meds. MOB reports that she was dealing with PPD and was very sad and feeling lonely. MOB expressed that this lasted about 2-22-month for her. MOB reports that she was in therapy in 2-18, however is no longer in therapy  as she feels that she doesn't need it anymore.   MOB expressed having support from her family during this time. MOB expressed that she has all needed items to care for infant [facility]    CSW provided education regarding the baby blues period vs. perinatal mood disorders, discussed treatment and gave resources for mental health follow up if concerns arise.  CSW recommends self-evaluation during the postpartum time period using the New Mom Checklist from Postpartum Progress and encouraged MOB to contact a medical professional if symptoms are noted at any time.   CSW provided review of Sudden Infant Death Syndrome (SIDS) precautions.   CSW identifies no further need for intervention and no barriers to discharge at this time.     KVirgie DadWiley, MSW, LCSW-A [facility] and [place] [place]([phone]

## 2019-06-19 NOTE — Progress Notes (Signed)
Returned for CT via of transporter in wheelchair

## 2019-06-20 LAB — RPR: RPR Ser Ql: NONREACTIVE

## 2019-06-20 NOTE — Progress Notes (Signed)
Holly Jackson [id number] Postpartum Postoperative Day # 2  Holly Jackson, [id number], [redacted]w[redacted]d, S/P repeat LT Cesarean Section due to elective. Pt had elevated Bp upon admission but PIH labs were neg, no meds.   Subjective: Patient up ad lib, denies syncope or dizziness. Reports consuming regular diet without issues and denies N/V. Patient reports 0 bowel movement + passing flatus.  Denies issues with urination and reports bleeding is "lighter."  Patient is breast and bottlefeeding and reports going well.  Desires undecided for postpartum contraception.  Pain is being appropriately managed with use of po meds. Pt incisional pain has improved with passing of gas, pt walking using heating pads, pt endorses feeling better. Pt tolerated CT scan, I discussed the results with the pt and the pt stated she would get the results to her PCP for her to f/u in 6 months. See details of CT results below.   Objective: Patient Vitals for the past 24 hrs:  BP Temp Temp src Pulse Resp SpO2  06/19/19 2126 113/71 98.4 F (36.9 C) Oral 96 18 99 %  06/19/19 1402 128/90 98.2 F (36.8 C) Oral 94 18 100 %  06/19/19 1152 126/82 98.4 F (36.9 C) Oral 96 18 99 %  06/19/19 0556 107/61 98.7 F (37.1 C) Oral 93 16 98 %     Physical Exam:  General: alert, cooperative, appears stated age and no distress Mood/Affect: Happy Lungs: clear to auscultation, no wheezes, rales or rhonchi, symmetric air entry.  Heart: normal rate, regular rhythm, normal S1, S2, no murmurs, rubs, clicks or gallops. Breast: breasts appear normal, no suspicious masses, no skin or nipple changes or axillary nodes. Abdomen:  + bowel sounds, soft, non-tender Incision: healing well, no significant drainage, no dehiscence, no significant erythema, Honeycomb dressing  Uterine Fundus: firm, involution -1 Lochia: appropriate Skin: Warm, Dry. DVT Evaluation: No evidence of DVT seen on physical exam. Negative Homan's sign. No cords or calf tenderness. No  significant calf/ankle edema.  Labs: Recent Labs    06/18/19 0858 06/18/19 1505 06/19/19 0532  HGB 13.8 13.1 11.7*  HCT 43.7 40.8 36.5  WBC 8.8 11.6* 9.2    CBG (last 3)  Recent Labs    06/18/19 1239 06/18/19 2255 06/19/19 0554  GLUCAP 98 140* 98     I/O: I/O last 3 completed shifts: In: 3419.1 [I.V.:3319.1; IV Piggyback:100] Out: 2703 [Urine:5225; Blood:326]   Assessment Postpartum Postoperative Day # 2. Holly Jackson, [id number], [redacted]w[redacted]d, S/P repeat LT Cesarean Section due to elective.  Pt stable. -1 Involution. Breast and bottle Feeding. BP stable with neg PIH labs, 113/71. Hemodynamically Stable. CBG 2 H PP & FBS were WNL, therefore d/c monitoring, no meds, but was on 14unit NPH @ bedtime with 500mg  PO metformin during pregnancy. Pt inscional pain is improving. CT scan on 6/26 resulted:  IMPRESSION: 1. Expected postsurgical changes related to recent Caesarean section including small volume pneumoperitoneum and small volume hyperdense free fluid in the patient's pelvis. 2. Large amount of stool throughout the colon. Normal appendix in the right lower quadrant. No evidence of a small-bowel obstruction. 3. Gas within the urinary bladder, presumably from recent instrumentation. 4. There is a 6 mm pulmonary nodule in the right middle lobe. Non-contrast chest CT at 6-12 months is recommended. If the nodule is stable at time of repeat CT, then future CT at 18-24 months (from today's scan) is considered optional for low-risk patients, but is recommended for high-risk patients. This recommendation follows the consensus statement: Guidelines for Management of Incidental  Pulmonary Nodules Detected on CT Images: From the Fleischner Society 2017; Radiology 2017; [id number].  Previous CT in 03/24/2018 showed 5mm middle lobe nodule, was told by pcp was normal. Pt being followed by her PCP for this.   Dr Su Hiltoberts aware of results.   Plan: Continue other mgmt as ordered Obesity: VTE  Prophylactics, SCD, ambulated as tolerates, Lovenox prophylactics.  GDM: Discontinue CBG checks.   Lung Nodule: Pt will f/u with pcp for repeat CT in 6-12 months.  Pain control: Motrin/Tylenol/Narcotics PRN Incisional pain: Pending CT scan, 800mg  motrin, 650mg  tylenol and oxy IR 5-10mg  PRN, pt walking  Education given regarding options for contraception, including barrier methods, injectable contraception, IUD placement, oral contraceptives.  Breastfeeding, Lactation consult and Circumcision prior to discharge  Dr. Mora ApplPinn to be updated @ 0700 in report with Su Hiltoberts via telephone report.  [facility] Aylee Littrell NP-C, CNM 06/20/2019, 5:35 AM

## 2019-06-20 NOTE — Progress Notes (Signed)
Dr Alwyn Pea requested baby female to be in [facility] @ 1200 for circ to be completed, 4s NBN called and aware.

## 2019-06-21 ENCOUNTER — Encounter (HOSPITAL_COMMUNITY): Payer: Self-pay | Admitting: *Deleted

## 2019-06-21 DIAGNOSIS — R911 Solitary pulmonary nodule: Secondary | ICD-10-CM | POA: Diagnosis present

## 2019-06-21 DIAGNOSIS — G8918 Other acute postprocedural pain: Secondary | ICD-10-CM | POA: Diagnosis not present

## 2019-06-21 MED ORDER — IBUPROFEN 800 MG PO TABS: 800.0000 mg | ORAL_TABLET | Freq: Four times a day (QID) | ORAL | 0 refills | Status: DC

## 2019-06-21 MED ORDER — OXYCODONE HCL 5 MG PO TABS: 5.0000 mg | ORAL_TABLET | ORAL | 0 refills | Status: DC | PRN

## 2019-06-21 NOTE — Lactation Note (Signed)
This note was copied from a baby's chart. Lactation Consultation Note  Patient Name: Holly Jackson [id number] Date: 06/21/2019 Reason for consult: Follow-up assessment;Term  P3 mother whose infant is now 36 hours old.  Mother breast fed her first child (now 38 years old) for 65 months and her second child (now 77 years old) for 6 months.  Mother stated that baby has been feeding much better now.  She feels comfortable with his latching and denies pain with feeding.  Mother has given some formula supplementation due to "not getting enough" from mother.  She will continue to feed 8-12 times/24 hours or sooner if baby shows cues.  She is familiar with hand expression.  Engorgement prevention/treatment discussed.  Mother has experienced engorgement and remembers how painful that was.    Mother has our OP phone number for questions/concerns after discharge.  Father present.  Mother has a manual pump and a DEBP for home use.   Maternal Data Formula Feeding for Exclusion: No Has patient been taught Hand Expression?: Yes Does the patient have breastfeeding experience prior to this delivery?: Yes  Feeding Feeding Type: Formula Nipple Type: Slow - flow  LATCH Score                   Interventions    Lactation Tools Discussed/Used     Consult Status Consult Status: Complete Date: 06/21/19 Follow-up type: In-patient    Little Ishikawa 06/21/2019, 9:53 AM

## 2019-06-21 NOTE — Lactation Note (Signed)
This note was copied from a baby's chart. Lactation Consultation Note  Patient Name: [name] [id number] Date: 06/21/2019 Reason for consult: Follow-up assessment   LC Follow Up:  Mother requested lactation assistance.  Mother had just finished feeding baby 60 mls of formula when I arrived.  She stated that she could not latch baby to breast and that he was not interested.  Mother asking to pump with the DEBP.  She is full but not engorged.  Set mother up with the DEBP and instructions for use given.  As soon as she has finished pumping she will be ready for discharge.  Mother appreciative.                  Consult Status Consult Status: Complete Date: 06/21/19 Follow-up type: Call as needed    Lanice Schwab Evellyn Tuff 06/21/2019, 2:44 PM

## 2019-06-21 NOTE — Discharge Summary (Signed)
Repeat CS OB Discharge Summary     Patient Name: Holly Jackson DOB: 06/25/1981 MRN: [id number]  Date of admission: 06/18/2019 Delivering MD: Silverio LayIVARD, SANDRA  Date of delivery: 06/18/2019 Type of delivery: Repeat CS  Newborn Data: Sex: Baby Female Circumcision: performed in pt by Dr Mora ApplPinn Live born female  Birth Weight: 8 lb 0.2 oz (3635 g) APGAR: 9, 10  Newborn Delivery   Birth date/time: 06/18/2019 11:24:00 Delivery type: C-Section, Low Transverse Trial of labor: No C-section categorization: Repeat      Feeding: breast and bottle Infant being discharge to home with mother in stable condition.   Admitting diagnosis: Prior Cesarean Section Intrauterine pregnancy: 412w2d     Secondary diagnosis:  Active Problems:   Allergy to penicillin - causes SOB; mild resp distress   Allergy to sulfa drugs - mod-severe rash   Generalized anxiety disorder   Hx of abdominoplasty   Previous cesarean delivery, antepartum condition or complication   History of depression   AMA (advanced maternal age) multigravida 35+, third trimester   Velamentous insertion of umbilical cord   Mild persistent asthma   Single umbilical artery   Gestational diabetes   Infertility, female   History of cholestasis during pregnancy--2015 pregnancy   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, empty 2nd sac at 9 weeks   Encounter for maternal care for low transverse scar from previous cesarean delivery   Cesarean delivery delivered   Normal postpartum course   Alteration in comfort associated with postoperative pain   Incidental lung nodule, > 3mm and < 8mm                                Complications: None                                                              Intrapartum Procedures: cesarean: low cervical, transverse and elevated bp with no dx and neg labs Postpartum Procedures: CT scan due to inscional pain, see onments below Complications-Operative and Postpartum: none Augmentation:  AROM and @ delivery   History of Present Illness: Holly Jackson is a 38 y.o. female, [id number], who presents at [redacted]w[redacted]d weeks gestation. [facility]  Her pregnancy has been complicated by:  Patient Active Problem List   Diagnosis Date Noted  . Normal postpartum course 06/21/2019  . Alteration in comfort associated with postoperative pain 06/21/2019  . Incidental lung nodule, > 3mm and < 8mm 06/21/2019  . Encounter for maternal care for low transverse scar from previous cesarean delivery 06/18/2019  . Cesarean delivery delivered 06/18/2019  . Single umbilical artery 06/02/2019  . Gestational diabetes 06/02/2019  . Infertility, female 06/02/2019  . History of cholestasis during pregnancy--2015 pregnancy 06/02/2019  . 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 Mild persistent asthma 05/23/2017  . Atelectasis of left lung 03/07/2017  . Allergy to penicillin - causes SOB; mild resp distress 03/01/2017  . Allergy to sulfa drugs - mod-severe rash 03/01/2017  . History of depression 03/01/2017  . AMA (advanced maternal age) multigravida 35+, third trimester 03/01/2017  . Velamentous insertion of umbilical cord  03/01/2017  . Previous cesarean delivery, antepartum condition or complication 09/22/2014  . Generalized anxiety disorder 09/17/2014  . Hx of abdominoplasty 09/17/2014    Hospital course:  Sceduled C/S   38 y.o. yo G3P2002 at 2489w2d was admitted to the hospital 06/18/2019 for scheduled cesarean section with the following indication:Elective Repeat.  Membrane Rupture Time/Date: 11:23 AM ,06/18/2019   Patient delivered a Viable infant.06/18/2019  Details of operation can be found in separate operative note.  Pateint had an uncomplicated postpartum course.  She is ambulating, tolerating a regular diet, passing flatus, and urinating well. Patient is discharged home in stable  condition on  06/21/19        Hospital Course--Scheduled Cesarean: Patient was admitted on 06/18/2019 for a scheduled repeat cesarean delivery.   She was taken to the operating room, where Dr. Estanislado Pandyivard performed a repeat LTCS under spinal anesthesia, with delivery of a viable baby female, which circ'ed was performed in pt by Dr Mora ApplPinn, with weight and Apgars as listed below. Infant was in good condition and remained at the patient's bedside.  OR staff noted elevated BP during pre-op and preeclampsia labs were drawn and noted to be negative, Bp PP normotensive  And currently 114/78, denies ha ruq pain or vision changes. The patient was taken to recovery in good condition.  Patient planned to breast feed, but is supplementing right now as well, stating her milk has not come in, pt was educated on breastfeeding and milk supply.  On post-op day 2, patient was doing well, tolerating a regular diet, with Hgb of 11.7, which was a drop from 13.8 pre-op. Pt was also on 14 unit NPH with metformin at bedtime during pregnancy for dx of gdmA2, but no meds PP and CBG all wnl PP. Pt will follow up with ccob @ 6 weeks PP for 2h gtt.  Throughout her stay, her physical exam was WNL, her incision was CDI, and her vital signs remained stable.  By post-op day 2-3, she had good pain control with po med. On POD#1&some of 2 pt had incisional pain that was not controlled with fentanyl, pt started getting pain relif with po oxy IR, rns encourage her to move around and use heating pack, Dr Mora ApplPinn ordered an pelvic/abd CT due to unusual pain in lower abdomen noted, resulted:   IMPRESSION: 1. Expected postsurgical changes related to recent Caesarean section including small volume pneumoperitoneum and small volume hyperdense free fluid in the patient's pelvis. 2. Large amount of stool throughout the colon. Normal appendix in the right lower quadrant. No evidence of a small-bowel obstruction. 3. Gas within the urinary bladder, presumably from  recent instrumentation. 4. There is a 6 mm pulmonary nodule in the right middle lobe. Non-contrast chest CT at 6-12 months is recommended. If the nodule is stable at time of repeat CT, then future CT at 18-24 months (from today's scan) is considered optional for low-risk patients, but is recommended for high-risk patients. This recommendation follows the consensus statement: Guidelines for Management of Incidental Pulmonary Nodules Detected on CT Images: From the Fleischner Society 2017; Radiology 2017; 284:228-243.   Pt aware of 5mm nodule in right  Middle lobe noted a year ago and endorses she will have her record sent to her PCP and followed by then for repeat CT if her PCP deems it necessary. Pt denies having any CP or sob, no fever or new coughs, no swelling her lower extremities, although pt stated she thinks she is swollen, but based on PE  no edema appreciated. Once pt started to move around on POD#2 her pain was much better under control, she was passing gases which helped, and had her first BM on POD#3 today, pt endorses pain feels much better now. She was deemed to have received the full benefit of her hospital stay, and was discharged home in stable condition.  Contraceptive choice was undecided, education given.  Pt also has h/o anxiety and depression, pt denies si/hi and feels mood is stable.   Physical exam  Vitals:   06/20/19 0500 06/20/19 0920 06/20/19 2148 06/21/19 0508  BP: 115/65 119/79 135/90 114/78  Pulse: 92 92 89 88  Resp: 16 16 18 16   Temp: 98.4 F (36.9 C) 98.1 F (36.7 C) 98 F (36.7 C) 98.2 F (36.8 C)  TempSrc: Oral Oral Oral Oral  SpO2: 99%  100%   Weight:      Height:       General: alert, cooperative and no distress Lochia: appropriate Uterine Fundus: firm Incision: Healing well with no significant drainage, No significant erythema, Dressing is clean, dry, and intact, honeycomb dressing CDI Perineum: Intact DVT Evaluation: No evidence of DVT seen on  physical exam. Negative Homan's sign. No cords or calf tenderness. No significant calf/ankle edema.  Labs: Lab Results  Component Value Date   WBC 9.2 06/19/2019   HGB 11.7 (L) 06/19/2019   HCT 36.5 06/19/2019   MCV 78.2 (L) 06/19/2019   PLT 228 06/19/2019   CMP Latest Ref Rng & Units 06/18/2019  Glucose 70 - 99 mg/dL -  BUN 6 - 20 mg/dL -  Creatinine 1.610.44 - 0.961.00 mg/dL 0.450.55  Sodium 409135 - 811145 mmol/L -  Potassium 3.5 - 5.1 mmol/L -  Chloride 98 - 111 mmol/L -  CO2 22 - 32 mmol/L -  Calcium 8.9 - 10.3 mg/dL -  Total Protein 6.5 - 8.1 g/dL -  Total Bilirubin 0.3 - 1.2 mg/dL -  Alkaline Phos 38 - 914126 U/L -  AST 15 - 41 U/L -  ALT 0 - 44 U/L -    Date of discharge: 06/21/2019 Discharge Diagnoses: Term Pregnancy-delivered Discharge instruction: per After Visit Summary and "Baby and Me Booklet".  After visit meds:   Activity:           unrestricted and pelvic rest Advance as tolerated. Pelvic rest for 6 weeks.  Diet:                routine Medications: PNV, Ibuprofen, Colace and oxy IR Postpartum contraception: Undecided Condition:  Pt discharge to home with baby in stable GDMA2: f/u @ 6wppv for 2h gtt for overt diabetes testing.  Incidental lung nodule: F/U with PCP and sent results to your pcp for follow up.  Anxiet/Depression: report si/hi. F/u in 2 weeks for a mental health visit if indicated.   Meds: Allergies as of 06/21/2019      Reactions   Penicillins Shortness Of Breath   Has patient had a PCN reaction causing immediate rash, facial/tongue/throat swelling, SOB or lightheadedness with hypotension: Yes Has patient had a PCN reaction causing severe rash involving mucus membranes or skin necrosis: No Has patient had a PCN reaction that required hospitalization No Has patient had a PCN reaction occurring within the last 10 years: No If all of the above answers are "NO", then may proceed with Cephalosporin use.   Sulfa Antibiotics Rash   Sulfasalazine Rash       Medication List    STOP taking these medications  insulin NPH Human 100 UNIT/ML injection Commonly known as: NOVOLIN N   metFORMIN 500 MG tablet Commonly known as: GLUCOPHAGE   pantoprazole 40 MG tablet Commonly known as: PROTONIX     TAKE these medications   acetaminophen 500 MG tablet Commonly known as: TYLENOL Take 500 mg by mouth every 6 (six) hours as needed for mild pain or moderate pain.   doxylamine (Sleep) 25 MG tablet Commonly known as: UNISOM Take 25 mg by mouth at bedtime.   ibuprofen 800 MG tablet Commonly known as: ADVIL Take 1 tablet (800 mg total) by mouth every 6 (six) hours.   oxyCODONE 5 MG immediate release tablet Commonly known as: Oxy IR/ROXICODONE Take 1 tablet (5 mg total) by mouth every 4 (four) hours as needed for moderate pain.   prenatal multivitamin Tabs tablet Take 1 tablet by mouth at bedtime.            Discharge Care Instructions  (From admission, onward)         Start     Ordered   06/21/19 0000  Discharge wound care:    Comments: Take dressing off on day 5-7 postpartum.  Report increased drainage, redness or warmth. Clean with water, let soap trickle down body. Can leave steri strips on until they fall off or take them off gently at day 10. Keep open to air, clean and dry.   06/21/19 1115          Discharge Follow Up:  [facility] Follow up in 4 week(s).   Specialty: Obstetrics and Gynecology Why: 4 week incision check and 6 with PPV. Can also make a 2 week f/u for a mental health check if needed.  Contact information: [address]. [address] [place] [id number] [place], NP-C, CNM 06/21/2019, 11:37 AM  Noralyn Pick, [place]

## 2019-08-03 DIAGNOSIS — Z124 Encounter for screening for malignant neoplasm of cervix: Secondary | ICD-10-CM | POA: Diagnosis not present

## 2019-08-25 DIAGNOSIS — O99345 Other mental disorders complicating the puerperium: Secondary | ICD-10-CM | POA: Diagnosis not present

## 2019-08-25 DIAGNOSIS — Z308 Encounter for other contraceptive management: Secondary | ICD-10-CM | POA: Diagnosis not present

## 2019-09-01 ENCOUNTER — Telehealth: Payer: Self-pay

## 2019-09-01 NOTE — Telephone Encounter (Signed)
No, I did not prescribe an antidepressant, it looks like OB/GYN might have?  If she would like to discuss changing medications, can set up a visit, or we can talk more about it at her upcoming appointment 09/09/2019.

## 2019-09-01 NOTE — Telephone Encounter (Signed)
Pt left a vm msg stating she was given a rx for depression. As per pt, the rx is causing her to have insomnia. She did not disclose name of depression med she is taking. Pt is requesting a rx for ambien. Pt request rx be sent to [place]. Pls advise.

## 2019-09-02 NOTE — Telephone Encounter (Signed)
Left a detailed vm msg for pt regarding provider's note. Direct call back info provided.  

## 2019-09-09 ENCOUNTER — Ambulatory Visit (INDEPENDENT_AMBULATORY_CARE_PROVIDER_SITE_OTHER): Payer: BC Managed Care – PPO | Admitting: Osteopathic Medicine

## 2019-09-09 ENCOUNTER — Other Ambulatory Visit: Payer: Self-pay

## 2019-09-09 VITALS — BP 144/80 | HR 74 | Temp 97.7°F | Wt 172.3 lb

## 2019-09-09 DIAGNOSIS — Z23 Encounter for immunization: Secondary | ICD-10-CM | POA: Diagnosis not present

## 2019-09-09 DIAGNOSIS — R911 Solitary pulmonary nodule: Secondary | ICD-10-CM | POA: Diagnosis not present

## 2019-09-09 DIAGNOSIS — L708 Other acne: Secondary | ICD-10-CM

## 2019-09-09 DIAGNOSIS — O99345 Other mental disorders complicating the puerperium: Secondary | ICD-10-CM | POA: Diagnosis not present

## 2019-09-09 DIAGNOSIS — F53 Postpartum depression: Secondary | ICD-10-CM

## 2019-09-09 MED ORDER — CLINDAMYCIN PHOS-BENZOYL PEROX 1-5 % EX GEL: Freq: Two times a day (BID) | CUTANEOUS | 1 refills | Status: DC

## 2019-09-09 MED ORDER — TRAZODONE HCL 50 MG PO TABS: 25.0000 mg | ORAL_TABLET | Freq: Every evening | ORAL | 1 refills | Status: DC | PRN

## 2019-09-09 MED ORDER — SERTRALINE HCL 50 MG PO TABS: 50.0000 mg | ORAL_TABLET | Freq: Every day | ORAL | 0 refills | Status: DC

## 2019-09-09 NOTE — Progress Notes (Signed)
HPI: Holly Jackson is a 38 y.o. female who  has a past medical history of Anxiety, Asthma, Cholestasis of pregnancy, Depression, Gestational diabetes, IBS (irritable bowel syndrome), Newborn product of in vitro fertilization (IVF) pregnancy, Vaginal Pap smear, abnormal, and Velamentous insertion of umbilical cord.  she presents to [facility] today, 09/09/19,  for chief complaint of:  Rash Sleep/Depression "Scan results"    Depression:  Was given Zoloft for depression Was affecting sleep so was given Ambien  Previously on Trazodone which helped sleep  Zoloft is working great for the moods so she's reluctant to switch   Depression screen [facility] 2/9 09/09/2019 04/07/2019 02/11/2019  Decreased Interest 0 0 1  Down, Depressed, Hopeless 0 0 1  PHQ - 2 Score 0 0 2  Altered sleeping 3 - 3  Tired, decreased energy 1 - 3  Change in appetite 3 - 2  Feeling bad or failure about yourself  0 - 1  Trouble concentrating 2 - 0  Moving slowly or fidgety/restless 2 - 0  Suicidal thoughts 0 - 0  PHQ-9 Score 11 - 11  Difficult doing work/chores Extremely dIfficult - Somewhat difficult   GAD 7 : Generalized Anxiety Score 09/09/2019 02/11/2019 05/15/2018  Nervous, Anxious, on Edge 3 0 2  Control/stop worrying 3 0 2  Worry too much - different things 3 0 2  Trouble relaxing 3 0 1  Restless 3 0 0  Easily annoyed or irritable 3 0 2  Afraid - awful might happen 3 0 2  Total GAD 7 Score 21 0 11  Anxiety Difficulty Extremely difficult Not difficult at all Somewhat difficult     Reports some kind of "heart scan" was done.  All I can see of possible relevance in the chart Is CT Abd/Pelv from 06/19/19 w/ incidental lung nodule:   "There is a 6 mm pulmonary nodule in the right middle lobe. Non-contrast chest CT at 6-12 months is recommended. If the nodule is stable at time of repeat CT, then future CT at 18-24 months (from today's scan) is considered optional for low-risk  patients, but is recommended for high-risk patients. This recommendation follows the consensus statement: Guidelines for Management of Incidental Pulmonary Nodules Detected on CT Images: From the Fleischner Society 2017; Radiology 2017; 284:228-243."  Noted atherosclerosis aortic. I can't see anything obvious on personal review of the scan     Rash: Acne on chin and upper back since giving birth.       At today's visit 09/09/19 ... PMH, PSH, FH reviewed and updated as needed.  Current medication list and allergy/intolerance hx reviewed and updated as needed. (See remainder of HPI, ROS, Phys Exam below)   No results found.  No results found for this or any previous visit (from the past 72 hour(s)).        ASSESSMENT/PLAN: The primary encounter diagnosis was Postpartum depression. Diagnoses of Need for influenza vaccination, Other acne, and Lung nodule were also pertinent to this visit.  Pt would like to ocme off Zoloft at some point, we discussed waiting a bit if possible to prevent recurrence   Acne - Rx below  On personal review of scan I don't see obvious atherosclerosis but will recheck CT when due - reminder set in Epic    Orders Placed This Encounter  Procedures  . Flu Vaccine QUAD 6+ mos PF IM (Fluarix Quad PF)     Meds ordered this encounter  Medications  . sertraline (ZOLOFT) 50 MG  tablet    Sig: Take 1 tablet (50 mg total) by mouth daily.    Dispense:  90 tablet    Refill:  0  . traZODone (DESYREL) 50 MG tablet    Sig: Take 0.5-2 tablets (25-100 mg total) by mouth at bedtime as needed for sleep.    Dispense:  60 tablet    Refill:  1  . clindamycin-benzoyl peroxide (BENZACLIN) gel    Sig: Apply topically 2 (two) times daily.    Dispense:  50 g    Refill:  1        Follow-up plan: Return in about 2 months (around 11/09/2019) for recheck on medications, talk about coming off Zoloft. Schedule follow-up CT fo rlung nodules  .                                                 ################################################# ################################################# ################################################# #################################################    No outpatient medications have been marked as taking for the 09/09/19 encounter (Office Visit) with Emeterio Reeve, DO.    Allergies  Allergen Reactions  . Penicillins Shortness Of Breath    Has patient had a PCN reaction causing immediate rash, facial/tongue/throat swelling, SOB or lightheadedness with hypotension: Yes Has patient had a PCN reaction causing severe rash involving mucus membranes or skin necrosis: No Has patient had a PCN reaction that required hospitalization No Has patient had a PCN reaction occurring within the last 10 years: No If all of the above answers are "NO", then may proceed with Cephalosporin use.    . Sulfa Antibiotics Rash  . Sulfasalazine Rash       Review of Systems:  Constitutional: No recent illness  HEENT: No  headache, no vision change  Cardiac: No  chest pain, No  pressure, No palpitations  Respiratory:  No  shortness of breath. No  Cough  Gastrointestinal: No  abdominal pain, no change on bowel habits  Skin: +Rash  Neurologic: No  weakness, No  Dizziness  Psychiatric: +concerns with depression, No  concerns with anxiety  Exam:  BP (!) 144/80 (BP Location: Left Arm, Patient Position: Sitting, Cuff Size: Normal)   Pulse 74   Temp 97.7 F (36.5 C) (Oral)   Wt 172 lb 4.8 oz (78.2 kg)   BMI 31.51 kg/m   Constitutional: VS see above. General Appearance: alert, well-developed, well-nourished, NAD  Neck: No masses, trachea midline.   Respiratory: Normal respiratory effort. no wheeze, no rhonchi, no rales  Cardiovascular: S1/S2 normal, no murmur, no rub/gallop auscultated. RRR.   Musculoskeletal: Gait normal. Symmetric and  independent movement of all extremities  Neurological: Normal balance/coordination. No tremor.  Skin: warm, dry, intact. Acne on chin and upper back   Psychiatric: Normal judgment/insight. Normal mood and affect. Oriented x3.       Visit summary with medication list and pertinent instructions was printed for patient to review, patient was advised to alert [place] if any updates are needed. All questions at time of visit were answered - patient instructed to contact office with any additional concerns. ER/RTC precautions were reviewed with the patient and understanding verbalized.   Note: Total time spent 25 minutes, greater than 50% of the visit was spent face-to-face counseling and coordinating care for the following: The primary encounter diagnosis was Postpartum depression. Diagnoses of Need for influenza vaccination, Other acne, and Lung nodule were also pertinent  to this visit.Marland Kitchen.  Please note: voice recognition software was used to produce this document, and typos may escape review. Please contact Dr. Lyn HollingsheadAlexander for any needed clarifications.    Follow up plan: Return in about 2 months (around 11/09/2019) for recheck on medications, talk about coming off Zoloft. Schedule follow-up CT fo rlung nodules .

## 2019-09-16 DIAGNOSIS — F411 Generalized anxiety disorder: Secondary | ICD-10-CM | POA: Diagnosis not present

## 2019-09-23 DIAGNOSIS — M9903 Segmental and somatic dysfunction of lumbar region: Secondary | ICD-10-CM | POA: Diagnosis not present

## 2019-09-23 DIAGNOSIS — M5414 Radiculopathy, thoracic region: Secondary | ICD-10-CM | POA: Diagnosis not present

## 2019-09-23 DIAGNOSIS — M9904 Segmental and somatic dysfunction of sacral region: Secondary | ICD-10-CM | POA: Diagnosis not present

## 2019-09-23 DIAGNOSIS — M5386 Other specified dorsopathies, lumbar region: Secondary | ICD-10-CM | POA: Diagnosis not present

## 2019-10-17 DIAGNOSIS — M9903 Segmental and somatic dysfunction of lumbar region: Secondary | ICD-10-CM | POA: Diagnosis not present

## 2019-10-17 DIAGNOSIS — M5414 Radiculopathy, thoracic region: Secondary | ICD-10-CM | POA: Diagnosis not present

## 2019-10-17 DIAGNOSIS — M5386 Other specified dorsopathies, lumbar region: Secondary | ICD-10-CM | POA: Diagnosis not present

## 2019-10-17 DIAGNOSIS — M9904 Segmental and somatic dysfunction of sacral region: Secondary | ICD-10-CM | POA: Diagnosis not present

## 2019-10-31 DIAGNOSIS — M9903 Segmental and somatic dysfunction of lumbar region: Secondary | ICD-10-CM | POA: Diagnosis not present

## 2019-10-31 DIAGNOSIS — M5386 Other specified dorsopathies, lumbar region: Secondary | ICD-10-CM | POA: Diagnosis not present

## 2019-10-31 DIAGNOSIS — M5414 Radiculopathy, thoracic region: Secondary | ICD-10-CM | POA: Diagnosis not present

## 2019-10-31 DIAGNOSIS — M9904 Segmental and somatic dysfunction of sacral region: Secondary | ICD-10-CM | POA: Diagnosis not present

## 2019-11-07 IMAGING — CT CT ABDOMEN AND PELVIS WITHOUT CONTRAST
2 of 4 series · 15 of 46 positions shown, 17 images · non-contrast
Comparison: None.

CLINICAL DATA: Postoperative complications suspected post cesarean
section.

EXAM:
CT ABDOMEN AND PELVIS WITHOUT CONTRAST
TECHNIQUE: Multidetector CT imaging of the abdomen and pelvis was performed
following the standard protocol without IV contrast.

[Series 3: abd/ pelvis 5.0 i30f 2 · axial · 0.89mm/px · z∈[+1079,+1509]mm · 12 of 94 slices shown, 14 images]
[im 4/94  soft-tissue]
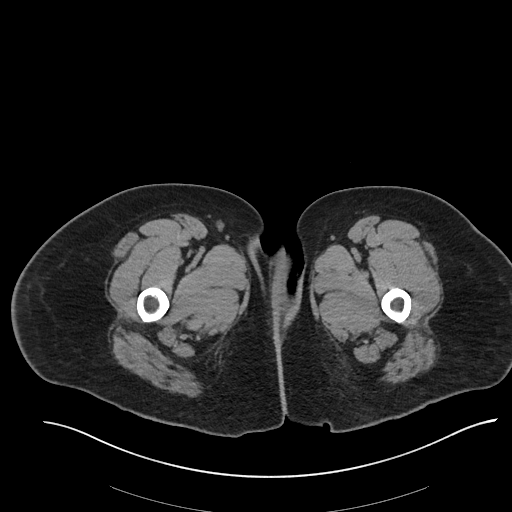
[im 4/94  bone]
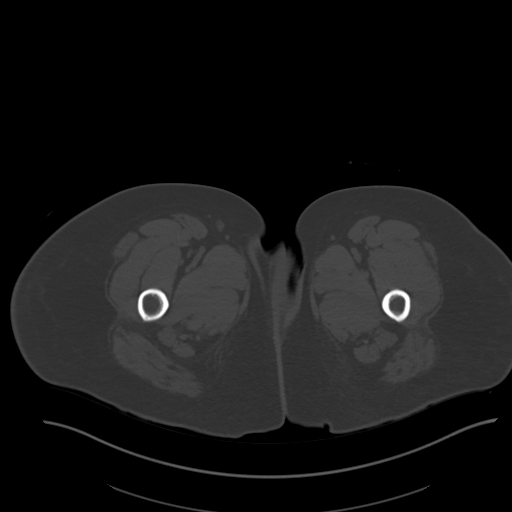
[im 12/94  soft-tissue]
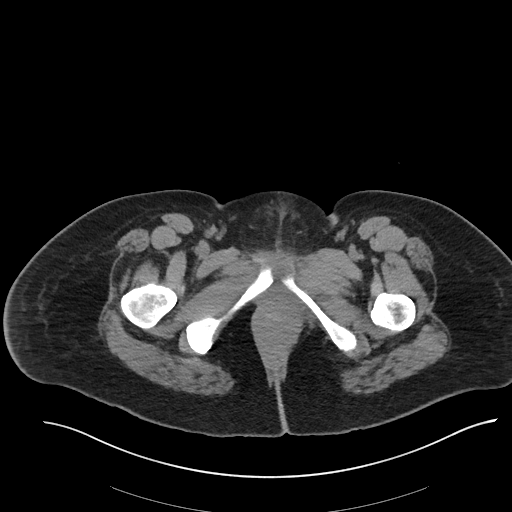
[im 20/94  soft-tissue]
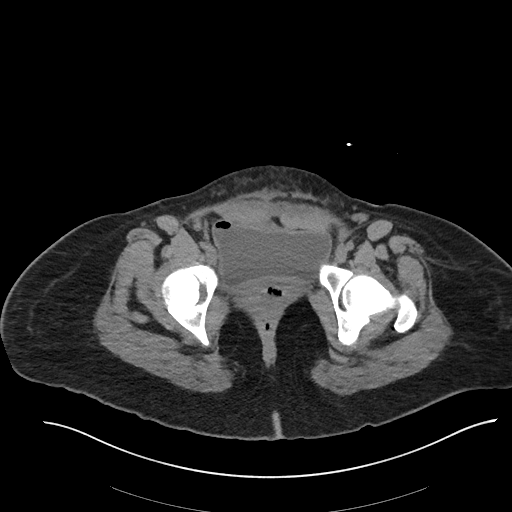
[im 28/94  soft-tissue]
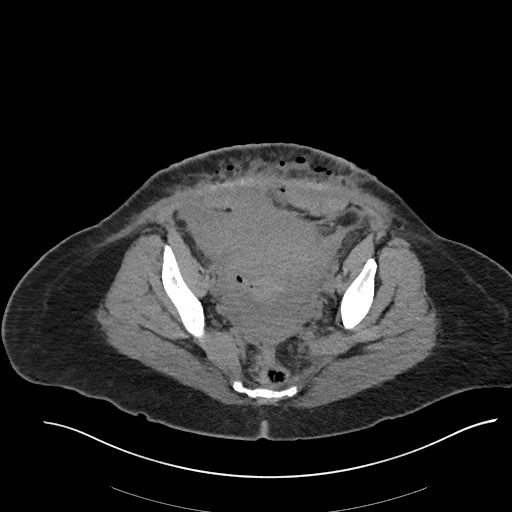
[im 35/94  soft-tissue]
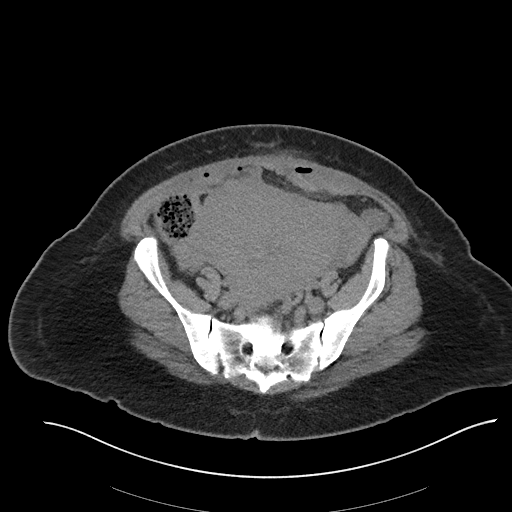
[im 43/94  soft-tissue]
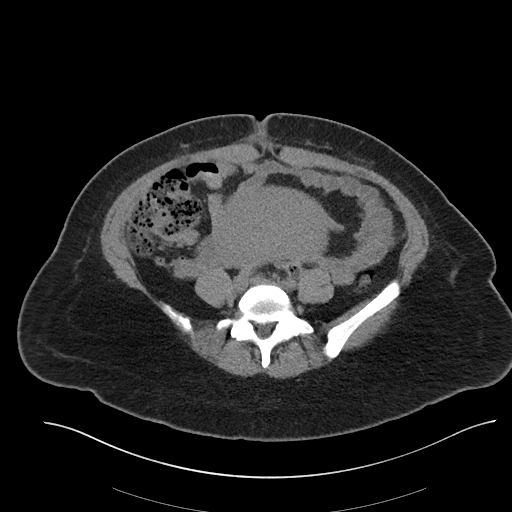
[im 51/94  soft-tissue]
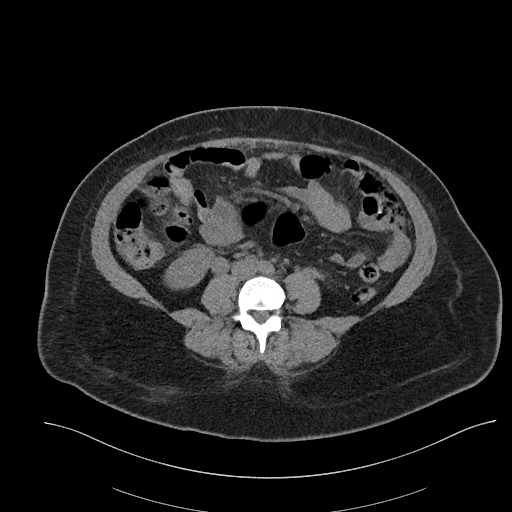
[im 59/94  soft-tissue]
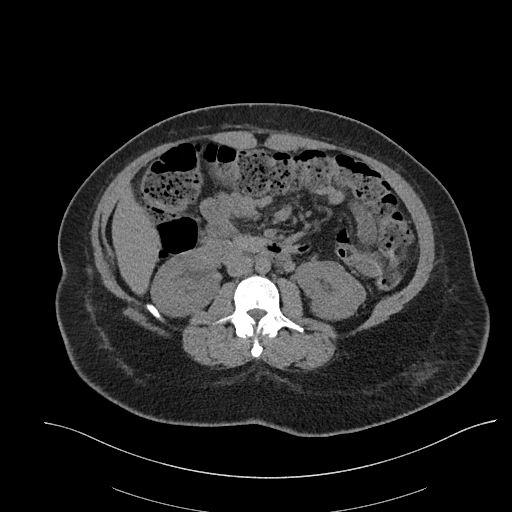
[im 66/94  soft-tissue]
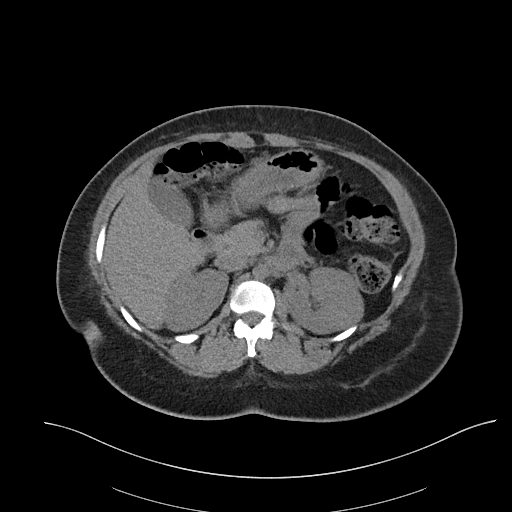
[im 66/94  bone]
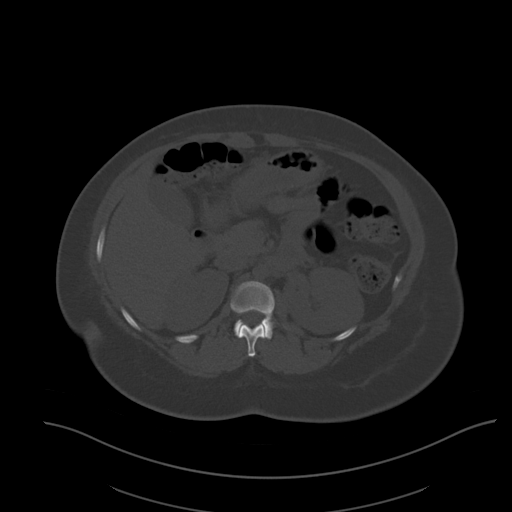
[im 74/94  soft-tissue]
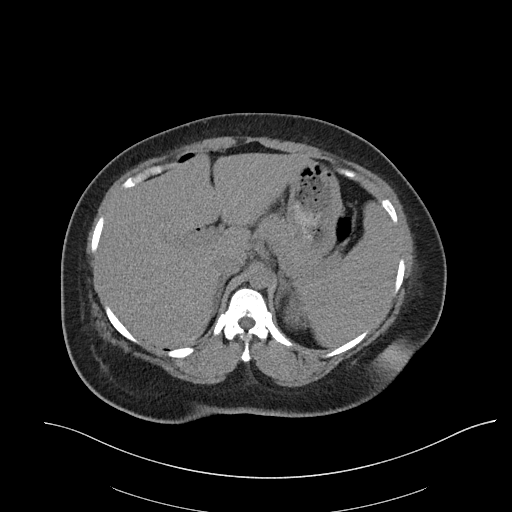
[im 82/94  soft-tissue]
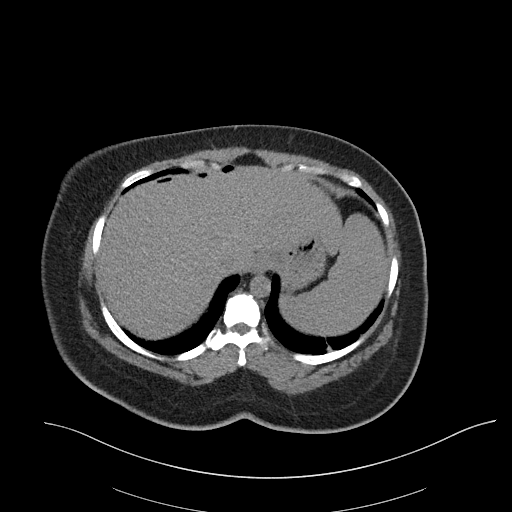
[im 90/94  soft-tissue]
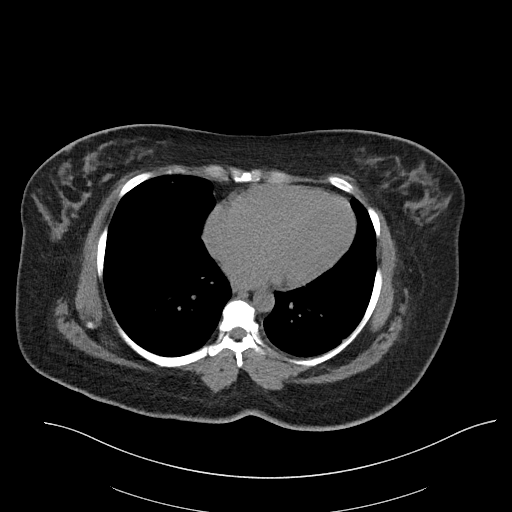

[Series 6: cor st · coronal · 0.83mm/px · 3 of 101 slices shown]
[im 34/101  soft-tissue]
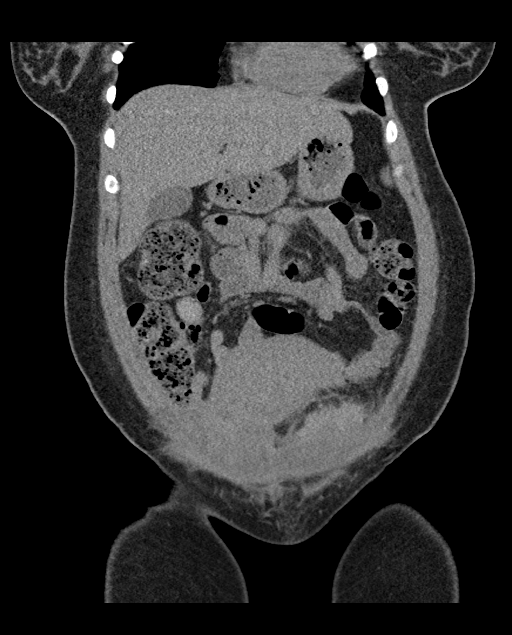
[im 45/101  soft-tissue]
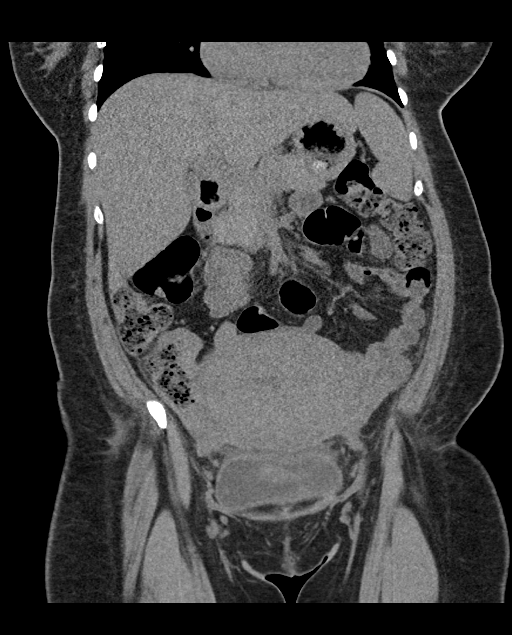
[im 56/101  soft-tissue]
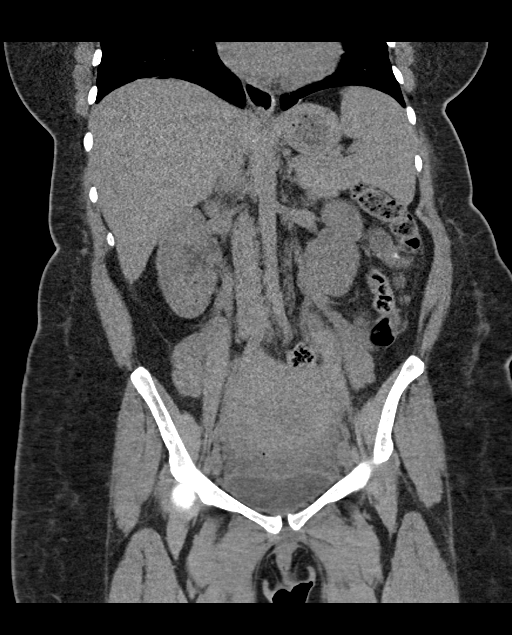

[15 of 46 positions shown; findings below may reference images not displayed]

FINDINGS: Lower chest: There is a 6 mm pulmonary nodule in the right middle
lobe (axial series 4, image 5).

Hepatobiliary: There is hepatic steatosis. The gallbladder is
unremarkable.

Pancreas: Unremarkable. No pancreatic ductal dilatation or
surrounding inflammatory changes.

Spleen: Normal in size without focal abnormality.

Adrenals/Urinary Tract: There is no hydronephrosis. There are no
radiopaque obstructing kidney stones. There is a small amount of gas
within the urinary bladder which is presumably related to recent
instrumentation.

Stomach/Bowel: There is a moderate amount of stool throughout the
colon. The appendix is normal. There is no evidence of a small-bowel
obstruction. The stomach is unremarkable.

Vascular/Lymphatic: Aortic atherosclerosis. No enlarged abdominal or
pelvic lymph nodes.

Reproductive: Seen or findings related to recent cesarean section.
The uterus is enlarged. There are multiple pockets of gas within the
endometrial canal. There is some fluid within the lower uterine
segment. There are few pockets of gas within the anterior low
abdominal wall. There is no well-formed fluid collection concerning
for an abscess. Multiple pockets of gas are noted within the rectus
sheath bilaterally. There is a small volume of pneumoperitoneum. No
large fluid collection concerning for a hematoma. There is a small
amount of hyperdense free fluid in the patient's pelvis consistent
with expected postoperative blood products.

Other: There is no abdominal wall hernia.

Musculoskeletal: No acute or significant osseous findings.
IMPRESSION: 1. Expected postsurgical changes related to recent Caesarean section
including small volume pneumoperitoneum and small volume hyperdense
free fluid in the patient's pelvis.
2. Large amount of stool throughout the colon. Normal appendix in
the right lower quadrant. No evidence of a small-bowel obstruction.
3. Gas within the urinary bladder, presumably from recent
instrumentation.
4. There is a 6 mm pulmonary nodule in the right middle lobe.
Non-contrast chest CT at 6-12 months is recommended. If the nodule
is stable at time of repeat CT, then future CT at 18-24 months (from
today's scan) is considered optional for low-risk patients, but is
recommended for high-risk patients. This recommendation follows the
consensus statement: Guidelines for Management of Incidental
Pulmonary Nodules Detected on CT Images: From the [HOSPITAL]

## 2019-11-10 ENCOUNTER — Telehealth: Payer: Self-pay | Admitting: Osteopathic Medicine

## 2019-11-10 NOTE — Telephone Encounter (Signed)
Patient is wanting blood work and a f/u CT scan of heart and lungs put in for the R lung nodules  °

## 2019-11-10 NOTE — Telephone Encounter (Signed)
Can discuss at appoinment

## 2019-11-10 NOTE — Telephone Encounter (Signed)
Patient is wanting blood work and a f/u CT scan of heart and lungs put in for the R lung nodules

## 2019-11-10 NOTE — Telephone Encounter (Signed)
Appointment tomorrow.. ok to wait and order at that time?

## 2019-11-11 ENCOUNTER — Ambulatory Visit (INDEPENDENT_AMBULATORY_CARE_PROVIDER_SITE_OTHER): Payer: BC Managed Care – PPO | Admitting: Osteopathic Medicine

## 2019-11-11 ENCOUNTER — Encounter: Payer: Self-pay | Admitting: Osteopathic Medicine

## 2019-11-11 VITALS — Wt 170.0 lb

## 2019-11-11 DIAGNOSIS — R911 Solitary pulmonary nodule: Secondary | ICD-10-CM

## 2019-11-11 DIAGNOSIS — R635 Abnormal weight gain: Secondary | ICD-10-CM | POA: Diagnosis not present

## 2019-11-11 NOTE — Progress Notes (Signed)
Virtual Visit via Phone  I connected with      Holly Jackson on 11/11/19 by a telemedicine application and verified that I am speaking with the correct person using two identifiers.  Patient is at home I am in office   I discussed the limitations of evaluation and management by telemedicine and the availability of in person appointments. The patient expressed understanding and agreed to proceed.  History of Present Illness: Holly Jackson is a 38 y.o. female who would like to discuss CT results, weight loss emdicine   Stopped the Zoloft awhile ago, she is feeling well off this medicine.   Wanted to talk about the CT results - she is very concerned about the atherosclerosis and the pulmonary nodule.   Has been on phendimetrazine in the past and would like to be on this emdicine again for weight loss.        Observations/Objective: Wt 170 lb (77.1 kg)   BMI 31.09 kg/m  BP Readings from Last 3 Encounters:  09/09/19 (!) 144/80  06/21/19 114/78  02/11/19 128/69   Exam: Normal Speech.  NAD  Lab and Radiology Results No results found for this or any previous visit (from the past 72 hour(s)). No results found.     Assessment and Plan: 38 y.o. female with The primary encounter diagnosis was Weight gain. A diagnosis of Incidental lung nodule, > 16mm and < 26mm was also pertinent to this visit.  CT repeat not yet due, reminder has been set in Epic for this  Hx GDM next time here needs A1C    PDMP not reviewed this encounter. No orders of the defined types were placed in this encounter.  Meds ordered this encounter  Medications  . Phendimetrazine Tartrate 35 MG TABS    Sig: Take 0.5-1 tablets (17.5-35 mg total) by mouth 3 (three) times daily before meals.    Dispense:  90 tablet    Refill:  0     Follow Up Instructions: Return in about 4 weeks (around 12/09/2019) for nurse visit weight and BP check .    I discussed the assessment and treatment plan with the  patient. The patient was provided an opportunity to ask questions and all were answered. The patient agreed with the plan and demonstrated an understanding of the instructions.   The patient was advised to call back or seek an in-person evaluation if any new concerns, if symptoms worsen or if the condition fails to improve as anticipated.  25 minutes of non-face-to-face time was provided during this encounter.      . . . . . . . . . . . . . Marland Kitchen                   Historical information moved to improve visibility of documentation.  Past Medical History:  Diagnosis Date  . Anxiety   . Asthma   . Cholestasis of pregnancy   . Depression   . Gestational diabetes   . IBS (irritable bowel syndrome)   . Newborn product of in vitro fertilization (IVF) pregnancy   . Vaginal Pap smear, abnormal    pos HPV  . Velamentous insertion of umbilical cord    Past Surgical History:  Procedure Laterality Date  . ABDOMINOPLASTY    . CESAREAN SECTION N/A 09/21/2014   Procedure: CESAREAN SECTION;  Surgeon: Konrad Felix, MD;  Location: [facility];  Service: Obstetrics;  Laterality: N/A;  . CESAREAN SECTION N/A 03/04/2017   Procedure:  CESAREAN SECTION;  Surgeon: Delsa Bern, MD;  Location: [facility];  Service: Obstetrics;  Laterality: N/A;  . CESAREAN SECTION N/A 06/18/2019   Procedure: Repeat CESAREAN SECTION;  Surgeon: Delsa Bern, MD;  Location: [facility];  Service: Obstetrics;  Laterality: N/A;  Donnel Saxon Assisting   Social History   Tobacco Use  . Smoking status: Former Smoker    Quit date: 2007    Years since quitting: 13.8  . Smokeless tobacco: Never Used  Substance Use Topics  . Alcohol use: No   family history includes Cerebral aneurysm in her father; Miscarriages / Stillbirths in her sister; Stroke in her father and mother.  Medications: Current Outpatient Medications  Medication Sig Dispense Refill  . acetaminophen (TYLENOL) 500 MG tablet  Take 500 mg by mouth every 6 (six) hours as needed for mild pain or moderate pain.    . clindamycin-benzoyl peroxide (BENZACLIN) gel Apply topically 2 (two) times daily. 50 g 1  . Prenatal Vit-Fe Fumarate-FA (PRENATAL MULTIVITAMIN) TABS tablet Take 1 tablet by mouth at bedtime.     . traZODone (DESYREL) 50 MG tablet Take 0.5-2 tablets (25-100 mg total) by mouth at bedtime as needed for sleep. 60 tablet 1  . Phendimetrazine Tartrate 35 MG TABS Take 0.5-1 tablets (17.5-35 mg total) by mouth 3 (three) times daily before meals. 90 tablet 0   No current facility-administered medications for this visit.    Allergies  Allergen Reactions  . Penicillins Shortness Of Breath    Has patient had a PCN reaction causing immediate rash, facial/tongue/throat swelling, SOB or lightheadedness with hypotension: Yes Has patient had a PCN reaction causing severe rash involving mucus membranes or skin necrosis: No Has patient had a PCN reaction that required hospitalization No Has patient had a PCN reaction occurring within the last 10 years: No If all of the above answers are "NO", then may proceed with Cephalosporin use.    . Sulfa Antibiotics Rash  . Sulfasalazine Rash

## 2019-11-13 MED ORDER — PHENDIMETRAZINE TARTRATE 35 MG PO TABS: 17.5000 mg | ORAL_TABLET | Freq: Three times a day (TID) | ORAL | 0 refills | Status: DC

## 2019-11-14 DIAGNOSIS — M9904 Segmental and somatic dysfunction of sacral region: Secondary | ICD-10-CM | POA: Diagnosis not present

## 2019-11-14 DIAGNOSIS — M9903 Segmental and somatic dysfunction of lumbar region: Secondary | ICD-10-CM | POA: Diagnosis not present

## 2019-11-14 DIAGNOSIS — M5386 Other specified dorsopathies, lumbar region: Secondary | ICD-10-CM | POA: Diagnosis not present

## 2019-11-14 DIAGNOSIS — M5414 Radiculopathy, thoracic region: Secondary | ICD-10-CM | POA: Diagnosis not present

## 2019-11-16 DIAGNOSIS — Z30430 Encounter for insertion of intrauterine contraceptive device: Secondary | ICD-10-CM | POA: Diagnosis not present

## 2019-11-16 DIAGNOSIS — R1031 Right lower quadrant pain: Secondary | ICD-10-CM | POA: Diagnosis not present

## 2019-11-21 DIAGNOSIS — M9904 Segmental and somatic dysfunction of sacral region: Secondary | ICD-10-CM | POA: Diagnosis not present

## 2019-11-21 DIAGNOSIS — M5386 Other specified dorsopathies, lumbar region: Secondary | ICD-10-CM | POA: Diagnosis not present

## 2019-11-21 DIAGNOSIS — M5414 Radiculopathy, thoracic region: Secondary | ICD-10-CM | POA: Diagnosis not present

## 2019-11-21 DIAGNOSIS — M9903 Segmental and somatic dysfunction of lumbar region: Secondary | ICD-10-CM | POA: Diagnosis not present

## 2019-11-25 ENCOUNTER — Telehealth (INDEPENDENT_AMBULATORY_CARE_PROVIDER_SITE_OTHER): Payer: BC Managed Care – PPO

## 2019-11-25 ENCOUNTER — Encounter: Payer: Self-pay | Admitting: Osteopathic Medicine

## 2019-11-25 DIAGNOSIS — Z008 Encounter for other general examination: Secondary | ICD-10-CM

## 2019-11-25 DIAGNOSIS — R918 Other nonspecific abnormal finding of lung field: Secondary | ICD-10-CM

## 2019-11-25 NOTE — Telephone Encounter (Signed)
If patient cannot be specific about the medication she wants, I am not sure how to help her.   I do not see Bentyl on her lis previous medications in our system.  She has multiple GI medications on her list here.  She should contact her pharmacy and ask for refills  11 minutes total spent

## 2019-11-25 NOTE — Telephone Encounter (Signed)
I had advised the patient at her previous encounter that insurance is not going to pay for a repeat CT scan prior to the 6 months from her last one - this scan was done 06/19/2019, so a repeat would not be due until on or after 12/19/2019.    I have placed the order for the repeat, she does not hear back about scheduling it in the next few days she should call the imaging department at [phone] but if she schedules it sooner than she is due and she gets charged for this, that is out of my hands.

## 2019-11-25 NOTE — Telephone Encounter (Signed)
I have spoke with the patient and she is aware that she would have to wait until the allotted time that is 6 months out for scheduling for a CT scan. She did not have any questions and is aware we are checking with insurance to make sure no authorization is needed.   She also states that she was on an IBS medication in the past and is not sure the name. The only one I could find in care everywhere is Bentyl. I do not see that you have prescribed this for her but she is wanting you to send something into [facility]. Please advise.

## 2019-11-25 NOTE — Telephone Encounter (Signed)
Pt called requesting an update on X-ray referral. As per pt, numerous requests was made for referral, needs to check her heart and other issues. She informed provider at virtual visit why she was requesting the referral. Pt states that she is frustrated that referral is taking long to initiate. Pt mentioned that she has a form regarding insurance coverage that will need completion from provider. Pt does not want to come into the office. She will sign up for MyChart to upload forms for provider. Link address texted to pt for sign up. Pt also stated she is currently having right sided pelvic pain. She has an appt scheduled with her OB/Gyn on 11/27/19. She wanted provider to be made aware of her current symptoms. No other inquiries during call.

## 2019-11-26 MED ORDER — DICYCLOMINE HCL 20 MG PO TABS: 20.0000 mg | ORAL_TABLET | Freq: Three times a day (TID) | ORAL | 3 refills | Status: DC | PRN

## 2019-11-26 NOTE — Telephone Encounter (Signed)
Pt has been updated of provider's note. Pt can not recall specific name for IBS med that she took in the past. She is okay with any recommendation provider may have for an IBS rx. If appropriate, pls send rx to pharmacy on file.

## 2019-11-26 NOTE — Telephone Encounter (Signed)
Pt has been updated of IBS rx. She is aware of provider's note/recommendation and will f/u prn. No other inquiries during call.

## 2019-11-26 NOTE — Addendum Note (Signed)
Addended by: Maryla Morrow on: 11/26/2019 03:04 PM   Modules accepted: Orders

## 2019-11-26 NOTE — Telephone Encounter (Signed)
Sent Bentyl, if this is not helpful, patient will need to schedule a visit to talk about other options

## 2019-11-27 DIAGNOSIS — Z131 Encounter for screening for diabetes mellitus: Secondary | ICD-10-CM | POA: Diagnosis not present

## 2019-11-27 DIAGNOSIS — Z13220 Encounter for screening for lipoid disorders: Secondary | ICD-10-CM | POA: Diagnosis not present

## 2019-11-27 DIAGNOSIS — R1031 Right lower quadrant pain: Secondary | ICD-10-CM | POA: Diagnosis not present

## 2019-11-27 DIAGNOSIS — E559 Vitamin D deficiency, unspecified: Secondary | ICD-10-CM | POA: Diagnosis not present

## 2019-11-27 DIAGNOSIS — Z30431 Encounter for routine checking of intrauterine contraceptive device: Secondary | ICD-10-CM | POA: Diagnosis not present

## 2019-11-27 DIAGNOSIS — Z0000 Encounter for general adult medical examination without abnormal findings: Secondary | ICD-10-CM | POA: Diagnosis not present

## 2019-11-27 NOTE — Telephone Encounter (Signed)
You do not have a cholesterol panel on file within the past year -need these results to complete the form. I have placed orders for you to come to the lab, once we have those results I can print these and attach to the form and have it up front for you to pick up. Please come to lab when you are able!

## 2019-11-28 DIAGNOSIS — M9904 Segmental and somatic dysfunction of sacral region: Secondary | ICD-10-CM | POA: Diagnosis not present

## 2019-11-28 DIAGNOSIS — M9903 Segmental and somatic dysfunction of lumbar region: Secondary | ICD-10-CM | POA: Diagnosis not present

## 2019-11-28 DIAGNOSIS — M5414 Radiculopathy, thoracic region: Secondary | ICD-10-CM | POA: Diagnosis not present

## 2019-11-28 DIAGNOSIS — M5386 Other specified dorsopathies, lumbar region: Secondary | ICD-10-CM | POA: Diagnosis not present

## 2019-12-01 DIAGNOSIS — R102 Pelvic and perineal pain: Secondary | ICD-10-CM | POA: Diagnosis not present

## 2019-12-01 DIAGNOSIS — E559 Vitamin D deficiency, unspecified: Secondary | ICD-10-CM | POA: Diagnosis not present

## 2019-12-01 DIAGNOSIS — Z30431 Encounter for routine checking of intrauterine contraceptive device: Secondary | ICD-10-CM | POA: Diagnosis not present

## 2019-12-04 ENCOUNTER — Encounter: Payer: Self-pay | Admitting: Osteopathic Medicine

## 2019-12-04 MED ORDER — ALBUTEROL SULFATE HFA 108 (90 BASE) MCG/ACT IN AERS: 2.0000 | INHALATION_SPRAY | Freq: Four times a day (QID) | RESPIRATORY_TRACT | 11 refills | Status: AC | PRN

## 2019-12-05 DIAGNOSIS — M9904 Segmental and somatic dysfunction of sacral region: Secondary | ICD-10-CM | POA: Diagnosis not present

## 2019-12-05 DIAGNOSIS — M9903 Segmental and somatic dysfunction of lumbar region: Secondary | ICD-10-CM | POA: Diagnosis not present

## 2019-12-05 DIAGNOSIS — M5386 Other specified dorsopathies, lumbar region: Secondary | ICD-10-CM | POA: Diagnosis not present

## 2019-12-05 DIAGNOSIS — M5414 Radiculopathy, thoracic region: Secondary | ICD-10-CM | POA: Diagnosis not present

## 2019-12-15 ENCOUNTER — Encounter: Payer: Self-pay | Admitting: Osteopathic Medicine

## 2019-12-16 ENCOUNTER — Ambulatory Visit (INDEPENDENT_AMBULATORY_CARE_PROVIDER_SITE_OTHER): Payer: BC Managed Care – PPO | Admitting: Sports Medicine

## 2019-12-16 ENCOUNTER — Encounter: Payer: Self-pay | Admitting: Sports Medicine

## 2019-12-16 ENCOUNTER — Ambulatory Visit (INDEPENDENT_AMBULATORY_CARE_PROVIDER_SITE_OTHER): Payer: BC Managed Care – PPO

## 2019-12-16 ENCOUNTER — Other Ambulatory Visit: Payer: Self-pay

## 2019-12-16 DIAGNOSIS — M5136 Other intervertebral disc degeneration, lumbar region: Secondary | ICD-10-CM

## 2019-12-16 DIAGNOSIS — M545 Low back pain: Secondary | ICD-10-CM | POA: Diagnosis not present

## 2019-12-16 DIAGNOSIS — M51369 Other intervertebral disc degeneration, lumbar region without mention of lumbar back pain or lower extremity pain: Secondary | ICD-10-CM | POA: Insufficient documentation

## 2019-12-16 MED ORDER — MELOXICAM 15 MG PO TABS: ORAL_TABLET | ORAL | 3 refills | Status: DC

## 2019-12-16 NOTE — Progress Notes (Addendum)
Subjective:    CC: Low back pain  HPI: This is a very pleasant 38 year old female, for several months she is had pain in her low back, axial, worse with sitting, flexion, Valsalva, moderate, persistent, localized without radiation.  Nothing radicular.  I reviewed the past medical history, family history, social history, surgical history, and allergies today and no changes were needed.  Please see the problem list section below in epic for further details.  Past Medical History: Past Medical History:  Diagnosis Date  . Anxiety   . Asthma   . Cholestasis of pregnancy   . Depression   . Gestational diabetes   . IBS (irritable bowel syndrome)   . Newborn product of in vitro fertilization (IVF) pregnancy   . Vaginal Pap smear, abnormal    pos HPV  . Velamentous insertion of umbilical cord    Past Surgical History: Past Surgical History:  Procedure Laterality Date  . ABDOMINOPLASTY    . CESAREAN SECTION N/A 09/21/2014   Procedure: CESAREAN SECTION;  Surgeon: Alinda Dooms, MD;  Location: [facility];  Service: Obstetrics;  Laterality: N/A;  . CESAREAN SECTION N/A 03/04/2017   Procedure: CESAREAN SECTION;  Surgeon: Delsa Bern, MD;  Location: [facility];  Service: Obstetrics;  Laterality: N/A;  . CESAREAN SECTION N/A 06/18/2019   Procedure: Repeat CESAREAN SECTION;  Surgeon: Delsa Bern, MD;  Location: [facility];  Service: Obstetrics;  Laterality: N/A;  Donnel Saxon Assisting   Social History: Social History   Socioeconomic History  . Marital status: Married    Spouse name: Not on file  . Number of children: 2  . Years of education: Not on file  . Highest education level: Not on file  Occupational History  . Occupation: RISK ANALYST    Employer: [employer]  Tobacco Use  . Smoking status: Former Smoker    Quit date: 2007    Years since quitting: 14.0  . Smokeless tobacco: Never Used  Substance and Sexual Activity  . Alcohol use: No  . Drug use: No  . Sexual  activity: Yes  Other Topics Concern  . Not on file  Social History Narrative  . Not on file   Social Determinants of Health   Financial Resource Strain: Low Risk   . Difficulty of Paying Living Expenses: Not hard at all  Food Insecurity: No Food Insecurity  . Worried About Charity fundraiser in the Last Year: Never true  . Ran Out of Food in the Last Year: Never true  Transportation Needs: Unknown  . Lack of Transportation (Medical): No  . Lack of Transportation (Non-Medical): Not on file  Physical Activity:   . Days of Exercise per Week: Not on file  . Minutes of Exercise per Session: Not on file  Stress: No Stress Concern Present  . Feeling of Stress : Not at all  Social Connections:   . Frequency of Communication with Friends and Family: Not on file  . Frequency of Social Gatherings with Friends and Family: Not on file  . Attends Religious Services: Not on file  . Active Member of Clubs or Organizations: Not on file  . Attends Archivist Meetings: Not on file  . Marital Status: Not on file   Family History: Family History  Problem Relation Age of Onset  . Miscarriages / Stillbirths Sister   . Stroke Mother   . Stroke Father   . Cerebral aneurysm Father   . Allergic rhinitis Neg Hx   .  Angioedema Neg Hx   . Asthma Neg Hx   . Eczema Neg Hx   . Immunodeficiency Neg Hx   . Urticaria Neg Hx    Allergies: Allergies  Allergen Reactions  . Penicillins Shortness Of Breath    Has patient had a PCN reaction causing immediate rash, facial/tongue/throat swelling, SOB or lightheadedness with hypotension: Yes Has patient had a PCN reaction causing severe rash involving mucus membranes or skin necrosis: No Has patient had a PCN reaction that required hospitalization No Has patient had a PCN reaction occurring within the last 10 years: No If all of the above answers are "NO", then may proceed with Cephalosporin use.    . Sulfa Antibiotics Rash  . Sulfasalazine Rash    Medications: See med rec.  Review of Systems: No fevers, chills, night sweats, weight loss, chest pain, or shortness of breath.   Objective:    General: Well Developed, well nourished, and in no acute distress.  Neuro: Alert and oriented x3, extra-ocular muscles intact, sensation grossly intact.  HEENT: Normocephalic, atraumatic, pupils equal round reactive to light, neck supple, no masses, no lymphadenopathy, thyroid nonpalpable.  Skin: Warm and dry, no rashes. Cardiac: Regular rate and rhythm, no murmurs rubs or gallops, no lower extremity edema.  Respiratory: Clear to auscultation bilaterally. Not using accessory muscles, speaking in full sentences. Back Exam:  Inspection: Unremarkable  Motion: Flexion 45 deg, Extension 45 deg, Side Bending to 45 deg bilaterally,  Rotation to 45 deg bilaterally  SLR laying: Negative  XSLR laying: Negative  Palpable tenderness: None. FABER: negative. Sensory change: Gross sensation intact to all lumbar and sacral dermatomes.  Reflexes: 2+ at both patellar tendons, 2+ at achilles tendons, Babinski's downgoing.  Strength at foot  Plantar-flexion: 5/5 Dorsi-flexion: 5/5 Eversion: 5/5 Inversion: 5/5  Leg strength  Quad: 5/5 Hamstring: 5/5 Hip flexor: 5/5 Hip abductors: 5/5  Gait unremarkable.  L4-L5 and L5-S1 DDD seen on abdominal CT scan.  Impression and Recommendations:    Lumbar degenerative disc disease L4-5 and L5-S1 DDD, symptoms of this are fairly classic. Adding meloxicam, x-rays, formal physical therapy, return to see me in 6 weeks, MR for interventional planning if no better.  MRI confirms disc protrusions, likely symptomatic levels L4-L5, proceeding with L4-L5 interlaminar epidural. Return to see me 1 month after epidural.   ___________________________________________ Ihor Austin. Benjamin Stain, M.D., ABFM., CAQSM. Primary Care and Sports Medicine [facility]  Adjunct Professor of Family Medicine    [employer]

## 2019-12-16 NOTE — Assessment & Plan Note (Addendum)
L4-5 and L5-S1 DDD, symptoms of this are fairly classic. Adding meloxicam, x-rays, formal physical therapy, return to see me in 6 weeks, MR for interventional planning if no better.  MRI confirms disc protrusions, likely symptomatic levels L4-L5, proceeding with L4-L5 interlaminar epidural. Return to see me 1 month after epidural.

## 2019-12-17 DIAGNOSIS — M51369 Other intervertebral disc degeneration, lumbar region without mention of lumbar back pain or lower extremity pain: Secondary | ICD-10-CM

## 2019-12-17 DIAGNOSIS — M5136 Other intervertebral disc degeneration, lumbar region: Secondary | ICD-10-CM

## 2019-12-21 ENCOUNTER — Ambulatory Visit (INDEPENDENT_AMBULATORY_CARE_PROVIDER_SITE_OTHER): Payer: BC Managed Care – PPO

## 2019-12-21 ENCOUNTER — Encounter: Payer: Self-pay | Admitting: Osteopathic Medicine

## 2019-12-21 ENCOUNTER — Other Ambulatory Visit: Payer: Self-pay

## 2019-12-21 DIAGNOSIS — R918 Other nonspecific abnormal finding of lung field: Secondary | ICD-10-CM | POA: Diagnosis not present

## 2019-12-21 DIAGNOSIS — J8410 Pulmonary fibrosis, unspecified: Secondary | ICD-10-CM | POA: Diagnosis not present

## 2019-12-21 DIAGNOSIS — M545 Low back pain: Secondary | ICD-10-CM | POA: Diagnosis not present

## 2019-12-21 DIAGNOSIS — Z8659 Personal history of other mental and behavioral disorders: Secondary | ICD-10-CM

## 2019-12-21 DIAGNOSIS — M5136 Other intervertebral disc degeneration, lumbar region: Secondary | ICD-10-CM

## 2019-12-21 DIAGNOSIS — R5383 Other fatigue: Secondary | ICD-10-CM

## 2019-12-21 NOTE — Assessment & Plan Note (Signed)
L4-L5 and L5-S1 DDD, classic discogenic pain, she did not respond to meloxicam, x-rays, formal physical therapy, she has had months of symptoms. We are going to go ahead and proceed with an MRI for interventional planning.

## 2019-12-21 NOTE — Telephone Encounter (Signed)
MRI ordered

## 2019-12-21 NOTE — Telephone Encounter (Signed)
pended

## 2019-12-22 NOTE — Addendum Note (Signed)
Addended by: Silverio Decamp on: 12/22/2019 09:36 AM   Modules accepted: Orders

## 2019-12-23 ENCOUNTER — Encounter: Payer: Self-pay | Admitting: Osteopathic Medicine

## 2019-12-23 ENCOUNTER — Ambulatory Visit (INDEPENDENT_AMBULATORY_CARE_PROVIDER_SITE_OTHER): Payer: BC Managed Care – PPO | Admitting: Osteopathic Medicine

## 2019-12-23 ENCOUNTER — Ambulatory Visit: Payer: BC Managed Care – PPO | Admitting: Physical Therapy

## 2019-12-23 VITALS — Wt 185.0 lb

## 2019-12-23 DIAGNOSIS — R5382 Chronic fatigue, unspecified: Secondary | ICD-10-CM | POA: Diagnosis not present

## 2019-12-23 NOTE — Progress Notes (Signed)
Called pt at 858 am and 905 am, phone rings then goes to busy tone. Unable to leave a vm msg.

## 2019-12-23 NOTE — Progress Notes (Signed)
Virtual Visit via Video (App used: doximity) Note  I connected with      Holly Jackson on 12/23/19 at 9:19 AM  by a telemedicine application and verified that I am speaking with the correct person using two identifiers.  Patient is at work I am in office   I discussed the limitations of evaluation and management by telemedicine and the availability of in person appointments. The patient expressed understanding and agreed to proceed.  History of Present Illness: Holly Jackson is a 38 y.o. female who would like to discuss fatigue, CT results    Fatigue: pt recently reported fatigue via phone message requesting lab work. I advised visit to discuss this further prior to ordering labs. Feels like no matter how much sleep she gets, she is always tired. Trouble sleeping, but Trazodone has been helping. IUD placed recently, not sure if that's related, Mirena, which is what she had previously. No exercise. Baby in June. No difficulty w/ CP/SOB on exertion.      Observations/Objective: Wt 185 lb (83.9 kg)   LMP 11/23/2019   BMI 33.84 kg/m  BP Readings from Last 3 Encounters:  12/16/19 140/80  09/09/19 (!) 144/80  06/21/19 114/78   Exam: Normal Speech.  NAD  Lab and Radiology Results No results found for this or any previous visit (from the past 72 hour(s)). CT CHEST WO CONTRAST  Result Date: 12/21/2019 CLINICAL DATA:  Pulmonary nodule follow-up. EXAM: CT CHEST WITHOUT CONTRAST TECHNIQUE: Multidetector CT imaging of the chest was performed following the standard protocol without IV contrast. COMPARISON:  CT dated June 19, 2019. FINDINGS: Cardiovascular: The heart size is normal. There are no significant atherosclerotic changes of the thoracic aorta. There is no aneurysm. There is no significant pericardial effusion. Mediastinum/Nodes: --No mediastinal or hilar lymphadenopathy. --No axillary lymphadenopathy. --No supraclavicular lymphadenopathy. --Normal thyroid gland. --The esophagus is  unremarkable Lungs/Pleura: There is a calcified granuloma in the right upper lobe (axial series 3, image 49). There is a stable 6 mm pulmonary nodule in the right middle lobe (axial series 3, image 74). This pulmonary nodules essentially stable since March 24, 2018. there is no pneumothorax. No large pleural effusion. Upper Abdomen: No acute abnormality. Musculoskeletal: No chest wall abnormality. No acute or significant osseous findings. Review of the MIP images confirms the above findings. IMPRESSION: 1. No acute abnormality. 2. Stable 6 mm pulmonary nodule in the right middle lobe. The patient's prior CT from 03/24/2018 is now available for review, demonstrating continue stability since 2019. This nodule has now demonstrated approximately 20 months of stability. A 1 year follow-up CT is recommended to document a full 2+ years of stability of this nodule. Electronically Signed   By: Constance Holster M.D.   On: 12/21/2019 16:03   MR LUMBAR SPINE WO CONTRAST  Result Date: 12/21/2019 CLINICAL DATA:  Low back pain EXAM: MRI LUMBAR SPINE WITHOUT CONTRAST TECHNIQUE: Multiplanar, multisequence MR imaging of the lumbar spine was performed. No intravenous contrast was administered. COMPARISON:  Lumbar radiographs 12/16/2019 FINDINGS: Segmentation: Normal segmentation. L5 is a transitional vertebra. View prior radiographs reveal small ribs at T12. Lowest disc space L5-S1. Alignment:  Slight retrolisthesis L4-5 otherwise normal alignment Vertebrae:  Normal bone marrow.  Negative for fracture or mass. Conus medullaris and cauda equina: Conus extends to the L1-2 level. Conus and cauda equina appear normal. Paraspinal and other soft tissues: Negative for paraspinous mass or adenopathy. No soft tissue edema. Disc levels: T12-L1: Negative L1-2: Negative L2-3: Negative L3-4: Negative L4-5:  Small central disc protrusion. No significant spinal stenosis or neural impingement. L5-S1: Negative IMPRESSION: L5 is a transitional  vertebra.  Lowest disc space is L5-S1 Small central disc protrusion L4-5 without neural impingement. Otherwise normal. Electronically Signed   By: Marlan Palau M.D.   On: 12/21/2019 13:35       Assessment and Plan: 38 y.o. female with The encounter diagnosis was Chronic fatigue.  Pt will get [place] labs done by her OBGYN, may adjust orders as below  Pending lab results, I advised increase exercise to improve endurance, sleep  Reviewed CT results, all questions answered.    PDMP not reviewed this encounter. Orders Placed This Encounter  Procedures  . CBC W/ DIFF.  Marland Kitchen COMPLETE METABOLIC PANEL WITH GFR  . TSH  . T4, FREE  . A1C  . VITAMIN D 25     Follow Up Instructions: Return for RECHECK FATIGUE PENDING LAB RESULTS / IF WORSE OR CHANGE.    I discussed the assessment and treatment plan with the patient. The patient was provided an opportunity to ask questions and all were answered. The patient agreed with the plan and demonstrated an understanding of the instructions.   The patient was advised to call back or seek an in-person evaluation if any new concerns, if symptoms worsen or if the condition fails to improve as anticipated.  25 minutes of non-face-to-face time was provided during this encounter.      . . . . . . . . . . . . . Marland Kitchen                   Historical information moved to improve visibility of documentation.  Past Medical History:  Diagnosis Date  . Anxiety   . Asthma   . Cholestasis of pregnancy   . Depression   . Gestational diabetes   . IBS (irritable bowel syndrome)   . Newborn product of in vitro fertilization (IVF) pregnancy   . Vaginal Pap smear, abnormal    pos HPV  . Velamentous insertion of umbilical cord    Past Surgical History:  Procedure Laterality Date  . ABDOMINOPLASTY    . CESAREAN SECTION N/A 09/21/2014   Procedure: CESAREAN SECTION;  Surgeon: Konrad Felix, MD;  Location: [facility];  Service: Obstetrics;   Laterality: N/A;  . CESAREAN SECTION N/A 03/04/2017   Procedure: CESAREAN SECTION;  Surgeon: Silverio Lay, MD;  [facility]: [facility];  Service: Obstetrics;  Laterality: N/A;  . CESAREAN SECTION N/A 06/18/2019   Procedure: Repeat CESAREAN SECTION;  Surgeon: Silverio Lay, MD;  Location: [facility];  Service: Obstetrics;  Laterality: N/A;  Nigel Bridgeman Assisting   Social History   Tobacco Use  . Smoking status: Former Smoker    Quit date: 2007    Years since quitting: 14.0  . Smokeless tobacco: Never Used  Substance Use Topics  . Alcohol use: No   family history includes Cerebral aneurysm in her father; Miscarriages / Stillbirths in her sister; Stroke in her father and mother.  Medications: Current Outpatient Medications  Medication Sig Dispense Refill  . acetaminophen (TYLENOL) 500 MG tablet Take 500 mg by mouth every 6 (six) hours as needed for mild pain or moderate pain.    Marland Kitchen albuterol (VENTOLIN HFA) 108 (90 Base) MCG/ACT inhaler Inhale 2 puffs into the lungs every 6 (six) hours as needed for wheezing. 18 g 11  . clindamycin-benzoyl peroxide (BENZACLIN) gel Apply topically 2 (two) times daily. 50 g 1  .  dicyclomine (BENTYL) 20 MG tablet Take 1 tablet (20 mg total) by mouth 3 (three) times daily as needed for spasms (abdominal pain). 90 tablet 3  . meloxicam (MOBIC) 15 MG tablet One tab PO qAM with a meal for 2 weeks, then daily prn pain. 30 tablet 3  . Phendimetrazine Tartrate 35 MG TABS Take 0.5-1 tablets (17.5-35 mg total) by mouth 3 (three) times daily before meals. 90 tablet 0  . Prenatal Vit-Fe Fumarate-FA (PRENATAL MULTIVITAMIN) TABS tablet Take 1 tablet by mouth at bedtime.     . traZODone (DESYREL) 50 MG tablet Take 0.5-2 tablets (25-100 mg total) by mouth at bedtime as needed for sleep. 60 tablet 1   No current facility-administered medications for this visit.   Allergies  Allergen Reactions  . Penicillins Shortness Of Breath    Has patient had a PCN reaction  causing immediate rash, facial/tongue/throat swelling, SOB or lightheadedness with hypotension: Yes Has patient had a PCN reaction causing severe rash involving mucus membranes or skin necrosis: No Has patient had a PCN reaction that required hospitalization No Has patient had a PCN reaction occurring within the last 10 years: No If all of the above answers are "NO", then may proceed with Cephalosporin use.    . Sulfa Antibiotics Rash  . Sulfasalazine Rash

## 2019-12-24 ENCOUNTER — Ambulatory Visit
Admission: RE | Admit: 2019-12-24 | Discharge: 2019-12-24 | Disposition: A | Discharge status: Home / Self Care | Payer: BC Managed Care – PPO | Source: Ambulatory Visit | Attending: Sports Medicine | Admitting: Sports Medicine

## 2019-12-24 ENCOUNTER — Other Ambulatory Visit: Payer: Self-pay

## 2019-12-24 ENCOUNTER — Other Ambulatory Visit: Payer: Self-pay | Admitting: Physician Assistant

## 2019-12-24 ENCOUNTER — Encounter: Payer: Self-pay | Admitting: Osteopathic Medicine

## 2019-12-24 DIAGNOSIS — R5382 Chronic fatigue, unspecified: Secondary | ICD-10-CM

## 2019-12-24 DIAGNOSIS — M5126 Other intervertebral disc displacement, lumbar region: Secondary | ICD-10-CM | POA: Diagnosis not present

## 2019-12-24 DIAGNOSIS — M5136 Other intervertebral disc degeneration, lumbar region: Secondary | ICD-10-CM

## 2019-12-24 MED ORDER — IOPAMIDOL (ISOVUE-M 200) INJECTION 41%
1.0000 mL | Freq: Once | INTRAMUSCULAR | Status: AC
  Administered 2019-12-24: 1 mL via EPIDURAL

## 2019-12-24 MED ORDER — METHYLPREDNISOLONE ACETATE 40 MG/ML INJ SUSP (RADIOLOG
120.0000 mg | Freq: Once | INTRAMUSCULAR | Status: AC
  Administered 2019-12-24: 120 mg via EPIDURAL

## 2019-12-24 MED ORDER — METHYLPREDNISOLONE ACETATE 40 MG/ML INJ SUSP (RADIOLOG: 120.0000 mg | Freq: Once | INTRAMUSCULAR | Status: DC

## 2019-12-24 MED ORDER — IOPAMIDOL (ISOVUE-M 200) INJECTION 41%: 1.0000 mL | Freq: Once | INTRAMUSCULAR | Status: DC

## 2019-12-24 NOTE — Progress Notes (Signed)
I have cancelled previous orders per patient request. I have entered an iron for this patient.

## 2019-12-24 NOTE — Discharge Instructions (Signed)

## 2019-12-25 LAB — CBC WITH DIFFERENTIAL/PLATELET
Absolute Monocytes: 456 cells/uL (ref 200–950)
Basophils Absolute: 30 cells/uL (ref 0–200)
Basophils Relative: 0.5 %
Eosinophils Absolute: 132 cells/uL (ref 15–500)
Eosinophils Relative: 2.2 %
HCT: 37.2 % (ref 35.0–45.0)
Hemoglobin: 12.1 g/dL (ref 11.7–15.5)
Lymphs Abs: 1806 cells/uL (ref 850–3900)
MCH: 25.9 pg — ABNORMAL LOW (ref 27.0–33.0)
MCHC: 32.5 g/dL (ref 32.0–36.0)
MCV: 79.7 fL — ABNORMAL LOW (ref 80.0–100.0)
MPV: 9.7 fL (ref 7.5–12.5)
Monocytes Relative: 7.6 %
Neutro Abs: 3576 cells/uL (ref 1500–7800)
Neutrophils Relative %: 59.6 %
Platelets: 300 10*3/uL (ref 140–400)
RBC: 4.67 10*6/uL (ref 3.80–5.10)
RDW: 12.9 % (ref 11.0–15.0)
Total Lymphocyte: 30.1 %
WBC: 6 10*3/uL (ref 3.8–10.8)

## 2019-12-25 LAB — COMPLETE METABOLIC PANEL WITH GFR
AG Ratio: 1.6 (calc) (ref 1.0–2.5)
ALT: 12 U/L (ref 6–29)
AST: 14 U/L (ref 10–30)
Albumin: 4.6 g/dL (ref 3.6–5.1)
Alkaline phosphatase (APISO): 71 U/L (ref 31–125)
BUN: 15 mg/dL (ref 7–25)
CO2: 26 mmol/L (ref 20–32)
Calcium: 9.6 mg/dL (ref 8.6–10.2)
Chloride: 104 mmol/L (ref 98–110)
Creat: 0.57 mg/dL (ref 0.50–1.10)
GFR, Est African American: 136 mL/min/{1.73_m2} (ref >=60)
GFR, Est Non African American: 118 mL/min/{1.73_m2} (ref >=60)
Globulin: 2.8 g/dL (calc) (ref 1.9–3.7)
Glucose, Bld: 97 mg/dL (ref 65–99)
Potassium: 4.2 mmol/L (ref 3.5–5.3)
Sodium: 139 mmol/L (ref 135–146)
Total Bilirubin: 0.3 mg/dL (ref 0.2–1.2)
Total Protein: 7.4 g/dL (ref 6.1–8.1)

## 2020-01-18 ENCOUNTER — Encounter: Payer: Self-pay | Admitting: Osteopathic Medicine

## 2020-01-27 ENCOUNTER — Ambulatory Visit: Payer: BC Managed Care – PPO | Admitting: Sports Medicine

## 2020-02-10 ENCOUNTER — Other Ambulatory Visit: Payer: Self-pay | Admitting: Osteopathic Medicine

## 2020-02-10 MED ORDER — PHENDIMETRAZINE TARTRATE 35 MG PO TABS: 17.5000 mg | ORAL_TABLET | Freq: Three times a day (TID) | ORAL | 0 refills | Status: DC

## 2020-02-10 NOTE — Telephone Encounter (Signed)
[facility] pharmacy requesting med refill for phendimetrazine.

## 2020-06-10 ENCOUNTER — Other Ambulatory Visit: Payer: Self-pay | Admitting: Osteopathic Medicine

## 2020-08-26 DIAGNOSIS — Z20828 Contact with and (suspected) exposure to other viral communicable diseases: Secondary | ICD-10-CM | POA: Diagnosis not present

## 2020-09-26 ENCOUNTER — Encounter: Payer: Self-pay | Admitting: Osteopathic Medicine

## 2020-09-26 ENCOUNTER — Other Ambulatory Visit: Payer: Self-pay

## 2020-09-26 ENCOUNTER — Ambulatory Visit (INDEPENDENT_AMBULATORY_CARE_PROVIDER_SITE_OTHER): Payer: BC Managed Care – PPO | Admitting: Osteopathic Medicine

## 2020-09-26 ENCOUNTER — Other Ambulatory Visit (HOSPITAL_COMMUNITY)
Admission: RE | Admit: 2020-09-26 | Discharge: 2020-09-26 | Disposition: A | Discharge status: Home / Self Care | Payer: BC Managed Care – PPO | Source: Ambulatory Visit | Attending: Osteopathic Medicine | Admitting: Osteopathic Medicine

## 2020-09-26 VITALS — BP 108/71 | HR 93 | Temp 98.0°F | Ht 62.0 in | Wt 185.0 lb

## 2020-09-26 DIAGNOSIS — R7309 Other abnormal glucose: Secondary | ICD-10-CM | POA: Diagnosis not present

## 2020-09-26 DIAGNOSIS — Z0000 Encounter for general adult medical examination without abnormal findings: Secondary | ICD-10-CM | POA: Diagnosis not present

## 2020-09-26 DIAGNOSIS — D649 Anemia, unspecified: Secondary | ICD-10-CM | POA: Diagnosis not present

## 2020-09-26 DIAGNOSIS — Z23 Encounter for immunization: Secondary | ICD-10-CM | POA: Diagnosis not present

## 2020-09-26 DIAGNOSIS — Z124 Encounter for screening for malignant neoplasm of cervix: Secondary | ICD-10-CM

## 2020-09-26 DIAGNOSIS — Z8619 Personal history of other infectious and parasitic diseases: Secondary | ICD-10-CM

## 2020-09-26 MED ORDER — VALACYCLOVIR HCL 1 G PO TABS: 2000.0000 mg | ORAL_TABLET | Freq: Two times a day (BID) | ORAL | 0 refills | Status: AC

## 2020-09-26 NOTE — Progress Notes (Signed)
Holly Jackson is a 39 y.o. female who presents to  [facility] at [facility]  today, 09/26/20, seeking care for the following:  . Annual physical  . Requests Rx for cold sore outbreaks prn      ASSESSMENT & PLAN with other pertinent findings:  The primary encounter diagnosis was Annual physical exam. Diagnoses of Flu vaccine need, Anemia, unspecified type, Cervical cancer screening, and History of cold sores were also pertinent to this visit.   No results found for this or any previous visit (from the past 24 hour(s)).   Patient Instructions  General Preventive Care  Most recent routine screening labs: ordered today.   Blood pressure goal 130/80 or less.   Tobacco: don't!   Alcohol: responsible moderation is ok for most adults - if you have concerns about your alcohol intake, please talk to me!  Exercise: as tolerated to reduce risk of cardiovascular disease and diabetes. Strength training will also prevent osteoporosis.   Mental health: if need for mental health care (medicines, counseling, other), or concerns about moods, please let me know!   Sexual / Reproductive health: if need for STD testing, or if concerns with libido/pain problems, please let me know! If you need to discuss family planning, please let me know!   Advanced Directive: Living Will and/or Healthcare Power of Attorney recommended for all adults, regardless of age or health.  Vaccines  Flu vaccine: for almost everyone, every fall.   Shingles vaccine: after age 48.   Pneumonia vaccines: after age 63  Tetanus booster: every 10 years, due 02/2029  COVID vaccine: THANKS for getting your vaccine! :) Cancer screenings   Colon cancer screening: for everyone age 62-75.   Breast cancer screening: mammogram at age 39  Cervical cancer screening: Pap due, done today.   Lung cancer screening: not needed since you quit smoking  Infection screenings  . HIV:  recommended screening at least once age 70-65 . Gonorrhea/Chlamydia: screening as needed . Hepatitis C: recommended once for everyone age 33-75 . TB: certain at-risk populations, or depending on work requirements and/or travel history Other . Bone Density Test: recommended for women at age 77   Orders Placed This Encounter  Procedures  . Flu Vaccine QUAD 6+ mos PF IM (Fluarix Quad PF)  . CBC  . COMPLETE METABOLIC PANEL WITH GFR  . Lipid panel    Meds ordered this encounter  Medications  . valACYclovir (VALTREX) 1000 MG tablet    Sig: Take 2 tablets (2,000 mg total) by mouth 2 (two) times daily for 1 day. For one day as needed for cold sore    Dispense:  12 tablet    Refill:  0    Detailed physical exam WNL     Follow-up instructions: Return in about 1 year (around 09/26/2021) for [place] (call week prior to visit for lab orders).                                         BP 108/71 (BP Location: Left Arm, Patient Position: Sitting)   Pulse 93   Temp 98 F (36.7 C)   Ht 5\' 2"  (1.575 m)   Wt 185 lb (83.9 kg)   SpO2 98%   BMI 33.84 kg/m   Current Meds  Medication Sig  . albuterol (VENTOLIN HFA) 108 (90 Base) MCG/ACT inhaler Inhale 2 puffs into the  lungs every 6 (six) hours as needed for wheezing.  . dicyclomine (BENTYL) 20 MG tablet Take 1 tablet (20 mg total) by mouth 3 (three) times daily as needed for spasms (abdominal pain).  . traZODone (DESYREL) 50 MG tablet TAKE 1/2- 2 TABLETS BY MOUTH ONCE DAILY AT BEDTIME AS NEEDED FOR SLEEP  . [DISCONTINUED] acetaminophen (TYLENOL) 500 MG tablet Take 500 mg by mouth every 6 (six) hours as needed for mild pain or moderate pain.  . [DISCONTINUED] clindamycin-benzoyl peroxide (BENZACLIN) gel Apply topically 2 (two) times daily.  . [DISCONTINUED] meloxicam (MOBIC) 15 MG tablet One tab PO qAM with a meal for 2 weeks, then daily prn pain.  . [DISCONTINUED] Phendimetrazine Tartrate 35 MG TABS Take  0.5-1 tablets (17.5-35 mg total) by mouth 3 (three) times daily before meals.  . [DISCONTINUED] Prenatal Vit-Fe Fumarate-FA (PRENATAL MULTIVITAMIN) TABS tablet Take 1 tablet by mouth at bedtime.     Results for orders placed or performed in visit on 09/26/20 (from the past 72 hour(s))  Cytology - PAP     Status: None   Collection Time: 09/26/20  2:58 PM  Result Value Ref Range   High risk HPV Negative    Adequacy      Satisfactory for evaluation; transformation zone component ABSENT.   Diagnosis      - Negative for intraepithelial lesion or malignancy (NILM)   Microorganisms      Fungal organisms present consistent with Candida spp.   Comment Normal Reference Range HPV - Negative   CBC     Status: Abnormal   Collection Time: 09/26/20  3:32 PM  Result Value Ref Range   WBC 7.4 3.8 - 10.8 Thousand/uL   RBC 4.94 3.80 - 5.10 Million/uL   Hemoglobin 12.9 11.7 - 15.5 g/dL   HCT 11.9 35 - 45 %   MCV 80.2 80.0 - 100.0 fL   MCH 26.1 (L) 27.0 - 33.0 pg   MCHC 32.6 32.0 - 36.0 g/dL   RDW 41.7 40.8 - 14.4 %   Platelets 335 140 - 400 Thousand/uL   MPV 9.3 7.5 - 12.5 fL  COMPLETE METABOLIC PANEL WITH GFR     Status: Abnormal   Collection Time: 09/26/20  3:32 PM  Result Value Ref Range   Glucose, Bld 140 (H) 65 - 139 mg/dL    Comment: .        Non-fasting reference interval .    BUN 15 7 - 25 mg/dL   Creat 8.18 5.63 - 1.49 mg/dL   GFR, Est Non African American 119 > OR = 60 mL/min/1.59m2   GFR, Est African American 138 > OR = 60 mL/min/1.55m2   BUN/Creatinine Ratio NOT APPLICABLE 6 - 22 (calc)   Sodium 137 135 - 146 mmol/L   Potassium 4.0 3.5 - 5.3 mmol/L   Chloride 102 98 - 110 mmol/L   CO2 28 20 - 32 mmol/L   Calcium 9.5 8.6 - 10.2 mg/dL   Total Protein 7.6 6.1 - 8.1 g/dL   Albumin 4.6 3.6 - 5.1 g/dL   Globulin 3.0 1.9 - 3.7 g/dL (calc)   AG Ratio 1.5 1.0 - 2.5 (calc)   Total Bilirubin 0.3 0.2 - 1.2 mg/dL   Alkaline phosphatase (APISO) 71 31 - 125 U/L   AST 16 10 - 30 U/L    ALT 12 6 - 29 U/L  Lipid panel     Status: Abnormal   Collection Time: 09/26/20  3:32 PM  Result Value Ref Range  Cholesterol 175 <200 mg/dL   HDL 38 (L) > OR = 50 mg/dL   Triglycerides 761 (H) <150 mg/dL   LDL Cholesterol (Calc) 106 (H) mg/dL (calc)    Comment: Reference range: <100 . Desirable range <100 mg/dL for primary prevention;   <70 mg/dL for patients with CHD or diabetic patients  with > or = 2 CHD risk factors. Marland Kitchen LDL-C is now calculated using the Martin-Hopkins  calculation, which is a validated novel method providing  better accuracy than the Friedewald equation in the  estimation of LDL-C.  Horald Pollen et al. Lenox Ahr. 6073;710(62): 2061-2068  (http://education.[employer].com/faq/FAQ164)    Total CHOL/HDL Ratio 4.6 <5.0 (calc)   Non-HDL Cholesterol (Calc) 137 (H) <130 mg/dL (calc)    Comment: For patients with diabetes plus 1 major ASCVD risk  factor, treating to a non-HDL-C goal of <100 mg/dL  (LDL-C of <69 mg/dL) is considered a therapeutic  option.     No results found.     All questions at time of visit were answered - patient instructed to contact office with any additional concerns or updates.  ER/RTC precautions were reviewed with the patient as applicable.   Please note: voice recognition software was used to produce this document, and typos may escape review. Please contact Dr. Lyn Hollingshead for any needed clarifications.

## 2020-09-26 NOTE — Patient Instructions (Signed)
General Preventive Care  Most recent routine screening labs: ordered today.   Blood pressure goal 130/80 or less.   Tobacco: don't!   Alcohol: responsible moderation is ok for most adults - if you have concerns about your alcohol intake, please talk to me!  Exercise: as tolerated to reduce risk of cardiovascular disease and diabetes. Strength training will also prevent osteoporosis.   Mental health: if need for mental health care (medicines, counseling, other), or concerns about moods, please let me know!   Sexual / Reproductive health: if need for STD testing, or if concerns with libido/pain problems, please let me know! If you need to discuss family planning, please let me know!   Advanced Directive: Living Will and/or Healthcare Power of Attorney recommended for all adults, regardless of age or health.  Vaccines  Flu vaccine: for almost everyone, every fall.   Shingles vaccine: after age 78.   Pneumonia vaccines: after age 24  Tetanus booster: every 10 years, due 02/2029  COVID vaccine: THANKS for getting your vaccine! :) Cancer screenings   Colon cancer screening: for everyone age 52-75.   Breast cancer screening: mammogram at age 64  Cervical cancer screening: Pap due, done today.   Lung cancer screening: not needed since you quit smoking  Infection screenings  . HIV: recommended screening at least once age 76-65 . Gonorrhea/Chlamydia: screening as needed . Hepatitis C: recommended once for everyone age 21-75 . TB: certain at-risk populations, or depending on work requirements and/or travel history Other . Bone Density Test: recommended for women at age 75

## 2020-09-27 LAB — CYTOLOGY - PAP
Adequacy: ABSENT
Comment: NEGATIVE
Diagnosis: NEGATIVE
High risk HPV: NEGATIVE

## 2020-09-28 DIAGNOSIS — Z8619 Personal history of other infectious and parasitic diseases: Secondary | ICD-10-CM | POA: Insufficient documentation

## 2020-09-28 LAB — CBC
HCT: 39.6 % (ref 35.0–45.0)
Hemoglobin: 12.9 g/dL (ref 11.7–15.5)
MCH: 26.1 pg — ABNORMAL LOW (ref 27.0–33.0)
MCHC: 32.6 g/dL (ref 32.0–36.0)
MCV: 80.2 fL (ref 80.0–100.0)
MPV: 9.3 fL (ref 7.5–12.5)
Platelets: 335 10*3/uL (ref 140–400)
RBC: 4.94 10*6/uL (ref 3.80–5.10)
RDW: 13.6 % (ref 11.0–15.0)
WBC: 7.4 10*3/uL (ref 3.8–10.8)

## 2020-09-28 LAB — COMPLETE METABOLIC PANEL WITH GFR
AG Ratio: 1.5 (calc) (ref 1.0–2.5)
ALT: 12 U/L (ref 6–29)
AST: 16 U/L (ref 10–30)
Albumin: 4.6 g/dL (ref 3.6–5.1)
Alkaline phosphatase (APISO): 71 U/L (ref 31–125)
BUN: 15 mg/dL (ref 7–25)
CO2: 28 mmol/L (ref 20–32)
Calcium: 9.5 mg/dL (ref 8.6–10.2)
Chloride: 102 mmol/L (ref 98–110)
Creat: 0.55 mg/dL (ref 0.50–1.10)
GFR, Est African American: 138 mL/min/{1.73_m2} (ref >=60)
GFR, Est Non African American: 119 mL/min/{1.73_m2} (ref >=60)
Globulin: 3 g/dL (calc) (ref 1.9–3.7)
Glucose, Bld: 140 mg/dL — ABNORMAL HIGH (ref 65–139)
Potassium: 4 mmol/L (ref 3.5–5.3)
Sodium: 137 mmol/L (ref 135–146)
Total Bilirubin: 0.3 mg/dL (ref 0.2–1.2)
Total Protein: 7.6 g/dL (ref 6.1–8.1)

## 2020-09-28 LAB — LIPID PANEL
Cholesterol: 175 mg/dL (ref <200)
HDL: 38 mg/dL — ABNORMAL LOW (ref >=50)
LDL Cholesterol (Calc): 106 mg/dL (calc) — ABNORMAL HIGH
Non-HDL Cholesterol (Calc): 137 mg/dL (calc) — ABNORMAL HIGH (ref <130)
Total CHOL/HDL Ratio: 4.6 (calc) (ref <5.0)
Triglycerides: 192 mg/dL — ABNORMAL HIGH (ref <150)

## 2020-09-28 LAB — TEST AUTHORIZATION

## 2020-09-28 LAB — HEMOGLOBIN A1C W/OUT EAG: Hgb A1c MFr Bld: 5.2 % of total Hgb (ref <5.7)

## 2021-02-14 ENCOUNTER — Ambulatory Visit (INDEPENDENT_AMBULATORY_CARE_PROVIDER_SITE_OTHER): Payer: BC Managed Care – PPO | Admitting: Osteopathic Medicine

## 2021-02-14 ENCOUNTER — Other Ambulatory Visit: Payer: Self-pay

## 2021-02-14 ENCOUNTER — Other Ambulatory Visit: Payer: Self-pay | Admitting: Osteopathic Medicine

## 2021-02-14 ENCOUNTER — Encounter: Payer: Self-pay | Admitting: Osteopathic Medicine

## 2021-02-14 VITALS — BP 119/81 | HR 93 | Temp 98.0°F | Wt 192.0 lb

## 2021-02-14 DIAGNOSIS — K580 Irritable bowel syndrome with diarrhea: Secondary | ICD-10-CM | POA: Diagnosis not present

## 2021-02-14 DIAGNOSIS — F331 Major depressive disorder, recurrent, moderate: Secondary | ICD-10-CM | POA: Diagnosis not present

## 2021-02-14 DIAGNOSIS — F411 Generalized anxiety disorder: Secondary | ICD-10-CM

## 2021-02-14 MED ORDER — ONDANSETRON 8 MG PO TBDP: 8.0000 mg | ORAL_TABLET | Freq: Three times a day (TID) | ORAL | 1 refills | Status: DC | PRN

## 2021-02-14 MED ORDER — VORTIOXETINE HBR 10 MG PO TABS: 10.0000 mg | ORAL_TABLET | Freq: Every day | ORAL | 1 refills | Status: DC

## 2021-02-14 MED ORDER — RIFAXIMIN 550 MG PO TABS: 550.0000 mg | ORAL_TABLET | Freq: Three times a day (TID) | ORAL | 0 refills | Status: AC

## 2021-02-14 NOTE — Progress Notes (Signed)
Holly Jackson is a 40 y.o. female who presents to  [facility] Medicine at [facility]  today, 02/14/21, seeking care for the following:  Marland Kitchen Mental health: worsening anxiety/depression. Zoloft for awhile postpartum, went up to 100 mg from 50 mg (which was working for [employer]), 100 mg for about 3 mos at this point, not helping. Previous tx w/ Xanax caused fatigue, Prozac didn't help.  . IBS-D: worse about 4 mos, diarrhea at worst is 4-5 times / day, has avoided her usual food triggers, Bentyl helps some, (+)nausea, (+)abdominal cramping, (-)blood in stool . Hip pain: 2 mos, referral to sports med      ASSESSMENT & PLAN with other pertinent findings:  The primary encounter diagnosis was Irritable bowel syndrome with diarrhea. Diagnoses of GAD (generalized anxiety disorder) and Moderate episode of recurrent major depressive disorder (HCC) were also pertinent to this visit.    Patient Instructions  Plan:  IBS: Stool testing for infection Lab testing for other causes / complication  Rx: Xifaxan antibiotics three times per day for 2 weeks  Rx: Zofran (ondansetron) for nausea, 4 times per day as needed Can take Zofran before meals with Bentyl (dicyclomine)   Mental health: Rx: Trintellix 10 mg daily Once you have this prescription, can stop Zoloft and start Trintellix next day  May require authorization from your insurance      Orders Placed This Encounter  Procedures  . Ova and parasite examination  . CBC with Differential/Platelet  . COMPLETE METABOLIC PANEL WITH GFR  . Lipase  . Stool, WBC/Lactoferrin  . Clostridium difficile Toxin B, Qualitative, Real-Time PCR  . TSH    Meds ordered this encounter  Medications  . rifaximin (XIFAXAN) 550 MG TABS tablet    Sig: Take 1 tablet (550 mg total) by mouth 3 (three) times daily for 14 days.    Dispense:  42 tablet    Refill:  0  . vortioxetine HBr (TRINTELLIX) 10 MG TABS tablet    Sig: Take 1  tablet (10 mg total) by mouth daily.    Dispense:  90 tablet    Refill:  1     See below for relevant physical exam findings  See below for recent lab and imaging results reviewed  Medications, allergies, PMH, PSH, SocH, FamH reviewed below    Follow-up instructions: Return in about 4 weeks (around 03/14/2021) for RECHECK MENTAL HEALTH AND IBS ON NEW MEDICATIONS - SEE [place] SOONER IF NEEDED . REFER TO DR T FOR HIP PAIN, WHICH WAS NOT ADDRESSED TODAY D/T TIME CONSTRAINTS.                                        Exam:  BP 119/81 (BP Location: Right Arm, Patient Position: Sitting, Cuff Size: Large)   Pulse 93   Temp 98 F (36.7 C) (Oral)   Wt 192 lb 0.6 oz (87.1 kg)   BMI 35.12 kg/m   Constitutional: VS see above. General Appearance: alert, well-developed, well-nourished, NAD  Neck: No masses, trachea midline.   Respiratory: Normal respiratory effort. no wheeze, no rhonchi, no rales  Cardiovascular: S1/S2 normal, no murmur, no rub/gallop auscultated. RRR.   Musculoskeletal: Gait normal. Symmetric and independent movement of all extremities  Abdominal: mild TTP RLQ, non-distended, no appreciable organomegaly, neg Murphy's neg McBurney's, BS WNLx4  Neurological: Normal balance/coordination. No tremor.  Skin: warm, dry, intact.  Psychiatric: Normal judgment/insight. Normal mood and affect. Oriented x3.   Current Meds  Medication Sig  . albuterol (VENTOLIN HFA) 108 (90 Base) MCG/ACT inhaler Inhale 2 puffs into the lungs every 6 (six) hours as needed for wheezing.  . dicyclomine (BENTYL) 20 MG tablet Take 1 tablet (20 mg total) by mouth 3 (three) times daily as needed for spasms (abdominal pain).  . rifaximin (XIFAXAN) 550 MG TABS tablet Take 1 tablet (550 mg total) by mouth 3 (three) times daily for 14 days.  Marland Kitchen vortioxetine HBr (TRINTELLIX) 10 MG TABS tablet Take 1 tablet (10 mg total) by mouth daily.  . [DISCONTINUED] traZODone (DESYREL) 50 MG  tablet TAKE 1/2- 2 TABLETS BY MOUTH ONCE DAILY AT BEDTIME AS NEEDED FOR SLEEP    Allergies  Allergen Reactions  . Penicillins Shortness Of Breath    Has patient had a PCN reaction causing immediate rash, facial/tongue/throat swelling, SOB or lightheadedness with hypotension: Yes Has patient had a PCN reaction causing severe rash involving mucus membranes or skin necrosis: No Has patient had a PCN reaction that required hospitalization No Has patient had a PCN reaction occurring within the last 10 years: No If all of the above answers are "NO", then may proceed with Cephalosporin use.    . Sulfa Antibiotics Rash  . Sulfasalazine Rash    Patient Active Problem List   Diagnosis Date Noted  . History of cold sores 09/28/2020  . Lumbar degenerative disc disease 12/16/2019  . Normal postpartum course 06/21/2019  . Alteration in comfort associated with postoperative pain 06/21/2019  . Incidental lung nodule, > 17mm and < 70mm 06/21/2019  . Encounter for maternal care for low transverse scar from previous cesarean delivery 06/18/2019  . Cesarean delivery delivered 06/18/2019  . Single umbilical artery 06/02/2019  . Gestational diabetes 06/02/2019  . Infertility, female 06/02/2019  . History of cholestasis during pregnancy--2015 pregnancy 06/02/2019  . 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 Mild persistent asthma 05/23/2017  . Atelectasis of left lung 03/07/2017  . Allergy to penicillin - causes SOB; mild resp distress 03/01/2017  . Allergy to sulfa drugs - mod-severe rash 03/01/2017  . History of depression 03/01/2017  . AMA (advanced maternal age) multigravida 35+, third trimester 03/01/2017  . Velamentous insertion of umbilical cord 03/01/2017  . Previous cesarean delivery, antepartum condition or complication 09/22/2014  . Generalized anxiety disorder 09/17/2014  . Hx of abdominoplasty 09/17/2014    Family History   Problem Relation Age of Onset  . Miscarriages / Stillbirths Sister   . Stroke Mother   . Stroke Father   . Cerebral aneurysm Father   . Allergic rhinitis Neg Hx   . Angioedema Neg Hx   . Asthma Neg Hx   . Eczema Neg Hx   . Immunodeficiency Neg Hx   . Urticaria Neg Hx     Social History   Tobacco Use  Smoking Status Former Smoker  . Quit date: 2007  . Years since quitting: 15.1  Smokeless Tobacco Never Used    Past Surgical History:  Procedure Laterality Date  . ABDOMINOPLASTY    . CESAREAN SECTION N/A 09/21/2014   Procedure: CESAREAN SECTION;  Surgeon: Konrad Felix, MD;  Location: [facility] ORS;  Service: Obstetrics;  Laterality: N/A;  . CESAREAN SECTION N/A 03/04/2017   Procedure: CESAREAN SECTION;  Surgeon: Silverio Lay, MD;  Location: [facility];  Service: Obstetrics;  Laterality: N/A;  . CESAREAN SECTION N/A  06/18/2019   Procedure: Repeat CESAREAN SECTION;  Surgeon: Silverio Lay, MD;  Location: [facility] LD ORS;  Service: Obstetrics;  Laterality: N/A;  Nigel Bridgeman Assisting    Immunization History  Administered Date(s) Administered  . HPV Quadrivalent 07/24/2009  . Influenza Split 12/09/2012  . Influenza, Seasonal, Injecte, Preservative Fre 11/27/2016  . Influenza,inj,Quad PF,6+ Mos 09/09/2019, 09/26/2020  . Influenza,inj,quad, With Preservative 10/24/2018  . Influenza-Unspecified 09/23/2013, 10/24/2017, 09/29/2018  . PFIZER(Purple Top)SARS-COV-2 Vaccination 02/24/2020, 04/01/2020, 11/12/2020  . Pneumococcal Polysaccharide-23 06/13/2017  . Tdap 12/12/2016, 03/24/2019    No results found for this or any previous visit (from the past 2160 hour(s)).  No results found.     All questions at time of visit were answered - patient instructed to contact office with any additional concerns or updates. ER/RTC precautions were reviewed with the patient as applicable.   Please note: manual typing as well as voice recognition software may have been used to produce this  document - typos may escape review. Please contact Dr. Lyn Hollingshead for any needed clarifications.

## 2021-02-14 NOTE — Patient Instructions (Addendum)
Plan:  IBS: Stool testing for infection Lab testing for other causes / complication  Rx: Xifaxan antibiotics three times per day for 2 weeks  Rx: Zofran (ondansetron) for nausea, 4 times per day as needed Can take Zofran before meals with Bentyl (dicyclomine)   Mental health: Rx: Trintellix 10 mg daily Once you have this prescription, can stop Zoloft and start Trintellix next day  May require authorization from your insurance

## 2021-02-14 NOTE — Addendum Note (Signed)
Addended by: Deirdre Pippins on: 02/14/2021 04:33 PM   Modules accepted: Orders

## 2021-02-15 LAB — CBC WITH DIFFERENTIAL/PLATELET
Absolute Monocytes: 583 cells/uL (ref 200–950)
Basophils Absolute: 49 cells/uL (ref 0–200)
Basophils Relative: 0.6 %
Eosinophils Absolute: 130 cells/uL (ref 15–500)
Eosinophils Relative: 1.6 %
HCT: 38.6 % (ref 35.0–45.0)
Hemoglobin: 12.2 g/dL (ref 11.7–15.5)
Lymphs Abs: 1993 cells/uL (ref 850–3900)
MCH: 24.2 pg — ABNORMAL LOW (ref 27.0–33.0)
MCHC: 31.6 g/dL — ABNORMAL LOW (ref 32.0–36.0)
MCV: 76.4 fL — ABNORMAL LOW (ref 80.0–100.0)
MPV: 9.3 fL (ref 7.5–12.5)
Monocytes Relative: 7.2 %
Neutro Abs: 5346 cells/uL (ref 1500–7800)
Neutrophils Relative %: 66 %
Platelets: 337 10*3/uL (ref 140–400)
RBC: 5.05 10*6/uL (ref 3.80–5.10)
RDW: 14 % (ref 11.0–15.0)
Total Lymphocyte: 24.6 %
WBC: 8.1 10*3/uL (ref 3.8–10.8)

## 2021-02-15 LAB — COMPLETE METABOLIC PANEL WITH GFR
AG Ratio: 1.6 (calc) (ref 1.0–2.5)
ALT: 15 U/L (ref 6–29)
AST: 15 U/L (ref 10–30)
Albumin: 4.7 g/dL (ref 3.6–5.1)
Alkaline phosphatase (APISO): 84 U/L (ref 31–125)
BUN/Creatinine Ratio: 25 (calc) — ABNORMAL HIGH (ref 6–22)
BUN: 11 mg/dL (ref 7–25)
CO2: 28 mmol/L (ref 20–32)
Calcium: 9.7 mg/dL (ref 8.6–10.2)
Chloride: 102 mmol/L (ref 98–110)
Creat: 0.44 mg/dL — ABNORMAL LOW (ref 0.50–1.10)
GFR, Est African American: 147 mL/min/{1.73_m2} (ref >=60)
GFR, Est Non African American: 127 mL/min/{1.73_m2} (ref >=60)
Globulin: 2.9 g/dL (calc) (ref 1.9–3.7)
Glucose, Bld: 100 mg/dL — ABNORMAL HIGH (ref 65–99)
Potassium: 4 mmol/L (ref 3.5–5.3)
Sodium: 138 mmol/L (ref 135–146)
Total Bilirubin: 0.2 mg/dL (ref 0.2–1.2)
Total Protein: 7.6 g/dL (ref 6.1–8.1)

## 2021-02-15 LAB — LIPASE: Lipase: 13 U/L (ref 7–60)

## 2021-02-15 LAB — TSH: TSH: 3.31 mIU/L

## 2021-02-24 ENCOUNTER — Encounter: Payer: Self-pay | Admitting: Osteopathic Medicine

## 2021-02-27 NOTE — Telephone Encounter (Signed)
Sounds like pharmacy shuld have sent something about a PA for Trintellix and/or Xifaxan? I'd call pharmacy to see what they heck they told the patient and why they didn't reach out to [place].

## 2021-03-01 NOTE — Telephone Encounter (Signed)
Prior authorization for Trintellix submitted to patient's insurance. Pending a determination.   Alternative formularies are:  Citalopram Hydrobromide Escitalopram Oxalate FLUoxetine HCl PARoxetine HCl Sertraline HCl DULoxetine HCl Venlafaxine HCl ER

## 2021-03-09 ENCOUNTER — Encounter: Payer: Self-pay | Admitting: Osteopathic Medicine

## 2021-03-21 MED ORDER — SERTRALINE HCL 100 MG PO TABS: 100.0000 mg | ORAL_TABLET | Freq: Every day | ORAL | 3 refills | Status: DC

## 2021-04-04 ENCOUNTER — Encounter: Payer: Self-pay | Admitting: Family Medicine

## 2021-04-04 ENCOUNTER — Telehealth (INDEPENDENT_AMBULATORY_CARE_PROVIDER_SITE_OTHER): Payer: BC Managed Care – PPO | Admitting: Family Medicine

## 2021-04-04 DIAGNOSIS — R059 Cough, unspecified: Secondary | ICD-10-CM

## 2021-04-04 DIAGNOSIS — J324 Chronic pansinusitis: Secondary | ICD-10-CM

## 2021-04-04 MED ORDER — AZITHROMYCIN 250 MG PO TABS: ORAL_TABLET | ORAL | 0 refills | Status: DC

## 2021-04-04 MED ORDER — BENZONATATE 100 MG PO CAPS: 100.0000 mg | ORAL_CAPSULE | Freq: Two times a day (BID) | ORAL | 0 refills | Status: DC | PRN

## 2021-04-04 NOTE — Progress Notes (Signed)
Virtual Video Visit via MyChart Note  I connected with  Holly Jackson on 04/04/21 at 11:10 AM EDT by the video enabled telemedicine application for MyChart, and verified that I am speaking with the correct person using two identifiers.   I introduced myself as a Publishing rights manager with the practice. We discussed the limitations of evaluation and management by telemedicine and the availability of in person appointments. The patient expressed understanding and agreed to proceed.  Participating parties in this visit include: The patient and the nurse practitioner listed. The patient is: At home I am: In the office - Primary Care Kathryne Sharper  Subjective:    CC:  Chief Complaint  Patient presents with  . Cough  . Sinusitis    HPI: Holly Jackson is a 40 y.o. year old female presenting today via MyChart today for cough and nasal congestion.  Patient reports she has been sick for 3 weeks. Initially she thought she just had allergies, but symptoms have progressed and cough is worsening. She states her cough is productive with yellow sputum and she has multiple coughing fits a day lasting anywhere from 5-15 minutes - this is keeping her from being able to get her work done and sleep adequately. She is also reporting mild frontal headache and nasal congestion with postnasal drip. She has been taking OTC cold and allergy medications with no improvement. She denies chest pain/tightness, dyspnea, wheezing, ear pain, sneezing, excessive fatigue.  No known sick contacts. She did not test for COVID at the beginning of illness, but is fully vaccinated.     Past medical history, Surgical history, Family history not pertinant except as noted below, Social history, Allergies, and medications have been entered into the medical record, reviewed, and corrections made.   Review of Systems:  All review of systems negative except what is listed in the HPI   Objective:    General:  Speaking clearly in complete  sentences. Absent shortness of breath noted.   Alert and oriented x3.   Normal judgment.  Absent acute distress.   Impression and Recommendations:    1. Pansinusitis, unspecified chronicity - azithromycin (ZITHROMAX Z-PAK) 250 MG tablet; Take 2 tablets (500 mg) on  Day 1,  followed by 1 tablet (250 mg) once daily on Days 2 through 5.  Dispense: 6 tablet; Refill: 0  2. Cough - azithromycin (ZITHROMAX Z-PAK) 250 MG tablet; Take 2 tablets (500 mg) on  Day 1,  followed by 1 tablet (250 mg) once daily on Days 2 through 5.  Dispense: 6 tablet; Refill: 0 - benzonatate (TESSALON) 100 MG capsule; Take 1 capsule (100 mg total) by mouth 2 (two) times daily as needed for cough.  Dispense: 20 capsule; Refill: 0  Given the duration of her symptoms, would like to go ahead and treat for sinusitis. Also sending in some tessalon for the cough, if this does not help we can send in some stronger cough syrup. She should continue supportive care including rest, hydration, humidifier use, warm liquids, honey, OTC cough and cold medications including Mucinex. Educated on signs and symptoms that would require further evaluation  Follow-up if symptoms worsen or fail to improve.    I discussed the assessment and treatment plan with the patient. The patient was provided an opportunity to ask questions and all were answered. The patient agreed with the plan and demonstrated an understanding of the instructions.   The patient was advised to call back or seek an in-person evaluation if the symptoms worsen or if  the condition fails to improve as anticipated.  I provided 20 minutes of non-face-to-face interaction with this MYCHART visit including intake, same-day documentation, and chart review.   Clayborne Dana, NP

## 2021-04-04 NOTE — Patient Instructions (Signed)
Over the counter medications that may be helpful for symptoms:  . Guaifenesin 1200 mg extended release tabs twice daily, with plenty of water o For cough and congestion o Brand name: Mucinex   . Pseudoephedrine 30 mg, one or two tabs every 4 to 6 hours o For sinus congestion o Brand name: Sudafed o You must get this from the pharmacy counter.  . Oxymetazoline nasal spray each morning, one spray in each nostril, for NO MORE THAN 3 days  o For nasal and sinus congestion o Brand name: Afrin . Saline nasal spray or Saline Nasal Irrigation 3-5 times a day o For nasal and sinus congestion o Brand names: Ocean or AYR . Fluticasone nasal spray, one spray in each nostril, each morning (after oxymetazoline and saline, if used) o For nasal and sinus congestion o Brand name: Flonase . Warm salt water gargles  o For sore throat o Every few hours as needed . Alternate ibuprofen 400-600 mg and acetaminophen 1000 mg every 4-6 hours o For fever, body aches, headache o Brand names: Motrin or Advil and Tylenol . Dextromethorphan 12-hour cough version 30 mg every 12 hours  o For cough o Brand name: Delsym Stop all other cold medications for now (Nyquil, Dayquil, Tylenol Cold, Theraflu, etc) and other non-prescription cough/cold preparations. Many of these have the same ingredients listed above and could cause an overdose of medication.   Herbal treatments that have been shown to be helpful in some patients include: Vitamin C 1000mg  per day Vitamin D 4000iU per day Zinc 100mg  per day Quercetin 25-500mg  twice a day Melatonin 5-10mg  at bedtime  General Instructions . Allow your body to rest . Drink PLENTY of fluids  If you develop severe shortness of breath, uncontrolled fevers, coughing up blood, confusion, chest pain, or signs of dehydration (such as significantly decreased urine amounts or dizziness with standing) please go to the ER.

## 2021-04-05 ENCOUNTER — Encounter: Payer: Self-pay | Admitting: Osteopathic Medicine

## 2021-04-05 DIAGNOSIS — R059 Cough, unspecified: Secondary | ICD-10-CM

## 2021-04-06 MED ORDER — PROMETHAZINE-DM 6.25-15 MG/5ML PO SYRP: 2.5000 mL | ORAL_SOLUTION | Freq: Four times a day (QID) | ORAL | 0 refills | Status: AC | PRN

## 2021-05-02 ENCOUNTER — Ambulatory Visit (INDEPENDENT_AMBULATORY_CARE_PROVIDER_SITE_OTHER): Payer: BC Managed Care – PPO | Admitting: Osteopathic Medicine

## 2021-05-02 ENCOUNTER — Encounter: Payer: Self-pay | Admitting: Osteopathic Medicine

## 2021-05-02 ENCOUNTER — Other Ambulatory Visit: Payer: Self-pay | Admitting: Osteopathic Medicine

## 2021-05-02 ENCOUNTER — Ambulatory Visit (INDEPENDENT_AMBULATORY_CARE_PROVIDER_SITE_OTHER): Payer: BC Managed Care – PPO

## 2021-05-02 ENCOUNTER — Other Ambulatory Visit: Payer: Self-pay

## 2021-05-02 VITALS — BP 134/77 | HR 97 | Temp 99.1°F | Wt 186.0 lb

## 2021-05-02 DIAGNOSIS — K580 Irritable bowel syndrome with diarrhea: Secondary | ICD-10-CM | POA: Diagnosis not present

## 2021-05-02 DIAGNOSIS — J4530 Mild persistent asthma, uncomplicated: Secondary | ICD-10-CM | POA: Diagnosis not present

## 2021-05-02 DIAGNOSIS — F331 Major depressive disorder, recurrent, moderate: Secondary | ICD-10-CM | POA: Diagnosis not present

## 2021-05-02 DIAGNOSIS — R053 Chronic cough: Secondary | ICD-10-CM

## 2021-05-02 DIAGNOSIS — F411 Generalized anxiety disorder: Secondary | ICD-10-CM | POA: Diagnosis not present

## 2021-05-02 DIAGNOSIS — R059 Cough, unspecified: Secondary | ICD-10-CM | POA: Diagnosis not present

## 2021-05-02 MED ORDER — ADVAIR HFA 115-21 MCG/ACT IN AERO: 2.0000 | INHALATION_SPRAY | Freq: Two times a day (BID) | RESPIRATORY_TRACT | 5 refills | Status: DC

## 2021-05-02 NOTE — Progress Notes (Signed)
Holly Jackson is a 40 y.o. female who presents to  [facility] at [facility]  today, 05/02/21, seeking care for the following:  . Persistent cough: seen about a month ago via virtual visit and treated for sinusitis w/ azithromycin and tessalon, escalated to promethazine DM cough syrup. Never COVID tested but is vaccinated. Hx asthma, albuterol Rx last sent by [place] 11/2019. Today pt reports persistent nagging cough, occasional sputum production. No SOB.      ASSESSMENT & PLAN with other pertinent findings:  The primary encounter diagnosis was Persistent cough. A diagnosis of Mild persistent asthma without complication was also pertinent to this visit.    Patient Instructions  Chronic cough causes:  Possible lung irritation due to   asthma - sent new inhaler to use twice daily for 2-3 weeks and continue through summer if it's helping. OR  Postviral syndrome from cold/flu or COVID - inhaler should still help this condition   Possible cough reflex due to  Postnasal drip - allergy medications should help: antihistamines like Claritin or similar +/- steroid nasal spray like Flonase or similar. OR  acid reflux even if no bad heartburn - antacid prescription for 6-12 weeks might help cough   Plan:  Xray today looks ok  Try new inhaler  Can add Claritin or similar, plus Flonase or similar  If these aren't helping, call me and we weill add antacid trial  If that's not helping will refer to lung doctor (pulmonologist) for further testing/treatment    Orders Placed This Encounter  Procedures  . DG Chest 2 View    Meds ordered this encounter  Medications  . fluticasone-salmeterol (ADVAIR HFA) 115-21 MCG/ACT inhaler    Sig: Inhale 2 puffs into the lungs 2 (two) times daily.    Dispense:  1 each    Refill:  5   CXR personally reviewed and no concerns, await radiology over-read       See below for relevant physical exam findings   See below for recent lab and imaging results reviewed  Medications, allergies, PMH, PSH, SocH, FamH reviewed below    Follow-up instructions: Return if symptoms worsen or fail to improve.                                        Exam:  BP 134/77 (BP Location: Left Arm, Patient Position: Sitting, Cuff Size: Normal)   Pulse 97   Temp 99.1 F (37.3 C) (Oral)   Wt 186 lb (84.4 kg)   SpO2 99%   BMI 34.02 kg/m   Constitutional: VS see above. General Appearance: alert, well-developed, well-nourished, NAD  Neck: No masses, trachea midline.   Respiratory: Normal respiratory effort. no wheeze, no rhonchi, no rales  Cardiovascular: S1/S2 normal, no murmur, no rub/gallop auscultated. RRR.   Musculoskeletal: Gait normal. Symmetric and independent movement of all extremities  Neurological: Normal balance/coordination. No tremor.  Skin: warm, dry, intact.   Psychiatric: Normal judgment/insight. Normal mood and affect. Oriented x3.   Current Meds  Medication Sig  . albuterol (VENTOLIN HFA) 108 (90 Base) MCG/ACT inhaler Inhale 2 puffs into the lungs every 6 (six) hours as needed for wheezing.  . dicyclomine (BENTYL) 20 MG tablet Take 1 tablet (20 mg total) by mouth 3 (three) times daily as needed for spasms (abdominal pain).  . fluticasone-salmeterol (ADVAIR HFA) 115-21 MCG/ACT inhaler Inhale 2 puffs into  the lungs 2 (two) times daily.  . ondansetron (ZOFRAN-ODT) 8 MG disintegrating tablet Take 1 tablet (8 mg total) by mouth every 8 (eight) hours as needed for nausea.  . sertraline (ZOLOFT) 100 MG tablet Take 1 tablet (100 mg total) by mouth daily.    Allergies  Allergen Reactions  . Penicillins Shortness Of Breath    Has patient had a PCN reaction causing immediate rash, facial/tongue/throat swelling, SOB or lightheadedness with hypotension: Yes Has patient had a PCN reaction causing severe rash involving mucus membranes or skin necrosis: No Has  patient had a PCN reaction that required hospitalization No Has patient had a PCN reaction occurring within the last 10 years: No If all of the above answers are "NO", then may proceed with Cephalosporin use.    . Sulfa Antibiotics Rash  . Sulfasalazine Rash    Patient Active Problem List   Diagnosis Date Noted  . History of cold sores 09/28/2020  . Lumbar degenerative disc disease 12/16/2019  . Normal postpartum course 06/21/2019  . Alteration in comfort associated with postoperative pain 06/21/2019  . Incidental lung nodule, > 3mm and < 8mm 06/21/2019  . Encounter for maternal care for low transverse scar from previous cesarean delivery 06/18/2019  . Cesarean delivery delivered 06/18/2019  . Single umbilical artery 06/02/2019  . Gestational diabetes 06/02/2019  . Infertility, female 06/02/2019  . History of cholestasis during pregnancy--2015 pregnancy 06/02/2019  . 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 Mild persistent asthma 05/23/2017  . Atelectasis of left lung 03/07/2017  . Allergy to penicillin - causes SOB; mild resp distress 03/01/2017  . Allergy to sulfa drugs - mod-severe rash 03/01/2017  . History of depression 03/01/2017  . AMA (advanced maternal age) multigravida 35+, third trimester 03/01/2017  . Velamentous insertion of umbilical cord 03/01/2017  . Previous cesarean delivery, antepartum condition or complication 09/22/2014  . Generalized anxiety disorder 09/17/2014  . Hx of abdominoplasty 09/17/2014    Family History  Problem Relation Age of Onset  . Miscarriages / Stillbirths Sister   . Stroke Mother   . Stroke Father   . Cerebral aneurysm Father   . Allergic rhinitis Neg Hx   . Angioedema Neg Hx   . Asthma Neg Hx   . Eczema Neg Hx   . Immunodeficiency Neg Hx   . Urticaria Neg Hx     Social History   Tobacco Use  Smoking Status Former Smoker  . Quit date: 2007  . Years since quitting:  15.3  Smokeless Tobacco Never Used    Past Surgical History:  Procedure Laterality Date  . ABDOMINOPLASTY    . CESAREAN SECTION N/A 09/21/2014   Procedure: CESAREAN SECTION;  Surgeon: Konrad FelixEma Wakuru Kulwa, MD;  Location: [facility];  Service: Obstetrics;  Laterality: N/A;  . CESAREAN SECTION N/A 03/04/2017   Procedure: CESAREAN SECTION;  Surgeon: Silverio LaySandra Rivard, MD;  [facility];  Service: Obstetrics;  Laterality: N/A;  . CESAREAN SECTION N/A 06/18/2019   Procedure: Repeat CESAREAN SECTION;  Surgeon: Silverio Layivard, Sandra, MD;  Location: [facility];  Service: Obstetrics;  Laterality: N/A;  Nigel BridgemanVicki Latham Assisting    Immunization History  Administered Date(s) Administered  . HPV Quadrivalent 07/24/2009  . Influenza Split 12/09/2012  . Influenza, Seasonal, Injecte, Preservative Fre 11/27/2016  . Influenza,inj,Quad PF,6+ Mos 09/09/2019, 09/26/2020  . Influenza,inj,quad, With Preservative 10/24/2018  . Influenza-Unspecified 09/23/2013, 10/24/2017, 09/29/2018  . PFIZER(Purple Top)SARS-COV-2 Vaccination 02/24/2020, 04/01/2020, 11/12/2020  .  Pneumococcal Polysaccharide-23 06/13/2017  . Tdap 12/12/2016, 03/24/2019    Recent Results (from the past 2160 hour(s))  COMPLETE METABOLIC PANEL WITH GFR     Status: Abnormal   Collection Time: 02/14/21 12:00 AM  Result Value Ref Range   Glucose, Bld 100 (H) 65 - 99 mg/dL    Comment: .            Fasting reference interval . For someone without known diabetes, a glucose value between 100 and 125 mg/dL is consistent with prediabetes and should be confirmed with a follow-up test. .    BUN 11 7 - 25 mg/dL   Creat 0.34 (L) 9.17 - 1.10 mg/dL   GFR, Est Non African American 127 > OR = 60 mL/min/1.68m2   GFR, Est African American 147 > OR = 60 mL/min/1.43m2   BUN/Creatinine Ratio 25 (H) 6 - 22 (calc)   Sodium 138 135 - 146 mmol/L   Potassium 4.0 3.5 - 5.3 mmol/L   Chloride 102 98 - 110 mmol/L   CO2 28 20 - 32 mmol/L   Calcium 9.7 8.6 - 10.2  mg/dL   Total Protein 7.6 6.1 - 8.1 g/dL   Albumin 4.7 3.6 - 5.1 g/dL   Globulin 2.9 1.9 - 3.7 g/dL (calc)   AG Ratio 1.6 1.0 - 2.5 (calc)   Total Bilirubin 0.2 0.2 - 1.2 mg/dL   Alkaline phosphatase (APISO) 84 31 - 125 U/L   AST 15 10 - 30 U/L   ALT 15 6 - 29 U/L  Lipase     Status: None   Collection Time: 02/14/21 12:00 AM  Result Value Ref Range   Lipase 13 7 - 60 U/L  TSH     Status: None   Collection Time: 02/14/21 12:00 AM  Result Value Ref Range   TSH 3.31 mIU/L    Comment:           Reference Range .           > or = 20 Years  0.40-4.50 .                Pregnancy Ranges           First trimester    0.26-2.66           Second trimester   0.55-2.73           Third trimester    0.43-2.91   CBC with Differential/Platelet     Status: Abnormal   Collection Time: 02/14/21 12:00 AM  Result Value Ref Range   WBC 8.1 3.8 - 10.8 Thousand/uL   RBC 5.05 3.80 - 5.10 Million/uL   Hemoglobin 12.2 11.7 - 15.5 g/dL   HCT 91.5 05.6 - 97.9 %   MCV 76.4 (L) 80.0 - 100.0 fL   MCH 24.2 (L) 27.0 - 33.0 pg   MCHC 31.6 (L) 32.0 - 36.0 g/dL   RDW 48.0 16.5 - 53.7 %   Platelets 337 140 - 400 Thousand/uL   MPV 9.3 7.5 - 12.5 fL   Neutro Abs 5,346 1,500 - 7,800 cells/uL   Lymphs Abs 1,993 850 - 3,900 cells/uL   Absolute Monocytes 583 200 - 950 cells/uL   Eosinophils Absolute 130 15 - 500 cells/uL   Basophils Absolute 49 0 - 200 cells/uL   Neutrophils Relative % 66 %   Total Lymphocyte 24.6 %   Monocytes Relative 7.2 %   Eosinophils Relative 1.6 %   Basophils Relative 0.6 %  Ova and parasite  examination     Status: None   Collection Time: 05/02/21 12:00 AM   Specimen: Stool  Result Value Ref Range   MICRO NUMBER: [id number]    SPECIMEN QUALITY: Adequate    Source URINE    STATUS: FINAL    CONCENTRATE RESULT: No ova or parasites seen    TRICHROME RESULT: CANCELED     Comment: Result canceled by the ancillary.   COMMENT:      Routine Ova and Parasite exam may not detect some  parasites that occasionally cause diarrheal illness. Cryptosporidium Antigen and/or Cyclospora and Isospora Exam may be ordered to detect these parasites. One negative sample does not necessarily rule out  the presence of a parasitic infection.  For additional information, please refer to https://education.[facility].com/faq/FAQ203 (This link is being provided for informational/ educational purposes only.)     No results found.     All questions at time of visit were answered - patient instructed to contact office with any additional concerns or updates. ER/RTC precautions were reviewed with the patient as applicable.   Please note: manual typing as well as voice recognition software may have been used to produce this document - typos may escape review. Please contact Dr. Lyn Hollingshead for any needed clarifications.

## 2021-05-02 NOTE — Patient Instructions (Signed)
Chronic cough causes:  Possible lung irritation due to   asthma - sent new inhaler to use twice daily for 2-3 weeks and continue through summer if it's helping. OR  Postviral syndrome from cold/flu or COVID - inhaler should still help this condition   Possible cough reflex due to  Postnasal drip - allergy medications should help: antihistamines like Claritin or similar +/- steroid nasal spray like Flonase or similar. OR  acid reflux even if no bad heartburn - antacid prescription for 6-12 weeks might help cough   Plan:  Xray today looks ok  Try new inhaler  Can add Claritin or similar, plus Flonase or similar  If these aren't helping, call me and we weill add antacid trial  If that's not helping will refer to lung doctor (pulmonologist) for further testing/treatment

## 2021-05-03 LAB — OVA AND PARASITE EXAMINATION
CONCENTRATE RESULT:: NONE SEEN
MICRO NUMBER:: 11872416
SPECIMEN QUALITY:: ADEQUATE

## 2021-05-03 LAB — FECAL LACTOFERRIN, QUANT
Fecal Lactoferrin: NEGATIVE
MICRO NUMBER:: 11874592
SPECIMEN QUALITY:: ADEQUATE

## 2021-05-03 LAB — CLOSTRIDIUM DIFFICILE TOXIN B, QUALITATIVE, REAL-TIME PCR: Toxigenic C. Difficile by PCR: NOT DETECTED

## 2021-05-15 ENCOUNTER — Other Ambulatory Visit: Payer: Self-pay | Admitting: Osteopathic Medicine

## 2021-05-29 ENCOUNTER — Encounter: Payer: Self-pay | Admitting: Osteopathic Medicine

## 2021-05-29 DIAGNOSIS — R053 Chronic cough: Secondary | ICD-10-CM

## 2021-05-29 DIAGNOSIS — J4530 Mild persistent asthma, uncomplicated: Secondary | ICD-10-CM

## 2021-06-21 ENCOUNTER — Ambulatory Visit (INDEPENDENT_AMBULATORY_CARE_PROVIDER_SITE_OTHER): Payer: BC Managed Care – PPO | Admitting: Pulmonary Disease

## 2021-06-21 ENCOUNTER — Other Ambulatory Visit: Payer: Self-pay

## 2021-06-21 ENCOUNTER — Encounter: Payer: Self-pay | Admitting: Pulmonary Disease

## 2021-06-21 VITALS — BP 110/64 | HR 90 | Temp 98.5°F | Ht 62.0 in | Wt 184.6 lb

## 2021-06-21 DIAGNOSIS — R053 Chronic cough: Secondary | ICD-10-CM | POA: Diagnosis not present

## 2021-06-21 MED ORDER — HYDROCODONE BIT-HOMATROP MBR 5-1.5 MG/5ML PO SOLN: 5.0000 mL | Freq: Four times a day (QID) | ORAL | 0 refills | Status: DC | PRN

## 2021-06-21 MED ORDER — FLUTICASONE PROPIONATE 50 MCG/ACT NA SUSP: 1.0000 | Freq: Every day | NASAL | 2 refills | Status: DC

## 2021-06-21 MED ORDER — AZELASTINE HCL 0.1 % NA SOLN: 1.0000 | Freq: Two times a day (BID) | NASAL | 12 refills | Status: DC

## 2021-06-21 MED ORDER — BENZONATATE 200 MG PO CAPS: 200.0000 mg | ORAL_CAPSULE | Freq: Three times a day (TID) | ORAL | 2 refills | Status: DC | PRN

## 2021-06-21 MED ORDER — MONTELUKAST SODIUM 10 MG PO TABS: 10.0000 mg | ORAL_TABLET | Freq: Every day | ORAL | 5 refills | Status: DC

## 2021-06-21 NOTE — Patient Instructions (Signed)
Stop using advair  Use nasal irrigation (saline nasal spray) nightly  Flonase 1 spray in each nostril nightly  Azelastine 1 spray in each nostril twice per day  Singulair 10 mg pill nightly  Salt water gargle once or twice per day  Sip water when you have urge to cough  Uses sugarless candy to keep your mouth moist  Use 1 teaspoon of local honey once or twice per day to help with cough  Avoid forcing a cough or clearing your throat  Albuterol 2 puffs every 6 hours as needed for cough, wheeze, or chest congestion  Hycodan 5 ml nightly as needed to help with cough  Follow up in 2 to 3 weeks with Dr. Craige Cotta or Nurse Practitioner

## 2021-06-21 NOTE — Progress Notes (Signed)
[facility]  Chief Complaint  Patient presents with   Consult    Hx of asthma.  Dry cough since March.    Constitutional:  BP 110/64 (BP Location: Right Arm, Patient Position: Sitting, Cuff Size: Normal)   Pulse 90   Temp 98.5 F (36.9 C) (Oral)   Ht 5\' 2"  (1.575 m)   Wt 184 lb 9.6 oz (83.7 kg)   SpO2 95%   BMI 33.76 kg/m   Past Medical History:  Anxiety, Depression, IBS  Past Surgical History:  She  has a past surgical history that includes Abdominoplasty; Cesarean section (N/A, 09/21/2014); Cesarean section (N/A, 03/04/2017); and Cesarean section (N/A, 06/18/2019).  Brief Summary:  Holly Jackson is a 40 y.o. female with cough.      Subjective:   She has history of allergies and asthma.  She was seen in 2018 by Dr. 2019 with Allergy and Asthma.  Felt to have contact dermatitis, allergic rhinitis, asthma, and recurrent infections.  She moved into a new house around March.  After that she developed sinus congestion and drainage.  Since then she has cough.  This happens day and night and is dry.  Gets wheeze from upper chest.  Feels like she has something in her throat.  Has tried advair and albuterol w/o improvement.  Benzonatate helped some.  Hasn't used nasal irrigation or flonase recently.  Was told she had allergy to trees and dust.  No animal/bird exposures.  Smoked briefly in college.  From April.  Has lived in [place] since 2001.  Chest xray from 05/03/21 showed Lt base scarring.  Physical Exam:   Appearance - well kempt   ENMT - no sinus tenderness, no oral exudate, no LAN, Mallampati 3 airway, no stridor, clear nasal drainage  Respiratory - equal breath sounds bilaterally, no wheezing or rales  CV - s1s2 regular rate and rhythm, no murmurs  Ext - no clubbing, no edema  Skin - no rashes  Psych - normal mood and affect   Pulmonary testing:    Chest Imaging:    Social History:  She  reports that she quit smoking  about 15 years ago. Her smoking use included cigarettes. She has never used smokeless tobacco. She reports that she does not drink alcohol and does not use drugs.  Family History:  Her family history includes Cerebral aneurysm in her father; Miscarriages / Stillbirths in her sister; Stroke in her father and mother.     Assessment/Plan:   Chronic cough with history of allergies. - will have her try nasal irrigation, flonase, singulair, and azelastine - she can use benzonatate during the day and hycodan at night to help with cough - salt water gargle daily - sip water, sugarless candy, avoid cough or throat clearing - 1 tsp honey to help reduce cough and throat irritation - explained recover time from this type of cough can be several weeks - if no improvement with above regimen, then will need further testings (labs, PFT, CT chest/sinus)  Asthma. - hasn't improved with inhaler therapy - advair might be contributing to upper airway irritation and cough - stop advair - prn albuterol  Time Spent Involved in Patient Care on Day of Examination:  46 minutes  Follow up:   Patient Instructions  Stop using advair  Use nasal irrigation (saline nasal spray) nightly  Flonase 1 spray in each nostril nightly  Azelastine 1 spray in each nostril twice per day  Singulair 10 mg pill  nightly  Salt water gargle once or twice per day  Sip water when you have urge to cough  Uses sugarless candy to keep your mouth moist  Use 1 teaspoon of local honey once or twice per day to help with cough  Avoid forcing a cough or clearing your throat  Albuterol 2 puffs every 6 hours as needed for cough, wheeze, or chest congestion  Hycodan 5 ml nightly as needed to help with cough  Follow up in 2 to 3 weeks with Dr. Craige Cotta or Nurse Practitioner  Medication List:   Allergies as of 06/21/2021       Reactions   Penicillins Shortness Of Breath   Has patient had a PCN reaction causing immediate rash,  facial/tongue/throat swelling, SOB or lightheadedness with hypotension: Yes Has patient had a PCN reaction causing severe rash involving mucus membranes or skin necrosis: No Has patient had a PCN reaction that required hospitalization No Has patient had a PCN reaction occurring within the last 10 years: No If all of the above answers are "NO", then may proceed with Cephalosporin use.   Sulfa Antibiotics Rash   Sulfasalazine Rash        Medication List        Accurate as of June 21, 2021  2:28 PM. If you have any questions, ask your nurse or doctor.          STOP taking these medications    Advair HFA 115-21 MCG/ACT inhaler Generic drug: fluticasone-salmeterol Stopped by: Coralyn Helling, MD   ondansetron 8 MG disintegrating tablet Commonly known as: ZOFRAN-ODT Stopped by: Coralyn Helling, MD       TAKE these medications    albuterol 108 (90 Base) MCG/ACT inhaler Commonly known as: VENTOLIN HFA Inhale 2 puffs into the lungs every 6 (six) hours as needed for wheezing.   azelastine 0.1 % nasal spray Commonly known as: ASTELIN Place 1 spray into both nostrils 2 (two) times daily. Use in each nostril as directed Started by: Coralyn Helling, MD   benzonatate 200 MG capsule Commonly known as: TESSALON Take 1 capsule (200 mg total) by mouth 3 (three) times daily as needed for cough. Started by: Coralyn Helling, MD   dicyclomine 20 MG tablet Commonly known as: BENTYL TAKE 1 TABLET BY MOUTH THREE TIMES DAILY AS NEEDED FOR  SPASMS  (ABDOMINAL  PAIN)   fluticasone 50 MCG/ACT nasal spray Commonly known as: FLONASE Place 1 spray into both nostrils daily. Started by: Coralyn Helling, MD   HYDROcodone bit-homatropine 5-1.5 MG/5ML syrup Commonly known as: HYCODAN Take 5 mLs by mouth every 6 (six) hours as needed for cough. Started by: Coralyn Helling, MD   montelukast 10 MG tablet Commonly known as: SINGULAIR Take 1 tablet (10 mg total) by mouth at bedtime. Started by: Coralyn Helling, MD    sertraline 100 MG tablet Commonly known as: ZOLOFT Take 1 tablet (100 mg total) by mouth daily.   UNISOM PO Take 1 tablet by mouth as needed. [employer] brand        Signature:  Coralyn Helling, MD [facility] Pager - [phone] 06/21/2021, 2:28 PM

## 2021-08-15 ENCOUNTER — Telehealth (INDEPENDENT_AMBULATORY_CARE_PROVIDER_SITE_OTHER): Payer: BC Managed Care – PPO | Admitting: Physician Assistant

## 2021-08-15 ENCOUNTER — Encounter: Payer: Self-pay | Admitting: Physician Assistant

## 2021-08-15 VITALS — Wt 184.5 lb

## 2021-08-15 DIAGNOSIS — J069 Acute upper respiratory infection, unspecified: Secondary | ICD-10-CM

## 2021-08-15 DIAGNOSIS — J329 Chronic sinusitis, unspecified: Secondary | ICD-10-CM | POA: Diagnosis not present

## 2021-08-15 DIAGNOSIS — B9789 Other viral agents as the cause of diseases classified elsewhere: Secondary | ICD-10-CM

## 2021-08-15 NOTE — Progress Notes (Signed)
Patient has had cough, runny nose, itchy throat and pressure around her eyes for 2 days. No fever

## 2021-08-15 NOTE — Progress Notes (Signed)
..Virtual Visit via Video Note  I connected with Holly Jackson on 08/15/21 at  1:40 PM EDT by a video enabled telemedicine application and verified that I am speaking with the correct person using two identifiers.  Location: Patient: home Provider: clinic  .Marland KitchenParticipating in visit:  Patient: Holly Jackson Provider: Tandy Gaw PA-C   I discussed the limitations of evaluation and management by telemedicine and the availability of in person appointments. The patient expressed understanding and agreed to proceed.  History of Present Illness: Pt is a 40 yo obese female with hx of very well controlled asthma, allergies and chronic cough. She is frustrated because her cough did resolve about 2 weeks ago when she went to [place] and bought a cough syrup, Benylin. 2 days has had dry cough, runny nose, itchy throat and some pressure around eyes. No fever, chills, body aches, GI issues. She has not taken anything. No trouble breathing.   Home test negative for covid.   .. Active Ambulatory Problems    Diagnosis Date Noted   Allergy to penicillin - causes SOB; mild resp distress 03/01/2017   Allergy to sulfa drugs - mod-severe rash 03/01/2017   Generalized anxiety disorder 09/17/2014   Hx of abdominoplasty 09/17/2014   Previous cesarean delivery, antepartum condition or complication 09/22/2014   History of depression 03/01/2017   AMA (advanced maternal age) multigravida 35+, third trimester 03/01/2017   Velamentous insertion of umbilical cord 03/01/2017   Atelectasis of left lung 03/07/2017   Mild persistent asthma 05/23/2017   Single umbilical artery 06/02/2019   Gestational diabetes 06/02/2019   Infertility, female 06/02/2019   History of cholestasis during pregnancy--2015 pregnancy 06/02/2019   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, empty 2nd sac at 9 weeks 06/02/2019   Encounter for maternal care for low transverse scar from previous cesarean delivery 06/18/2019   Cesarean  delivery delivered 06/18/2019   Normal postpartum course 06/21/2019   Alteration in comfort associated with postoperative pain 06/21/2019   Incidental lung nodule, > 77mm and < 84mm 06/21/2019   Lumbar degenerative disc disease 12/16/2019   History of cold sores 09/28/2020   Resolved Ambulatory Problems    Diagnosis Date Noted   Cholestasis of pregnancy 09/17/2014   GBS (group B Streptococcus carrier), +RV culture, currently pregnant 09/17/2014   Obesity, unspecified 09/17/2014   Homero Fellers breech presentation 03/01/2017   Placenta succenturiate lobe affecting fetus 03/01/2017   Status post repeat low transverse cesarean section 03/04/2017   Dermatitis 05/23/2017   Recurrent infections 05/23/2017   Perennial and seasonal allergic rhinitis 05/23/2017   Past Medical History:  Diagnosis Date   Anxiety    Asthma    Depression    IBS (irritable bowel syndrome)    Newborn product of in vitro fertilization (IVF) pregnancy    Vaginal Pap smear, abnormal     Observations/Objective: No acute distress Active during video visit washing her daughters hair No active cough heard Normal breathing. No wheezing.    Assessment and Plan: Marland KitchenMarland KitchenMekisha was seen today for sinus problem.  Diagnoses and all orders for this visit:  Viral URI with cough  Viral sinusitis  Suspect viral infection. Not covid with 2 negative test.  Her cough had cleared with cough syrup in [place] called Benylin. I looked it up and benadryl is main ingredient. Cannot rule out some type of allergic cough as well.  Continue singulair.  Add flonase, tylenol cold sinus severe, nasal sinus washes and benadryl syrup could help with cough.  Follow up if  symptoms persist.  Albuterol as needed for SOB/chest tightness.    Follow Up Instructions:    I discussed the assessment and treatment plan with the patient. The patient was provided an opportunity to ask questions and all were answered. The patient agreed with the plan and  demonstrated an understanding of the instructions.   The patient was advised to call back or seek an in-person evaluation if the symptoms worsen or if the condition fails to improve as anticipated.    Tandy Gaw, PA-C

## 2021-09-01 ENCOUNTER — Other Ambulatory Visit: Payer: Self-pay | Admitting: Osteopathic Medicine

## 2021-10-03 ENCOUNTER — Telehealth: Payer: BC Managed Care – PPO | Admitting: Physician Assistant

## 2021-10-03 DIAGNOSIS — U071 COVID-19: Secondary | ICD-10-CM | POA: Diagnosis not present

## 2021-10-03 MED ORDER — BENZONATATE 100 MG PO CAPS: 100.0000 mg | ORAL_CAPSULE | Freq: Three times a day (TID) | ORAL | 0 refills | Status: DC | PRN

## 2021-10-03 MED ORDER — MOLNUPIRAVIR EUA 200MG CAPSULE: 4.0000 | ORAL_CAPSULE | Freq: Two times a day (BID) | ORAL | 0 refills | Status: AC

## 2021-10-03 NOTE — Patient Instructions (Signed)
Holly Jackson, thank you for joining Piedad Climes, PA-C for today's virtual visit.  While this provider is not your primary care provider (PCP), if your PCP is located in our provider database this encounter information will be shared with them immediately following your visit.  Consent: (Patient) Holly Jackson provided verbal consent for this virtual visit at the beginning of the encounter.  Current Medications:  Current Outpatient Medications:    albuterol (VENTOLIN HFA) 108 (90 Base) MCG/ACT inhaler, Inhale 2 puffs into the lungs every 6 (six) hours as needed for wheezing., Disp: 18 g, Rfl: 11   azelastine (ASTELIN) 0.1 % nasal spray, Place 1 spray into both nostrils 2 (two) times daily. Use in each nostril as directed, Disp: 30 mL, Rfl: 12   dicyclomine (BENTYL) 20 MG tablet, TAKE 1 TABLET BY MOUTH THREE TIMES DAILY AS NEEDED FOR  SPASMS  (ABDOMINAL  PAIN), Disp: 90 tablet, Rfl: 0   Doxylamine Succinate, Sleep, (UNISOM PO), Take 1 tablet by mouth as needed. [employer] brand, Disp: , Rfl:    fluticasone (FLONASE) 50 MCG/ACT nasal spray, Place 1 spray into both nostrils daily., Disp: 16 g, Rfl: 2   montelukast (SINGULAIR) 10 MG tablet, Take 1 tablet (10 mg total) by mouth at bedtime., Disp: 30 tablet, Rfl: 5   sertraline (ZOLOFT) 100 MG tablet, Take 1 tablet by mouth once daily, Disp: 30 tablet, Rfl: 0   Medications ordered in this encounter:  No orders of the defined types were placed in this encounter.    *If you need refills on other medications prior to your next appointment, please contact your pharmacy*  Follow-Up: Call back or seek an in-person evaluation if the symptoms worsen or if the condition fails to improve as anticipated.  Other Instructions Please keep well-hydrated and get plenty of rest. Start a saline nasal rinse to flush out your nasal passages. You can use plain Mucinex to help thin congestion. If you have a humidifier, running in the bedroom at night. I want you  to start OTC vitamin D3 1000 units daily, vitamin C 1000 mg daily, and a zinc supplement. Please take prescribed medications as directed.  You have been enrolled in a MyChart symptom monitoring program. Please answer these questions daily so we can keep track of how you are doing.  You were to quarantine for 5 days from onset of your symptoms.  After day 5, if you have had no fever and you are feeling better, you can end quarantine but need to mask for an additional 5 days. After day 5 if you have a fever or are having significant symptoms, please quarantine for full 10 days.  If you note any worsening of symptoms, any significant shortness of breath or any chest pain, please seek ER evaluation ASAP.  Please do not delay care!  COVID-19: What to Do if You Are Sick If you test positive and are an older adult or someone who is at high risk of getting very sick from COVID-19, treatment may be available. Contact a healthcare provider right away after a positive test to determine if you are eligible, even if your symptoms are mild right now. You can also visit a Test to Treat location and, if eligible, receive a prescription from a provider. Don't delay: Treatment must be started within the first few days to be effective. If you have a fever, cough, or other symptoms, you might have COVID-19. Most people have mild illness and are able to recover at home. If  you are sick: Keep track of your symptoms. If you have an emergency warning sign (including trouble breathing), call 911. Steps to help prevent the spread of COVID-19 if you are sick If you are sick with COVID-19 or think you might have COVID-19, follow the steps below to care for yourself and to help protect other people in your home and community. Stay home except to get medical care Stay home. Most people with COVID-19 have mild illness and can recover at home without medical care. Do not leave your home, except to get medical care. Do not visit  public areas and do not go to places where you are unable to wear a mask. Take care of yourself. Get rest and stay hydrated. Take over-the-counter medicines, such as acetaminophen, to help you feel better. Stay in touch with your doctor. Call before you get medical care. Be sure to get care if you have trouble breathing, or have any other emergency warning signs, or if you think it is an emergency. Avoid public transportation, ride-sharing, or taxis if possible. Get tested If you have symptoms of COVID-19, get tested. While waiting for test results, stay away from others, including staying apart from those living in your household. Get tested as soon as possible after your symptoms start. Treatments may be available for people with COVID-19 who are at risk for becoming very sick. Don't delay: Treatment must be started early to be effective--some treatments must begin within 5 days of your first symptoms. Contact your healthcare provider right away if your test result is positive to determine if you are eligible. Self-tests are one of several options for testing for the virus that causes COVID-19 and may be more convenient than laboratory-based tests and point-of-care tests. Ask your healthcare provider or your local health department if you need help interpreting your test results. You can visit your state, tribal, local, and territorial health department's website to look for the latest local information on testing sites. Separate yourself from other people As much as possible, stay in a specific room and away from other people and pets in your home. If possible, you should use a separate bathroom. If you need to be around other people or animals in or outside of the home, wear a well-fitting mask. Tell your close contacts that they may have been exposed to COVID-19. An infected person can spread COVID-19 starting 48 hours (or 2 days) before the person has any symptoms or tests positive. By letting your  close contacts know they may have been exposed to COVID-19, you are helping to protect everyone. See COVID-19 and Animals if you have questions about pets. If you are diagnosed with COVID-19, someone from the health department may call you. Answer the call to slow the spread. Monitor your symptoms Symptoms of COVID-19 include fever, cough, or other symptoms. Follow care instructions from your healthcare provider and local health department. Your local health authorities may give instructions on checking your symptoms and reporting information. When to seek emergency medical attention Look for emergency warning signs* for COVID-19. If someone is showing any of these signs, seek emergency medical care immediately: Trouble breathing Persistent pain or pressure in the chest New confusion Inability to wake or stay awake Pale, gray, or blue-colored skin, lips, or nail beds, depending on skin tone *This list is not all possible symptoms. Please call your medical provider for any other symptoms that are severe or concerning to you. Call 911 or call ahead to your local emergency facility:  Notify the operator that you are seeking care for someone who has or may have COVID-19. Call ahead before visiting your doctor Call ahead. Many medical visits for routine care are being postponed or done by phone or telemedicine. If you have a medical appointment that cannot be postponed, call your doctor's office, and tell them you have or may have COVID-19. This will help the office protect themselves and other patients. If you are sick, wear a well-fitting mask You should wear a mask if you must be around other people or animals, including pets (even at home). Wear a mask with the best fit, protection, and comfort for you. You don't need to wear the mask if you are alone. If you can't put on a mask (because of trouble breathing, for example), cover your coughs and sneezes in some other way. Try to stay at least 6  feet away from other people. This will help protect the people around you. Masks should not be placed on young children under age 7 years, anyone who has trouble breathing, or anyone who is not able to remove the mask without help. Cover your coughs and sneezes Cover your mouth and nose with a tissue when you cough or sneeze. Throw away used tissues in a lined trash can. Immediately wash your hands with soap and water for at least 20 seconds. If soap and water are not available, clean your hands with an alcohol-based hand sanitizer that contains at least 60% alcohol. Clean your hands often Wash your hands often with soap and water for at least 20 seconds. This is especially important after blowing your nose, coughing, or sneezing; going to the bathroom; and before eating or preparing food. Use hand sanitizer if soap and water are not available. Use an alcohol-based hand sanitizer with at least 60% alcohol, covering all surfaces of your hands and rubbing them together until they feel dry. Soap and water are the best option, especially if hands are visibly dirty. Avoid touching your eyes, nose, and mouth with unwashed hands. Handwashing Tips Avoid sharing personal household items Do not share dishes, drinking glasses, cups, eating utensils, towels, or bedding with other people in your home. Wash these items thoroughly after using them with soap and water or put in the dishwasher. Clean surfaces in your home regularly Clean and disinfect high-touch surfaces (for example, doorknobs, tables, handles, light switches, and countertops) in your "sick room" and bathroom. In shared spaces, you should clean and disinfect surfaces and items after each use by the person who is ill. If you are sick and cannot clean, a caregiver or other person should only clean and disinfect the area around you (such as your bedroom and bathroom) on an as needed basis. Your caregiver/other person should wait as long as possible  (at least several hours) and wear a mask before entering, cleaning, and disinfecting shared spaces that you use. Clean and disinfect areas that may have blood, stool, or body fluids on them. Use household cleaners and disinfectants. Clean visible dirty surfaces with household cleaners containing soap or detergent. Then, use a household disinfectant. Use a product from [employer] List N: Disinfectants for Coronavirus (COVID-19). Be sure to follow the instructions on the label to ensure safe and effective use of the product. Many products recommend keeping the surface wet with a disinfectant for a certain period of time (look at "contact time" on the product label). You may also need to wear personal protective equipment, such as gloves, depending on the directions  on the product label. Immediately after disinfecting, wash your hands with soap and water for 20 seconds. For completed guidance on cleaning and disinfecting your home, visit Complete Disinfection Guidance. Take steps to improve ventilation at home Improve ventilation (air flow) at home to help prevent from spreading COVID-19 to other people in your household. Clear out COVID-19 virus particles in the air by opening windows, using air filters, and turning on fans in your home. Use this interactive tool to learn how to improve air flow in your home. When you can be around others after being sick with COVID-19 Deciding when you can be around others is different for different situations. Find out when you can safely end home isolation. For any additional questions about your care, contact your healthcare provider or state or local health department. 03/14/2021 Content source: [facility] for Immunization and Respiratory Diseases ([facility]), Division of Viral Diseases This information is not intended to replace advice given to you by your health care provider. Make sure you discuss any questions you have with your health care provider. Document  Revised: 04/27/2021 Document Reviewed: 04/27/2021 Elsevier Patient Education  2022 [employer].      If you have been instructed to have an in-person evaluation today at a local Urgent Care facility, please use the link below. It will take you to a list of all of our available [facility], including address, phone number and hours of operation. Please do not delay care.  [facility]  If you or a family member do not have a primary care provider, use the link below to schedule a visit and establish care. When you choose a [facility] primary care physician or advanced practice provider, you gain a long-term partner in health. Find a Primary Care Provider  Learn more about [facility]'s in-office and virtual care options: [facility] - Get Care Now

## 2021-10-03 NOTE — Progress Notes (Signed)
Virtual Visit Consent   Holly Jackson, you are scheduled for a virtual visit with a [facility] provider today.     Just as with appointments in the office, your consent must be obtained to participate.  Your consent will be active for this visit and any virtual visit you may have with one of our providers in the next 365 days.     If you have a MyChart account, a copy of this consent can be sent to you electronically.  All virtual visits are billed to your insurance company just like a traditional visit in the office.    As this is a virtual visit, video technology does not allow for your provider to perform a traditional examination.  This may limit your provider's ability to fully assess your condition.  If your provider identifies any concerns that need to be evaluated in person or the need to arrange testing (such as labs, EKG, etc.), we will make arrangements to do so.     Although advances in technology are sophisticated, we cannot ensure that it will always work on either your end or our end.  If the connection with a video visit is poor, the visit may have to be switched to a telephone visit.  With either a video or telephone visit, we are not always able to ensure that we have a secure connection.     I need to obtain your verbal consent now.   Are you willing to proceed with your visit today?    Holly Jackson has provided verbal consent on 10/03/2021 for a virtual visit (video or telephone).   Piedad Climes, [place]   Date: 10/03/2021 3:44 PM   Virtual Visit via Video Note   I, Piedad Climes, connected with  Holly Jackson  ([id number], 01-May-1981) on 10/03/21 at  3:30 PM EDT by a video-enabled telemedicine application and verified that I am speaking with the correct person using two identifiers.  Location: Patient: Virtual Visit Location Patient: Home Provider: Virtual Visit Location Provider: Home Office   I discussed the limitations of evaluation and management by  telemedicine and the availability of in person appointments. The patient expressed understanding and agreed to proceed.    History of Present Illness: Holly Jackson is a 40 y.o. who identifies as a female who was assigned female at birth, and is being seen today for COVID-19. Notes symptoms starting on Thursday evening. Patient with history of asthma and ongoing cough. Is now experiencing significant head congestion, fatigue, sore throat. Has had a fever with Tmax 102 since yesterday. Tested positive this morning. Has not had to use albuterol inhaler. Has taken Mucinex and Robitussin OTC.     HPI: HPI  Problems:  Patient Active Problem List   Diagnosis Date Noted   History of cold sores 09/28/2020   Lumbar degenerative disc disease 12/16/2019   Normal postpartum course 06/21/2019   Alteration in comfort associated with postoperative pain 06/21/2019   Incidental lung nodule, > 37mm and < 2mm 06/21/2019   Encounter for maternal care for low transverse scar from previous cesarean delivery 06/18/2019   Cesarean delivery delivered 06/18/2019   Single umbilical artery 06/02/2019   Gestational diabetes 06/02/2019   Infertility, female 06/02/2019   History of cholestasis during pregnancy--2015 pregnancy 06/02/2019   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, empty 2nd sac at 9 weeks 06/02/2019   Mild persistent asthma 05/23/2017   Atelectasis of left lung 03/07/2017   Allergy to penicillin - causes  SOB; mild resp distress 03/01/2017   Allergy to sulfa drugs - mod-severe rash 03/01/2017   History of depression 03/01/2017   AMA (advanced maternal age) multigravida 35+, third trimester 03/01/2017   Velamentous insertion of umbilical cord 03/01/2017   Previous cesarean delivery, antepartum condition or complication 09/22/2014   Generalized anxiety disorder 09/17/2014   Hx of abdominoplasty 09/17/2014    Allergies:  Allergies  Allergen Reactions   Penicillins Shortness Of Breath     Has patient had a PCN reaction causing immediate rash, facial/tongue/throat swelling, SOB or lightheadedness with hypotension: Yes Has patient had a PCN reaction causing severe rash involving mucus membranes or skin necrosis: No Has patient had a PCN reaction that required hospitalization No Has patient had a PCN reaction occurring within the last 10 years: No If all of the above answers are "NO", then may proceed with Cephalosporin use.     Sulfa Antibiotics Rash   Sulfasalazine Rash   Medications:  Current Outpatient Medications:    benzonatate (TESSALON) 100 MG capsule, Take 1 capsule (100 mg total) by mouth 3 (three) times daily as needed for cough., Disp: 30 capsule, Rfl: 0   molnupiravir EUA (LAGEVRIO) 200 mg CAPS capsule, Take 4 capsules (800 mg total) by mouth 2 (two) times daily for 5 days., Disp: 40 capsule, Rfl: 0   albuterol (VENTOLIN HFA) 108 (90 Base) MCG/ACT inhaler, Inhale 2 puffs into the lungs every 6 (six) hours as needed for wheezing., Disp: 18 g, Rfl: 11   dicyclomine (BENTYL) 20 MG tablet, TAKE 1 TABLET BY MOUTH THREE TIMES DAILY AS NEEDED FOR  SPASMS  (ABDOMINAL  PAIN), Disp: 90 tablet, Rfl: 0   Doxylamine Succinate, Sleep, (UNISOM PO), Take 1 tablet by mouth as needed. [employer] brand, Disp: , Rfl:    fluticasone (FLONASE) 50 MCG/ACT nasal spray, Place 1 spray into both nostrils daily., Disp: 16 g, Rfl: 2   montelukast (SINGULAIR) 10 MG tablet, Take 1 tablet (10 mg total) by mouth at bedtime., Disp: 30 tablet, Rfl: 5   sertraline (ZOLOFT) 100 MG tablet, Take 1 tablet by mouth once daily, Disp: 30 tablet, Rfl: 0  Observations/Objective: Patient is well-developed, well-nourished in no acute distress.  Resting comfortably on bed at home.  Head is normocephalic, atraumatic.  No labored breathing. Speech is clear and coherent with logical content.  Patient is alert and oriented at baseline.   Assessment and Plan: 1. COVID-19 - benzonatate (TESSALON) 100 MG capsule;  Take 1 capsule (100 mg total) by mouth 3 (three) times daily as needed for cough.  Dispense: 30 capsule; Refill: 0 - MyChart COVID-19 home monitoring program; Future - molnupiravir EUA (LAGEVRIO) 200 mg CAPS capsule; Take 4 capsules (800 mg total) by mouth 2 (two) times daily for 5 days.  Dispense: 40 capsule; Refill: 0 Patient with multiple risk factors for complicated course of illness. Discussed risks/benefits of antiviral medications including most common potential ADRs. Patient voiced understanding and would like to proceed with antiviral medication. They are candidate for molnupiravir. Rx sent to pharmacy. Supportive measures, OTC medications and vitamin regimen reviewed. Tessalon per orders. Patient has been enrolled in a MyChart COVID symptom monitoring program. Anne Shutter reviewed in detail. Strict ER precautions discussed with patient.   Follow Up Instructions: I discussed the assessment and treatment plan with the patient. The patient was provided an opportunity to ask questions and all were answered. The patient agreed with the plan and demonstrated an understanding of the instructions.  A copy of instructions were sent  to the patient via MyChart unless otherwise noted below.   The patient was advised to call back or seek an in-person evaluation if the symptoms worsen or if the condition fails to improve as anticipated.  Time:  I spent 12 minutes with the patient via telehealth technology discussing the above problems/concerns.    Piedad Climes, PA-C

## 2021-10-11 ENCOUNTER — Other Ambulatory Visit: Payer: Self-pay | Admitting: Osteopathic Medicine

## 2021-10-20 ENCOUNTER — Other Ambulatory Visit: Payer: Self-pay | Admitting: Osteopathic Medicine

## 2021-10-23 ENCOUNTER — Other Ambulatory Visit: Payer: Self-pay | Admitting: Osteopathic Medicine

## 2021-11-01 DIAGNOSIS — R053 Chronic cough: Secondary | ICD-10-CM | POA: Diagnosis not present

## 2021-11-02 ENCOUNTER — Ambulatory Visit (INDEPENDENT_AMBULATORY_CARE_PROVIDER_SITE_OTHER): Payer: BC Managed Care – PPO | Admitting: Family Medicine

## 2021-11-02 ENCOUNTER — Encounter: Payer: Self-pay | Admitting: Family Medicine

## 2021-11-02 ENCOUNTER — Other Ambulatory Visit: Payer: Self-pay

## 2021-11-02 VITALS — BP 125/78 | HR 91 | Temp 98.7°F | Wt 188.1 lb

## 2021-11-02 DIAGNOSIS — Z131 Encounter for screening for diabetes mellitus: Secondary | ICD-10-CM | POA: Diagnosis not present

## 2021-11-02 DIAGNOSIS — Z23 Encounter for immunization: Secondary | ICD-10-CM | POA: Diagnosis not present

## 2021-11-02 DIAGNOSIS — Z0000 Encounter for general adult medical examination without abnormal findings: Secondary | ICD-10-CM | POA: Diagnosis not present

## 2021-11-02 DIAGNOSIS — Z298 Encounter for other specified prophylactic measures: Secondary | ICD-10-CM | POA: Diagnosis not present

## 2021-11-02 DIAGNOSIS — Z2989 Encounter for other specified prophylactic measures: Secondary | ICD-10-CM

## 2021-11-02 DIAGNOSIS — Z13220 Encounter for screening for lipoid disorders: Secondary | ICD-10-CM | POA: Diagnosis not present

## 2021-11-02 DIAGNOSIS — Z8619 Personal history of other infectious and parasitic diseases: Secondary | ICD-10-CM | POA: Diagnosis not present

## 2021-11-02 MED ORDER — ATOVAQUONE-PROGUANIL HCL 250-100 MG PO TABS: 1.0000 | ORAL_TABLET | Freq: Every day | ORAL | 0 refills | Status: DC

## 2021-11-02 MED ORDER — AZITHROMYCIN 250 MG PO TABS: 1000.0000 mg | ORAL_TABLET | Freq: Once | ORAL | 0 refills | Status: AC

## 2021-11-02 NOTE — Patient Instructions (Signed)
[email or web address]

## 2021-11-02 NOTE — Progress Notes (Signed)
BP 125/78 (BP Location: Left Arm, Patient Position: Sitting, Cuff Size: Normal)   Pulse 91   Temp 98.7 F (37.1 C) (Oral)   Wt 188 lb 1.9 oz (85.3 kg)   SpO2 100%   BMI 34.41 kg/m    Subjective:    Patient ID: Holly Jackson, female    DOB: [birth year], 40 y.o.   MRN: [id number]  HPI: Holly Jackson is a 40 y.o. female presenting on 11/02/2021 for comprehensive medical examination. Current medical complaints include: none  She currently lives with: husband and children Interim Problems from her last visit: no   She reports regular vision exams q1-5y: yes She reports regular dental exams q 24m: no Her diet consists of: meats, vegetables, limit starches She endorses exercise and/or activity of: at 3x/week, cardio and weights, bike She works at: Psychiatrist at a [employer]  She endorses ETOH use. - two servings per week She denies nictoine use. She denies illegal substance use.    She reports no menstrual periods - Mirena Current menopausal symptoms: no She is currently  sexually active with husband She denies  concerns today about STI Contraception choices are: Mirena  She denies concerns about skin changes today. She denies concerns about bowel changes today. - hx of IBS She denies concerns about bladder changes today. - somewhat polyuria  Depression Screen done today and results listed below:  Depression screen [facility] 2/9 11/02/2021 09/26/2020 11/11/2019 09/09/2019 04/07/2019  Decreased Interest 0 0 0 0 0  Down, Depressed, Hopeless 0 0 0 0 0  PHQ - 2 Score 0 0 0 0 0  Altered sleeping - - - 3 -  Tired, decreased energy - - - 1 -  Change in appetite - - - 3 -  Feeling bad or failure about yourself  - - - 0 -  Trouble concentrating - - - 2 -  Moving slowly or fidgety/restless - - - 2 -  Suicidal thoughts - - - 0 -  PHQ-9 Score - - - 11 -  Difficult doing work/chores - - - Extremely dIfficult -    She does not have a history of falls.       Past Medical History:   Past Medical History:  Diagnosis Date   Anxiety    Asthma    Cholestasis of pregnancy    Depression    Gestational diabetes    IBS (irritable bowel syndrome)    Newborn product of in vitro fertilization (IVF) pregnancy    Vaginal Pap smear, abnormal    pos HPV   Velamentous insertion of umbilical cord     Surgical History:  Past Surgical History:  Procedure Laterality Date   ABDOMINOPLASTY     CESAREAN SECTION N/A 09/21/2014   Procedure: CESAREAN SECTION;  Surgeon: Konrad Felix, MD;  Location: [facility];  Service: Obstetrics;  Laterality: N/A;   CESAREAN SECTION N/A 03/04/2017   Procedure: CESAREAN SECTION;  Surgeon: Silverio Lay, MD;  Location: [facility];  Service: Obstetrics;  Laterality: N/A;   CESAREAN SECTION N/A 06/18/2019   Procedure: Repeat CESAREAN SECTION;  Surgeon: Silverio Lay, MD;  Location: [facility];  Service: Obstetrics;  Laterality: N/A;  Nigel Bridgeman Assisting    Medications:  Current Outpatient Medications on File Prior to Visit  Medication Sig   albuterol (VENTOLIN HFA) 108 (90 Base) MCG/ACT inhaler Inhale 2 puffs into the lungs every 6 (six) hours as needed for wheezing.   benzonatate (TESSALON) 100 MG capsule Take 1 capsule (  100 mg total) by mouth 3 (three) times daily as needed for cough.   dicyclomine (BENTYL) 20 MG tablet TAKE 1 TABLET BY MOUTH THREE TIMES DAILY AS NEEDED FOR  SPASMS  (ABDOMINAL  PAIN)   Doxylamine Succinate, Sleep, (UNISOM PO) Take 1 tablet by mouth as needed. [employer] brand   sertraline (ZOLOFT) 100 MG tablet Take 1 tablet (100 mg total) by mouth daily. NO REFILLS. NEEDS TO TRANSITION TO A NEW PCP.   fluticasone (FLONASE) 50 MCG/ACT nasal spray Place 1 spray into both nostrils daily.   montelukast (SINGULAIR) 10 MG tablet Take 1 tablet (10 mg total) by mouth at bedtime.   No current facility-administered medications on file prior to visit.    Allergies:  Allergies  Allergen Reactions   Penicillins Shortness Of Breath     Has patient had a PCN reaction causing immediate rash, facial/tongue/throat swelling, SOB or lightheadedness with hypotension: Yes Has patient had a PCN reaction causing severe rash involving mucus membranes or skin necrosis: No Has patient had a PCN reaction that required hospitalization No Has patient had a PCN reaction occurring within the last 10 years: No If all of the above answers are "NO", then may proceed with Cephalosporin use.     Sulfa Antibiotics Rash   Sulfasalazine Rash    Social History:  Social History   Socioeconomic History   Marital status: Married    Spouse name: Not on file   Number of children: 2   Years of education: Not on file   Highest education level: Not on file  Occupational History   Occupation: RISK ANALYST    Employer: [employer]  Tobacco Use   Smoking status: Former    Years: 3.00    Types: Cigarettes    Quit date: 2007    Years since quitting: 15.8   Smokeless tobacco: Never   Tobacco comments:    Social smoking 2-3 cigarettes per week.  Vaping Use   Vaping Use: Never used  Substance and Sexual Activity   Alcohol use: No   Drug use: No   Sexual activity: Yes  Other Topics Concern   Not on file  Social History Narrative   Not on file   Social Determinants of Health   Financial Resource Strain: Not on file  Food Insecurity: Not on file  Transportation Needs: Not on file  Physical Activity: Not on file  Stress: Not on file  Social Connections: Not on file  Intimate Partner Violence: Not on file   Social History   Tobacco Use  Smoking Status Former   Years: 3.00   Types: Cigarettes   Quit date: 2007   Years since quitting: 15.8  Smokeless Tobacco Never  Tobacco Comments   Social smoking 2-3 cigarettes per week.   Social History   Substance and Sexual Activity  Alcohol Use No    Family History:  Family History  Problem Relation Age of Onset   Miscarriages / Stillbirths Sister    Stroke Mother    Stroke Father     Cerebral aneurysm Father    Allergic rhinitis Neg Hx    Angioedema Neg Hx    Asthma Neg Hx    Eczema Neg Hx    Immunodeficiency Neg Hx    Urticaria Neg Hx     Past medical history, surgical history, medications, allergies, family history and social history reviewed with patient today and changes made to appropriate areas of the chart.   All ROS negative except what is  listed above and in the HPI.      Objective:    BP 125/78 (BP Location: Left Arm, Patient Position: Sitting, Cuff Size: Normal)   Pulse 91   Temp 98.7 F (37.1 C) (Oral)   Wt 188 lb 1.9 oz (85.3 kg)   SpO2 100%   BMI 34.41 kg/m   Wt Readings from Last 3 Encounters:  11/02/21 188 lb 1.9 oz (85.3 kg)  08/15/21 184 lb 8 oz (83.7 kg)  06/21/21 184 lb 9.6 oz (83.7 kg)    Physical Exam Vitals reviewed.  Constitutional:      Appearance: Normal appearance.  HENT:     Head: Normocephalic and atraumatic.     Right Ear: Tympanic membrane normal.     Left Ear: Tympanic membrane normal.     Nose: Nose normal.     Mouth/Throat:     Mouth: Mucous membranes are moist.     Pharynx: Oropharynx is clear.  Eyes:     Extraocular Movements: Extraocular movements intact.     Conjunctiva/sclera: Conjunctivae normal.     Pupils: Pupils are equal, round, and reactive to light.  Cardiovascular:     Rate and Rhythm: Normal rate and regular rhythm.     Pulses: Normal pulses.     Heart sounds: Normal heart sounds.  Pulmonary:     Effort: Pulmonary effort is normal.     Breath sounds: Normal breath sounds.  Abdominal:     General: Abdomen is flat. Bowel sounds are normal. There is no distension.     Palpations: Abdomen is soft. There is no mass.     Tenderness: There is no abdominal tenderness. There is no right CVA tenderness, left CVA tenderness, guarding or rebound.     Hernia: No hernia is present.  Musculoskeletal:        General: Normal range of motion.     Cervical back: Normal range of motion and neck supple.   Skin:    General: Skin is warm and dry.     Capillary Refill: Capillary refill takes less than 2 seconds.  Neurological:     General: No focal deficit present.     Mental Status: She is alert and oriented to person, place, and time. Mental status is at baseline.  Psychiatric:        Mood and Affect: Mood normal.        Behavior: Behavior normal.        Thought Content: Thought content normal.        Judgment: Judgment normal.    Results for orders placed or performed in visit on 05/02/21  Clostridium difficile Toxin B, Qualitative, Real-Time PCR  Result Value Ref Range   Toxigenic C. Difficile by PCR NOT DETECTED NOT DETECTED      Assessment & Plan:   Problem List Items Addressed This Visit   None Visit Diagnoses     Need for influenza vaccination    -  Primary   Relevant Orders   Flu Vaccine QUAD 6+ mos PF IM (Fluarix Quad PF) (Completed)   Need for malaria prophylaxis       Relevant Medications   atovaquone-proguanil (MALARONE) 250-100 MG TABS tablet   H/O traveler's diarrhea       Relevant Medications   azithromycin (ZITHROMAX) 250 MG tablet   Annual physical exam       Relevant Orders   CBC with Differential/Platelet   Comprehensive metabolic panel   Lipid panel   TSH   Hemoglobin A1c  Screening for diabetes mellitus       Relevant Orders   Hemoglobin A1c   Screening, lipid       Relevant Orders   Lipid panel          LABORATORY TESTING:  - Health maintenance labs ordered today as discussed above.           CBC, CMP, LIPIDS          TSH           A1c  - STI testing: deferred - Pap smear:  UTD - 2021 NILM, -HPV    IMMUNIZATIONS:   - Tdap: Tetanus vaccination status reviewed: last tetanus booster within 10 years. - Influenza: Administered today - Pneumovax: Up to date - Prevnar: Not applicable - HPV: Up to date - Shingrix vaccine: Not applicable - COVID-19: Up to date  SCREENING: - Mammogram: Not applicable - Bone Density: Not  applicable - Colonoscopy: Not applicable  - AAA Screening: Not applicable  -Hearing Test: Not applicable  -Spirometry: Not applicable  - Lung Cancer Screening: Not applicable    PATIENT COUNSELING:   Advised to take 1 mg of folate supplement per day if capable of pregnancy.   Sexuality: Discussed sexually transmitted diseases, partner selection, use of condoms, avoidance of unintended pregnancy, and contraceptive alternatives.     I discussed with the patient that most people either abstain from alcohol or drink within safe limits (<=14/week and <=4 drinks/occasion for males, <=7/weeks and <= 3 drinks/occasion for females) and that the risk for alcohol disorders and other health effects rises proportionally with the number of drinks per week and how often a drinker exceeds daily limits.  Discussed cessation/primary prevention of drug use and availability of treatment for abuse.   Diet: Encouraged to adjust caloric intake to maintain or achieve ideal body weight, to reduce intake of dietary saturated fat and total fat, to limit sodium intake by avoiding high sodium foods and not adding table salt, and to maintain adequate dietary potassium and calcium preferably from fresh fruits, vegetables, and low-fat dairy products. Encouraged vitamin D 1000 units and Calcium 1300mg  or 4 servings of dairy a day.  Emphasized the importance of regular exercise.  Injury prevention: Discussed safety belts, safety helmets, smoke detector, smoking near bedding or upholstery.   Dental health: Discussed importance of regular tooth brushing, flossing, and dental visits.  Follow up plan:  Doing well overall. Sending in Malaria prophylaxis a dose of azithromycin for traveler's diarrhea if needed during her trip next month. Gave her link to recommended travel guidelines/vaccines, etc for her to review and follow-up with health department prior to leaving.   Follow-up as needed. Will need to establish care with  new provider.   Lollie Marrow, DNP, FNP-C

## 2021-11-03 LAB — CBC WITH DIFFERENTIAL/PLATELET
Absolute Monocytes: 451 cells/uL (ref 200–950)
Basophils Absolute: 49 cells/uL (ref 0–200)
Basophils Relative: 0.6 %
Eosinophils Absolute: 107 cells/uL (ref 15–500)
Eosinophils Relative: 1.3 %
HCT: 38.8 % (ref 35.0–45.0)
Hemoglobin: 12.4 g/dL (ref 11.7–15.5)
Lymphs Abs: 2017 cells/uL (ref 850–3900)
MCH: 25 pg — ABNORMAL LOW (ref 27.0–33.0)
MCHC: 32 g/dL (ref 32.0–36.0)
MCV: 78.2 fL — ABNORMAL LOW (ref 80.0–100.0)
MPV: 9 fL (ref 7.5–12.5)
Monocytes Relative: 5.5 %
Neutro Abs: 5576 cells/uL (ref 1500–7800)
Neutrophils Relative %: 68 %
Platelets: 311 10*3/uL (ref 140–400)
RBC: 4.96 10*6/uL (ref 3.80–5.10)
RDW: 13.5 % (ref 11.0–15.0)
Total Lymphocyte: 24.6 %
WBC: 8.2 10*3/uL (ref 3.8–10.8)

## 2021-11-03 LAB — LIPID PANEL
Cholesterol: 183 mg/dL (ref <200)
HDL: 36 mg/dL — ABNORMAL LOW (ref >=50)
LDL Cholesterol (Calc): 113 mg/dL (calc) — ABNORMAL HIGH
Non-HDL Cholesterol (Calc): 147 mg/dL (calc) — ABNORMAL HIGH (ref <130)
Total CHOL/HDL Ratio: 5.1 (calc) — ABNORMAL HIGH (ref <5.0)
Triglycerides: 223 mg/dL — ABNORMAL HIGH (ref <150)

## 2021-11-03 LAB — COMPREHENSIVE METABOLIC PANEL
AG Ratio: 1.6 (calc) (ref 1.0–2.5)
ALT: 15 U/L (ref 6–29)
AST: 18 U/L (ref 10–30)
Albumin: 4.6 g/dL (ref 3.6–5.1)
Alkaline phosphatase (APISO): 81 U/L (ref 31–125)
BUN: 14 mg/dL (ref 7–25)
CO2: 27 mmol/L (ref 20–32)
Calcium: 9.3 mg/dL (ref 8.6–10.2)
Chloride: 102 mmol/L (ref 98–110)
Creat: 0.63 mg/dL (ref 0.50–0.99)
Globulin: 2.9 g/dL (calc) (ref 1.9–3.7)
Glucose, Bld: 99 mg/dL (ref 65–99)
Potassium: 3.8 mmol/L (ref 3.5–5.3)
Sodium: 136 mmol/L (ref 135–146)
Total Bilirubin: 0.3 mg/dL (ref 0.2–1.2)
Total Protein: 7.5 g/dL (ref 6.1–8.1)

## 2021-11-03 LAB — TSH: TSH: 1.97 mIU/L

## 2021-11-03 LAB — HEMOGLOBIN A1C
Hgb A1c MFr Bld: 5.5 % of total Hgb (ref <5.7)
Mean Plasma Glucose: 111 mg/dL
eAG (mmol/L): 6.2 mmol/L

## 2021-11-14 ENCOUNTER — Ambulatory Visit: Payer: BLUE CROSS/BLUE SHIELD | Admitting: Allergy

## 2021-11-14 NOTE — Progress Notes (Deleted)
New Patient Note  RE: Holly Jackson MRN: [id number] DOB: 07-05-1981 Date of Office Visit: 11/14/2021  Consult requested by: Holly Nielsen, DO Primary care provider: Sunnie Nielsen, DO  Chief Complaint: No chief complaint on file.  History of Present Illness: I had the pleasure of seeing Holly Jackson for initial evaluation at the [facility] on 11/14/2021. She is a 40 y.o. female, who is referred here by Holly Nielsen, DO for the evaluation of ***.  ***  11/01/2021 ENT visit: "Persistent cough. See history of present illness. Non-smoker. No chest x-rays. EXAM shows normal oral cavity oropharynx. Indirect laryngoscopy shows normal hypopharynx. Could not visualize vocal cords. PLAN: We discussed the likelihood of reflux contributing to her symptoms. We will try diagnostic trial of acid suppression. She will keep in touch by portal regarding response to therapy."  06/21/2021 pulm visit: "Chronic cough with history of allergies. - will have her try nasal irrigation, flonase, singulair, and azelastine - she can use benzonatate during the day and hycodan at night to help with cough - salt water gargle daily - sip water, sugarless candy, avoid cough or throat clearing - 1 tsp honey to help reduce cough and throat irritation - explained recover time from this type of cough can be several weeks - if no improvement with above regimen, then will need further testings (labs, PFT, CT chest/sinus)   Asthma. - hasn't improved with inhaler therapy - advair might be contributing to upper airway irritation and cough - stop advair - prn albuterol" Assessment and Plan: Holly Jackson is a 40 y.o. female with: No problem-specific Assessment & Plan notes found for this encounter.  No follow-ups on file.  No orders of the defined types were placed in this encounter.  Lab Orders  No laboratory test(s) ordered today    Other allergy screening: Asthma: {Blank  single:19197::"yes","no"} Rhino conjunctivitis: {Blank single:19197::"yes","no"} Food allergy: {Blank single:19197::"yes","no"} Medication allergy: {Blank single:19197::"yes","no"} Hymenoptera allergy: {Blank single:19197::"yes","no"} Urticaria: {Blank single:19197::"yes","no"} Eczema:{Blank single:19197::"yes","no"} History of recurrent infections suggestive of immunodeficency: {Blank single:19197::"yes","no"}  Diagnostics: Spirometry:  Tracings reviewed. Her effort: {Blank single:19197::"Good reproducible efforts.","It was hard to get consistent efforts and there is a question as to whether this reflects a maximal maneuver.","Poor effort, data can not be interpreted."} FVC: ***L FEV1: ***L, ***% predicted FEV1/FVC ratio: ***% Interpretation: {Blank single:19197::"Spirometry consistent with mild obstructive disease","Spirometry consistent with moderate obstructive disease","Spirometry consistent with severe obstructive disease","Spirometry consistent with possible restrictive disease","Spirometry consistent with mixed obstructive and restrictive disease","Spirometry uninterpretable due to technique","Spirometry consistent with normal pattern","No overt abnormalities noted given today's efforts"}.  Please see scanned spirometry results for details.  Skin Testing: {Blank single:19197::"Select foods","Environmental allergy panel","Environmental allergy panel and select foods","Food allergy panel","None","Deferred due to recent antihistamines use"}. *** Results discussed with patient/family.   Past Medical History: Patient Active Problem List   Diagnosis Date Noted   History of cold sores 09/28/2020   Lumbar degenerative disc disease 12/16/2019   Normal postpartum course 06/21/2019   Alteration in comfort associated with postoperative pain 06/21/2019   Incidental lung nodule, > 48mm and < 52mm 06/21/2019   Encounter for maternal care for low transverse scar from previous cesarean delivery  06/18/2019   Cesarean delivery delivered 06/18/2019   Single umbilical artery 06/02/2019   Gestational diabetes 06/02/2019   Infertility, female 06/02/2019   History of cholestasis during pregnancy--2015 pregnancy 06/02/2019   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, empty 2nd sac at 9 weeks 06/02/2019   Mild persistent asthma 05/23/2017  Atelectasis of left lung 03/07/2017   Allergy to penicillin - causes SOB; mild resp distress 03/01/2017   Allergy to sulfa drugs - mod-severe rash 03/01/2017   History of depression 03/01/2017   AMA (advanced maternal age) multigravida 35+, third trimester 03/01/2017   Velamentous insertion of umbilical cord 03/01/2017   Previous cesarean delivery, antepartum condition or complication 09/22/2014   Generalized anxiety disorder 09/17/2014   Hx of abdominoplasty 09/17/2014   Past Medical History:  Diagnosis Date   Anxiety    Asthma    Cholestasis of pregnancy    Depression    Gestational diabetes    IBS (irritable bowel syndrome)    Newborn product of in vitro fertilization (IVF) pregnancy    Vaginal Pap smear, abnormal    pos HPV   Velamentous insertion of umbilical cord    Past Surgical History: Past Surgical History:  Procedure Laterality Date   ABDOMINOPLASTY     CESAREAN SECTION N/A 09/21/2014   Procedure: CESAREAN SECTION;  Surgeon: Konrad Felix, MD;  Location: [facility];  Service: Obstetrics;  Laterality: N/A;   CESAREAN SECTION N/A 03/04/2017   Procedure: CESAREAN SECTION;  Surgeon: Silverio Lay, MD;  Location: [facility] BIRTHING SUIT[facility];  Service: Obstetrics;  Laterality: N/A;   CESAREAN SECTION N/A 06/18/2019   Procedure: Repeat CESAREAN SECTION;  Surgeon: Silverio Lay, MD;  Location: [facility];  Service: Obstetrics;  Laterality: N/A;  Nigel Bridgeman Assisting   Medication List:  Current Outpatient Medications  Medication Sig Dispense Refill   albuterol (VENTOLIN HFA) 108 (90 Base) MCG/ACT inhaler Inhale 2 puffs into  the lungs every 6 (six) hours as needed for wheezing. 18 g 11   atovaquone-proguanil (MALARONE) 250-100 MG TABS tablet Take 1 tablet by mouth daily. One tab PO daily starting 2 days before travel and finish 7 days after travel. 45 tablet 0   benzonatate (TESSALON) 100 MG capsule Take 1 capsule (100 mg total) by mouth 3 (three) times daily as needed for cough. 30 capsule 0   dicyclomine (BENTYL) 20 MG tablet TAKE 1 TABLET BY MOUTH THREE TIMES DAILY AS NEEDED FOR  SPASMS  (ABDOMINAL  PAIN) 90 tablet 0   Doxylamine Succinate, Sleep, (UNISOM PO) Take 1 tablet by mouth as needed. [employer] brand     fluticasone (FLONASE) 50 MCG/ACT nasal spray Place 1 spray into both nostrils daily. 16 g 2   montelukast (SINGULAIR) 10 MG tablet Take 1 tablet (10 mg total) by mouth at bedtime. 30 tablet 5   sertraline (ZOLOFT) 100 MG tablet Take 1 tablet (100 mg total) by mouth daily. NO REFILLS. NEEDS TO TRANSITION TO A NEW PCP. 30 tablet 0   No current facility-administered medications for this visit.   Allergies: Allergies  Allergen Reactions   Penicillins Shortness Of Breath    Has patient had a PCN reaction causing immediate rash, facial/tongue/throat swelling, SOB or lightheadedness with hypotension: Yes Has patient had a PCN reaction causing severe rash involving mucus membranes or skin necrosis: No Has patient had a PCN reaction that required hospitalization No Has patient had a PCN reaction occurring within the last 10 years: No If all of the above answers are "NO", then may proceed with Cephalosporin use.     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  Vaping Use   Vaping Use: Never used  Substance and Sexual Activity   Alcohol use: No   Drug use: No   Sexual activity: Yes  Other Topics Concern   Not on file  Social History Narrative   Not on file   Social Determinants of Health   Financial Resource Strain: Not on file  Food Insecurity: Not on file  Transportation Needs: Not on file  Physical Activity: Not on file  Stress: Not on file  Social Connections: Not on file   Lives in a ***. Smoking: *** Occupation: ***  Environmental HistorySurveyor, minerals in the house: Copywriter, advertising in the family room: {Blank single:19197::"yes","no"} Carpet in the bedroom: {Blank single:19197::"yes","no"} Heating: {Blank single:19197::"electric","gas","heat pump"} Cooling: {Blank single:19197::"central","window","heat pump"} Pet: {Blank single:19197::"yes ***","no"}  Family History: Family History  Problem Relation Age of Onset   Miscarriages / Stillbirths Sister    Stroke Mother    Stroke Father    Cerebral aneurysm Father    Allergic rhinitis Neg Hx    Angioedema Neg Hx    Asthma Neg Hx    Eczema Neg Hx    Immunodeficiency Neg Hx    Urticaria Neg Hx    Problem                               Relation Asthma                                   *** Eczema                                *** Food allergy                          *** Allergic rhino conjunctivitis     ***  Review of Systems  Constitutional:  Negative for appetite change, chills, fever and unexpected weight change.  HENT:  Negative for congestion and rhinorrhea.   Eyes:  Negative for itching.  Respiratory:  Negative for cough, chest tightness, shortness of breath and wheezing.   Cardiovascular:  Negative for chest pain.  Gastrointestinal:  Negative for abdominal pain.  Genitourinary:   Negative for difficulty urinating.  Skin:  Negative for rash.  Neurological:  Negative for headaches.   Objective: There were no vitals taken for this visit. There is no height or weight on file to calculate BMI. Physical Exam Vitals and nursing note reviewed.  Constitutional:      Appearance: Normal appearance. She is well-developed.  HENT:     Head: Normocephalic and atraumatic.     Right Ear: Tympanic membrane and external ear normal.     Left Ear: Tympanic membrane and external ear normal.     Nose: Nose normal.     Mouth/Throat:     Mouth: Mucous membranes are moist.     Pharynx: Oropharynx is  clear.  Eyes:     Conjunctiva/sclera: Conjunctivae normal.  Cardiovascular:     Rate and Rhythm: Normal rate and regular rhythm.     Heart sounds: Normal heart sounds. No murmur heard.   No friction rub. No gallop.  Pulmonary:     Effort: Pulmonary effort is normal.     Breath sounds: Normal breath sounds. No wheezing, rhonchi or rales.  Musculoskeletal:     Cervical back: Neck supple.  Skin:    General: Skin is warm.     Findings: No rash.  Neurological:     Mental Status: She is alert and oriented to person, place, and time.  Psychiatric:        Behavior: Behavior normal.   The plan was reviewed with the patient/family, and all questions/concerned were addressed.  It was my pleasure to see Lasheena today and participate in her care. Please feel free to contact me with any questions or concerns.  Sincerely,  Wyline Mood, DO Allergy & Immunology  [facility] office: [phone] [facility] office: [phone]

## 2021-11-15 ENCOUNTER — Encounter: Payer: Self-pay | Admitting: Physician Assistant

## 2021-11-15 DIAGNOSIS — Z0189 Encounter for other specified special examinations: Secondary | ICD-10-CM

## 2021-11-15 DIAGNOSIS — Z20828 Contact with and (suspected) exposure to other viral communicable diseases: Secondary | ICD-10-CM

## 2021-11-20 ENCOUNTER — Other Ambulatory Visit: Payer: Self-pay | Admitting: Family Medicine

## 2021-11-21 ENCOUNTER — Other Ambulatory Visit: Payer: Self-pay | Admitting: *Deleted

## 2021-11-21 NOTE — Telephone Encounter (Signed)
Please call pt and inform her that she MUST establish care with a pcp for refills

## 2021-11-22 DIAGNOSIS — Z0189 Encounter for other specified special examinations: Secondary | ICD-10-CM | POA: Diagnosis not present

## 2021-11-22 NOTE — Telephone Encounter (Signed)
Attempted to call once, no answer & no voicemail set up. Will attempt again later.

## 2021-11-22 NOTE — Telephone Encounter (Signed)
Patient stated [place] is [place] and did not want to schedule now, she stated she would call around the first of the year to establish with a new PCP. Patient aware no further refills until she does. AM

## 2021-11-24 LAB — NICOTINE/COTININE METABOLITES
Cotinine: <1 ng/mL
Nicotine: <1 ng/mL

## 2021-11-24 MED ORDER — OSELTAMIVIR PHOSPHATE 75 MG PO CAPS: 75.0000 mg | ORAL_CAPSULE | Freq: Every day | ORAL | 0 refills | Status: DC

## 2021-11-24 NOTE — Addendum Note (Signed)
Addended by: Hyman Hopes B on: 11/24/2021 05:00 PM   Modules accepted: Orders

## 2022-01-01 ENCOUNTER — Other Ambulatory Visit: Payer: Self-pay | Admitting: Family Medicine

## 2022-01-17 ENCOUNTER — Encounter: Payer: Self-pay | Admitting: Physician Assistant

## 2022-01-17 ENCOUNTER — Telehealth (INDEPENDENT_AMBULATORY_CARE_PROVIDER_SITE_OTHER): Payer: Managed Care, Other (non HMO) | Admitting: Physician Assistant

## 2022-01-17 DIAGNOSIS — F411 Generalized anxiety disorder: Secondary | ICD-10-CM

## 2022-01-17 MED ORDER — SERTRALINE HCL 100 MG PO TABS: 100.0000 mg | ORAL_TABLET | Freq: Every day | ORAL | 3 refills | Status: DC

## 2022-01-17 NOTE — Progress Notes (Signed)
..Virtual Visit via Video Note  I connected with Holly Jackson on 01/17/22 at  3:00 PM EST by a video enabled telemedicine application and verified that I am speaking with the correct person using two identifiers.  Location: Patient: home Provider: clinic  .Marland KitchenParticipating in visit:  Patient: Holly Jackson Provider: Tandy Gaw PA-C Provider in training: Shawna Orleans PA-S   I discussed the limitations of evaluation and management by telemedicine and the availability of in person appointments. The patient expressed understanding and agreed to proceed.  History of Present Illness: Pt is a 41 yo female who needs refills zoloft for anxiety. She is doing great on medication. No side effects. Controlled on 100mg  for a while. No SI/HC. Work and home life going well.   .. Active Ambulatory Problems    Diagnosis Date Noted   Allergy to penicillin - causes SOB; mild resp distress 03/01/2017   Allergy to sulfa drugs - mod-severe rash 03/01/2017   Generalized anxiety disorder 09/17/2014   Hx of abdominoplasty 09/17/2014   Previous cesarean delivery, antepartum condition or complication 09/22/2014   History of depression 03/01/2017   AMA (advanced maternal age) multigravida 35+, third trimester 03/01/2017   Velamentous insertion of umbilical cord 03/01/2017   Atelectasis of left lung 03/07/2017   Mild persistent asthma 05/23/2017   Single umbilical artery 06/02/2019   Gestational diabetes 06/02/2019   Infertility, female 06/02/2019   History of cholestasis during pregnancy--2015 pregnancy 06/02/2019   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, empty 2nd sac at 9 weeks 06/02/2019   Encounter for maternal care for low transverse scar from previous cesarean delivery 06/18/2019   Cesarean delivery delivered 06/18/2019   Normal postpartum course 06/21/2019   Alteration in comfort associated with postoperative pain 06/21/2019   Incidental lung nodule, > 73mm and < 80mm 06/21/2019   Lumbar  degenerative disc disease 12/16/2019   History of cold sores 09/28/2020   Resolved Ambulatory Problems    Diagnosis Date Noted   Cholestasis of pregnancy 09/17/2014   GBS (group B Streptococcus carrier), +RV culture, currently pregnant 09/17/2014   Obesity, unspecified 09/17/2014   09/19/2014 breech presentation 03/01/2017   Placenta succenturiate lobe affecting fetus 03/01/2017   Status post repeat low transverse cesarean section 03/04/2017   Dermatitis 05/23/2017   Recurrent infections 05/23/2017   Perennial and seasonal allergic rhinitis 05/23/2017   Past Medical History:  Diagnosis Date   Anxiety    Asthma    Depression    IBS (irritable bowel syndrome)    Newborn product of in vitro fertilization (IVF) pregnancy    Vaginal Pap smear, abnormal        Observations/Objective: No acute distress Normal mood and appearance Normal breathing  .05/25/2017 Depression screen [facility] 2/9 01/17/2022 11/02/2021 09/26/2020 11/11/2019 09/09/2019  Decreased Interest 0 0 0 0 0  Down, Depressed, Hopeless 0 0 0 0 0  PHQ - 2 Score 0 0 0 0 0  Altered sleeping - - - - 3  Tired, decreased energy - - - - 1  Change in appetite - - - - 3  Feeling bad or failure about yourself  - - - - 0  Trouble concentrating - - - - 2  Moving slowly or fidgety/restless - - - - 2  Suicidal thoughts - - - - 0  PHQ-9 Score - - - - 11  Difficult doing work/chores - - - - Extremely dIfficult   .09/11/2019 GAD 7 : Generalized Anxiety Score 01/17/2022 11/11/2019 09/09/2019 02/11/2019  Nervous, Anxious, on  Edge 0 0 3 0  Control/stop worrying 0 0 3 0  Worry too much - different things 0 0 3 0  Trouble relaxing 1 1 3  0  Restless 0 0 3 0  Easily annoyed or irritable 0 0 3 0  Afraid - awful might happen 0 0 3 0  Total GAD 7 Score 1 1 21  0  Anxiety Difficulty Not difficult at all - Extremely difficult Not difficult at all     Assessment and Plan:  Diagnoses and all orders for this visit:  Generalized anxiety disorder -     sertraline  (ZOLOFT) 100 MG tablet; Take 1 tablet (100 mg total) by mouth daily.   Refilled zoloft for 1 year.  UTD labs.    Follow Up Instructions:    I discussed the assessment and treatment plan with the patient. The patient was provided an opportunity to ask questions and all were answered. The patient agreed with the plan and demonstrated an understanding of the instructions.   The patient was advised to call back or seek an in-person evaluation if the symptoms worsen or if the condition fails to improve as anticipated.    Marland Kitchen, PA-C

## 2022-01-17 NOTE — Progress Notes (Signed)
Attempted to call left voicemail for pt to call back for intake at 2:00pm  Called left voicemail-2:44pm

## 2022-03-07 ENCOUNTER — Telehealth: Payer: Managed Care, Other (non HMO) | Admitting: Nurse Practitioner

## 2022-03-07 DIAGNOSIS — J00 Acute nasopharyngitis [common cold]: Secondary | ICD-10-CM | POA: Diagnosis not present

## 2022-03-07 DIAGNOSIS — J029 Acute pharyngitis, unspecified: Secondary | ICD-10-CM

## 2022-03-07 NOTE — Progress Notes (Signed)
? ?Virtual Visit Consent  ? ?Holly Jackson, you are scheduled for a virtual visit with Mary-Margaret Hassell Done, [place], a [facility] provider, today.   ?  ?Just as with appointments in the office, your consent must be obtained to participate.  Your consent will be active for this visit and any virtual visit you may have with one of our providers in the next 365 days.   ?  ?If you have a MyChart account, a copy of this consent can be sent to you electronically.  All virtual visits are billed to your insurance company just like a traditional visit in the office.   ? ?As this is a virtual visit, video technology does not allow for your provider to perform a traditional examination.  This may limit your provider's ability to fully assess your condition.  If your provider identifies any concerns that need to be evaluated in person or the need to arrange testing (such as labs, EKG, etc.), we will make arrangements to do so.   ?  ?Although advances in technology are sophisticated, we cannot ensure that it will always work on either your end or our end.  If the connection with a video visit is poor, the visit may have to be switched to a telephone visit.  With either a video or telephone visit, we are not always able to ensure that we have a secure connection.    ? ?I need to obtain your verbal consent now.   Are you willing to proceed with your visit today? YES ?  ?Holly Jackson has provided verbal consent on 03/07/2022 for a virtual visit (video or telephone). ?  ?Mary-Margaret Hassell Done, FNP  ? ?Date: 03/07/2022 9:41 AM ? ? ?Virtual Visit via Video Note  ? ?I, Mary-Margaret Hassell Done, connected with Holly Jackson ([id number], 20-Apr-1981) on 03/07/22 at  9:45 AM EDT by a video-enabled telemedicine application and verified that I am speaking with the correct person using two identifiers. ? ?Location: ?Patient: Virtual Visit Location Patient: Home ?Provider: Virtual Visit Location Provider: Mobile ?  ?I discussed the limitations of evaluation  and management by telemedicine and the availability of in person appointments. The patient expressed understanding and agreed to proceed.   ? ?History of Present Illness: ?Holly Jackson is a 41 y.o. who identifies as a female who was assigned female at birth, and is being seen today for uri. ? ?HPI: URI  ?This is a new problem. The current episode started yesterday. The problem has been gradually worsening. The maximum temperature recorded prior to her arrival was 100.4 - 100.9 F. The fever has been present for Less than 1 day. Associated symptoms include congestion, coughing, ear pain, rhinorrhea and a sore throat. Pertinent negatives include no headaches. She has tried acetaminophen, NSAIDs and decongestant for the symptoms. The treatment provided mild relief.   ?Review of Systems  ?HENT:  Positive for congestion, ear pain, rhinorrhea and sore throat.   ?Respiratory:  Positive for cough.   ?Neurological:  Negative for headaches.  ? ?Problems:  ?Patient Active Problem List  ? Diagnosis Date Noted  ? History of cold sores 09/28/2020  ? Lumbar degenerative disc disease 12/16/2019  ? Normal postpartum course 06/21/2019  ? Alteration in comfort associated with postoperative pain 06/21/2019  ? Incidental lung nodule, > 68mm and < 21mm 06/21/2019  ? Encounter for maternal care for low transverse scar from previous cesarean delivery 06/18/2019  ? Cesarean delivery delivered 06/18/2019  ? Single umbilical artery [id number]  ? Gestational diabetes 06/02/2019  ?  Infertility, female 06/02/2019  ? History of cholestasis during pregnancy--2015 pregnancy 06/02/2019  ? 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, empty 2nd sac at 9 weeks 06/02/2019  ? Mild persistent asthma 05/23/2017  ? Atelectasis of left lung 03/07/2017  ? Allergy to penicillin - causes SOB; mild resp distress 03/01/2017  ? Allergy to sulfa drugs - mod-severe rash 03/01/2017  ? History of depression 03/01/2017  ? AMA (advanced maternal age)  multigravida 2+, third trimester 03/01/2017  ? Velamentous insertion of umbilical cord [id number]  ? Previous cesarean delivery, antepartum condition or complication 0000000  ? Generalized anxiety disorder 09/17/2014  ? Hx of abdominoplasty 09/17/2014  ?  ?Allergies:  ?Allergies  ?Allergen Reactions  ? Penicillins Shortness Of Breath  ?  Has patient had a PCN reaction causing immediate rash, facial/tongue/throat swelling, SOB or lightheadedness with hypotension: Yes ?Has patient had a PCN reaction causing severe rash involving mucus membranes or skin necrosis: No ?Has patient had a PCN reaction that required hospitalization No ?Has patient had a PCN reaction occurring within the last 10 years: No ?If all of the above answers are "NO", then may proceed with Cephalosporin use. ? ?  ? Sulfa Antibiotics Rash  ? Sulfasalazine Rash  ? ?Medications:  ?Current Outpatient Medications:  ?  albuterol (VENTOLIN HFA) 108 (90 Base) MCG/ACT inhaler, Inhale 2 puffs into the lungs every 6 (six) hours as needed for wheezing., Disp: 18 g, Rfl: 11 ?  dicyclomine (BENTYL) 20 MG tablet, TAKE 1 TABLET BY MOUTH THREE TIMES DAILY AS NEEDED FOR  SPASMS  (ABDOMINAL  PAIN), Disp: 90 tablet, Rfl: 0 ?  Doxylamine Succinate, Sleep, (UNISOM PO), Take 1 tablet by mouth as needed. [employer] brand, Disp: , Rfl:  ?  sertraline (ZOLOFT) 100 MG tablet, Take 1 tablet (100 mg total) by mouth daily., Disp: 90 tablet, Rfl: 3 ? ?Observations/Objective: ?Patient is well-developed, well-nourished in no acute distress.  ?Resting comfortably  at home.  ?Head is normocephalic, atraumatic.  ?No labored breathing.  ?Speech is clear and coherent with logical content.  ?Patient is alert and oriented at baseline.  ?Raspy voice ?No cough during visit ?Describes swollen lymph nodes.  ? ?Assessment and Plan: ? ?Holly Jackson in today with chief complaint of No chief complaint on file. ? ? ?1. Acute nasopharyngitis ? ?2. Pharyngitis, unspecified etiology ?Marland Kitchen ?1. Take meds  as prescribed ?2. Use a cool mist humidifier especially during the winter months and when heat has been humid. ?3. Use saline nose sprays frequently ?4. Saline irrigations of the nose can be very helpful if done frequently. ? * 4X daily for 1 week* ? * Use of a nettie pot can be helpful with this. Follow directions with this* ?5. Drink plenty of fluids ?6. Keep thermostat turn down low ?7.For any cough or congestion- OTC with decongestant ?8. For fever or aces or pains- take tylenol or ibuprofen appropriate for age and weight. ? * for fevers greater than 101 orally you may alternate ibuprofen and tylenol every  3 hours. ?  ?Gargle with salt with ?Throat lozgenes ?Use flonase nasal spray that already have ? ?Needs to test for covid- ? ? ? ? ?Follow Up Instructions: ?I discussed the assessment and treatment plan with the patient. The patient was provided an opportunity to ask questions and all were answered. The patient agreed with the plan and demonstrated an understanding of the instructions.  A copy of instructions were sent to the patient via MyChart. ? ?The patient was advised  to call back or seek an in-person evaluation if the symptoms worsen or if the condition fails to improve as anticipated. ? ?Time:  ?I spent 9 minutes with the patient via telehealth technology discussing the above problems/concerns.   ? ?Mary-Margaret Hassell Done, FNP ? ?

## 2022-03-07 NOTE — Patient Instructions (Signed)
1. Take meds as prescribed ?2. Use a cool mist humidifier especially during the winter months and when heat has been humid. ?3. Use saline nose sprays frequently ?4. Saline irrigations of the nose can be very helpful if done frequently. ? * 4X daily for 1 week* ? * Use of a nettie pot can be helpful with this. Follow directions with this* ?5. Drink plenty of fluids ?6. Keep thermostat turn down low ?7.For any cough or congestion- OTC with decongestant ?8. For fever or aces or pains- take tylenol or ibuprofen appropriate for age and weight. ? * for fevers greater than 101 orally you may alternate ibuprofen and tylenol every  3 hours. ?  ? ?

## 2022-07-30 ENCOUNTER — Encounter: Payer: Self-pay | Admitting: Physician Assistant

## 2022-09-03 ENCOUNTER — Encounter: Payer: Self-pay | Admitting: Family Medicine

## 2022-09-03 ENCOUNTER — Ambulatory Visit (INDEPENDENT_AMBULATORY_CARE_PROVIDER_SITE_OTHER): Payer: Managed Care, Other (non HMO)

## 2022-09-03 ENCOUNTER — Ambulatory Visit (INDEPENDENT_AMBULATORY_CARE_PROVIDER_SITE_OTHER): Payer: Managed Care, Other (non HMO) | Admitting: Family Medicine

## 2022-09-03 VITALS — BP 120/72 | HR 83 | Ht 62.0 in | Wt 185.0 lb

## 2022-09-03 DIAGNOSIS — E669 Obesity, unspecified: Secondary | ICD-10-CM

## 2022-09-03 DIAGNOSIS — Z1231 Encounter for screening mammogram for malignant neoplasm of breast: Secondary | ICD-10-CM | POA: Diagnosis not present

## 2022-09-03 DIAGNOSIS — M25551 Pain in right hip: Secondary | ICD-10-CM

## 2022-09-03 DIAGNOSIS — Z0000 Encounter for general adult medical examination without abnormal findings: Secondary | ICD-10-CM | POA: Insufficient documentation

## 2022-09-03 NOTE — Assessment & Plan Note (Signed)
-   positive FABER, tenderness to palpation of R hip.  - ordered hip xrays to look for arthritis  - will refer to Dr. Karie Schwalbe  - wondering if this is an MSK issue since patient sits most of the day vs a ligament tear

## 2022-09-03 NOTE — Progress Notes (Deleted)
New Patient Office Visit  Subjective    Patient ID: Holly Jackson, female    DOB: 1981-03-24  Age: 41 y.o. MRN: [id number]  CC: No chief complaint on file.   HPI [name] presents to establish care ***  Outpatient Encounter Medications as of 09/03/2022  Medication Sig   albuterol (VENTOLIN HFA) 108 (90 Base) MCG/ACT inhaler Inhale 2 puffs into the lungs every 6 (six) hours as needed for wheezing.   dicyclomine (BENTYL) 20 MG tablet TAKE 1 TABLET BY MOUTH THREE TIMES DAILY AS NEEDED FOR  SPASMS  (ABDOMINAL  PAIN)   Doxylamine Succinate, Sleep, (UNISOM PO) Take 1 tablet by mouth as needed. [employer] brand   sertraline (ZOLOFT) 100 MG tablet Take 1 tablet (100 mg total) by mouth daily.   No facility-administered encounter medications on file as of 09/03/2022.    Past Medical History:  Diagnosis Date   Anxiety    Asthma    Cholestasis of pregnancy    Depression    Gestational diabetes    IBS (irritable bowel syndrome)    Newborn product of in vitro fertilization (IVF) pregnancy    Vaginal Pap smear, abnormal    pos HPV   Velamentous insertion of umbilical cord     Past Surgical History:  Procedure Laterality Date   ABDOMINOPLASTY     CESAREAN SECTION N/A 09/21/2014   Procedure: CESAREAN SECTION;  Surgeon: Konrad Felix, MD;  Location: [facility] ORS;  Service: Obstetrics;  Laterality: N/A;   CESAREAN SECTION N/A 03/04/2017   Procedure: CESAREAN SECTION;  Surgeon: Silverio Lay, MD;  Location: [facility];  Service: Obstetrics;  Laterality: N/A;   CESAREAN SECTION N/A 06/18/2019   Procedure: Repeat CESAREAN SECTION;  Surgeon: Silverio Lay, MD;  Location: [facility] LD ORS;  Service: Obstetrics;  Laterality: N/A;  Nigel Bridgeman Assisting    Family History  Problem Relation Age of Onset   Miscarriages / Stillbirths Sister    Stroke Mother    Stroke Father    Cerebral aneurysm Father    Allergic rhinitis Neg Hx    Angioedema Neg Hx    Asthma Neg Hx    Eczema Neg Hx     Immunodeficiency Neg Hx    Urticaria Neg Hx     Social History   Socioeconomic History   Marital status: Married    Spouse name: Not on file   Number of children: 2   Years of education: Not on file   Highest education level: Not on file  Occupational History   Occupation: RISK ANALYST    Employer: [employer]  Tobacco Use   Smoking status: Former    Years: 3.00    Types: Cigarettes    Quit date: 2007    Years since quitting: 16.7   Smokeless tobacco: Never   Tobacco comments:    Social smoking 2-3 cigarettes per week.  Vaping Use   Vaping Use: Never used  Substance and Sexual Activity   Alcohol use: No   Drug use: No   Sexual activity: Yes  Other Topics Concern   Not on file  Social History Narrative   Not on file   Social Determinants of Health   Financial Resource Strain: Low Risk  (06/09/2019)   Overall Financial Resource Strain (CARDIA)    Difficulty of Paying Living Expenses: Not hard at all  Food Insecurity: No Food Insecurity (06/09/2019)   Hunger Vital Sign    Worried About Running Out of Food in the Last Year:  Never true    Ran Out of Food in the Last Year: Never true  Transportation Needs: Unknown (06/09/2019)   PRAPARE - Administrator, Civil Service (Medical): No    Lack of Transportation (Non-Medical): Not on file  Physical Activity: Not on file  Stress: No Stress Concern Present (06/09/2019)   [employer] of Occupational Health - Occupational Stress Questionnaire    Feeling of Stress : Not at all  Social Connections: Not on file  Intimate Partner Violence: Not At Risk (06/09/2019)   Humiliation, Afraid, Rape, and Kick questionnaire    Fear of Current or Ex-Partner: No    Emotionally Abused: No    Physically Abused: No    Sexually Abused: No    ROS      Objective    There were no vitals taken for this visit.  Physical Exam  {Labs (Optional):23779}    Assessment & Plan:   Problem List Items Addressed This Visit    None   No follow-ups on file.   Charlton Amor, DO

## 2022-09-03 NOTE — Progress Notes (Signed)
Annual Wellness Visit     Patient: Holly Jackson, Female    DOB: 1981-09-22, 41 y.o.   MRN: [id number]  Subjective  Chief Complaint  Patient presents with   Annual Exam    Holly Jackson is a 41 y.o. female who presents today for her Annual Wellness Visit. She reports consuming a general diet.  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  She generally feels well. She reports sleeping fairly well but she takes an otc unisom to sleep.  She does have additional problems to discuss today.   HPI  Vision:last year-normal  and Dental: No current dental problems and Last dental visit: two months ago, everything went well.    Family Hx HTN: mom DM: none Cancers: nasopharyngeal cancer Heart disease: sister has pericarditis Father: brain aneurysm at 40 year  Alcohol: two glasses in a sitting Smoking: not smoking; did  Illicit drug use: none  Mammogram: needs one  Pap smear: had in 2021-normal; due in 2026 Pt did have HPV in 2008 and was treated with gardisil   Other issues for today:  R hip pain She is having R hip pain that started in January and has gotten worse. She was cooking yesterday and was on her feet for too long and started to hurt.   Weight gain She is also wanting to discuss weight gain. She feels tired and has noticed she gained 15lbs recently. She attributes this to her nephew passing from cancer. She lives a sedentary lifestyle and works at home.   Medications: Outpatient Medications Prior to Visit  Medication Sig   albuterol (VENTOLIN HFA) 108 (90 Base) MCG/ACT inhaler Inhale 2 puffs into the lungs every 6 (six) hours as needed for wheezing.   Doxylamine Succinate, Sleep, (UNISOM PO) Take 1 tablet by mouth as needed. [employer] brand   sertraline (ZOLOFT) 100 MG tablet Take 1 tablet (100 mg total) by mouth daily.   dicyclomine (BENTYL) 20 MG tablet TAKE 1 TABLET BY MOUTH THREE TIMES DAILY AS NEEDED FOR  SPASMS  (ABDOMINAL  PAIN)   No facility-administered  medications prior to visit.    Allergies  Allergen Reactions   Penicillins Shortness Of Breath    Has patient had a PCN reaction causing immediate rash, facial/tongue/throat swelling, SOB or lightheadedness with hypotension: Yes Has patient had a PCN reaction causing severe rash involving mucus membranes or skin necrosis: No Has patient had a PCN reaction that required hospitalization No Has patient had a PCN reaction occurring within the last 10 years: No If all of the above answers are "NO", then may proceed with Cephalosporin use.     Sulfa Antibiotics Rash   Sulfasalazine Rash    No care team member to display  Review of Systems  Constitutional:  Positive for malaise/fatigue. Negative for chills and fever.  Respiratory:  Negative for cough and shortness of breath.   Cardiovascular:  Negative for chest pain.  Neurological:  Negative for headaches.      Objective  BP 120/72   Pulse 83   Ht 5\' 2"  (1.575 m)   Wt 185 lb (83.9 kg)   SpO2 99%   BMI 33.84 kg/m   Physical Exam Vitals and nursing note reviewed.  Constitutional:      General: She is not in acute distress.    Appearance: Normal appearance.  HENT:     Head: Normocephalic and atraumatic.     Right Ear: External ear normal.     Left Ear:  External ear normal.     Nose: Nose normal.  Eyes:     Conjunctiva/sclera: Conjunctivae normal.  Cardiovascular:     Rate and Rhythm: Normal rate and regular rhythm.  Pulmonary:     Effort: Pulmonary effort is normal.     Breath sounds: Normal breath sounds.  Abdominal:     General: Abdomen is flat. Bowel sounds are normal.     Palpations: Abdomen is soft.     Tenderness: There is no abdominal tenderness.  Musculoskeletal:     Comments: Positive FABER on right leg. Negative FADIR. Tenderness to palpation of right hip. No tenderness to palpation of R IT band. No abnormal gait noticed.   Neurological:     General: No focal deficit present.     Mental Status: She is  alert and oriented to person, place, and time.  Psychiatric:        Mood and Affect: Mood normal.        Behavior: Behavior normal.        Thought Content: Thought content normal.        Judgment: Judgment normal.     Most recent fall risk assessment:    11/02/2021    3:39 PM  Fall Risk   Falls in the past year? 0  Number falls in past yr: 0  Injury with Fall? 0  Risk for fall due to : No Fall Risks  Follow up Falls evaluation completed    Most recent depression screenings:    01/17/2022    3:03 PM 11/02/2021    3:40 PM  PHQ 2/9 Scores  PHQ - 2 Score 0 0    No results found for any visits on 09/03/22.    Assessment & Plan   Annual wellness visit done today including the all of the following: Reviewed patient's Family Medical History Reviewed and updated list of patient's medical providers Assessment of cognitive impairment was done Assessed patient's functional ability Established a written schedule for health screening services Health Risk Assessent Completed and Reviewed  Exercise Activities and Dietary recommendations  Goals   None     Immunization History  Administered Date(s) Administered   HPV Quadrivalent 07/24/2009   Influenza Split 12/09/2012   Influenza, Seasonal, Injecte, Preservative Fre 11/27/2016   Influenza,inj,Quad PF,6+ Mos 09/09/2019, 09/26/2020, 11/02/2021   Influenza,inj,quad, With Preservative 10/24/2018   Influenza-Unspecified 09/23/2013, 10/24/2017, 09/29/2018   PFIZER(Purple Top)SARS-COV-2 Vaccination 02/24/2020, 04/01/2020, 11/12/2020   Pneumococcal Polysaccharide-23 06/13/2017   Tdap 12/12/2016, 03/24/2019    Health Maintenance  Topic Date Due   Hepatitis C Screening  Never done   HPV VACCINES (2 - 3-dose SCDM series) 08/21/2009   COVID-19 Vaccine (4 - Pfizer series) 01/07/2021   INFLUENZA VACCINE  07/24/2022   PAP SMEAR-Modifier  09/27/2023   TETANUS/TDAP  03/23/2029   HIV Screening  Completed     Discussed health  benefits of physical activity, and encouraged her to engage in regular exercise appropriate for her age and condition.    Problem List Items Addressed This Visit       Other   Obesity (BMI 30.0-34.9)    - recommended diet and exercise for 3 months prior to weight loss medication  - will try to walk twice a week  - recommend using [email or web address] to track calories and see what she is eating.  - at next visit will consider adding rybelsus  - did order a1C and if this comes back elevated will re-evaluate and place pt on ozempic for the  added benefit of weight loss.       Relevant Orders   HgB A1c   Amb ref to Medical Nutrition Therapy-MNT   Routine adult health maintenance - Primary    - ordered CBC, CMP, lipid - due for mammogram, have ordered - up to date on pap smear and due in 2026 - pt has insurance form to fill out that I will complete once lipid results are back      Relevant Orders   CBC   COMPLETE METABOLIC PANEL WITH GFR   TSH + free T4   Lipid panel   Right hip pain    - positive FABER, tenderness to palpation of R hip.  - ordered hip xrays to look for arthritis  - will refer to Dr. Karie Schwalbe  - wondering if this is an MSK issue since patient sits most of the day vs a ligament tear      Relevant Orders   DG HIP UNILAT W OR W/O PELVIS 2-3 VIEWS RIGHT   Other Visit Diagnoses     Encounter for screening mammogram for malignant neoplasm of breast       Relevant Orders   MM DIGITAL SCREENING BILATERAL       Return in about 8 weeks (around 10/29/2022).     Charlton Amor, DO

## 2022-09-03 NOTE — Assessment & Plan Note (Signed)
-   recommended diet and exercise for 3 months prior to weight loss medication  - will try to walk twice a week  - recommend using [email or web address] to track calories and see what she is eating.  - at next visit will consider adding rybelsus  - did order a1C and if this comes back elevated will re-evaluate and place pt on ozempic for the added benefit of weight loss.

## 2022-09-03 NOTE — Assessment & Plan Note (Addendum)
-   ordered CBC, CMP, lipid - due for mammogram, have ordered - up to date on pap smear and due in 2026 - pt has insurance form to fill out that I will complete once lipid results are back

## 2022-09-04 ENCOUNTER — Other Ambulatory Visit: Payer: Self-pay

## 2022-09-04 DIAGNOSIS — D509 Iron deficiency anemia, unspecified: Secondary | ICD-10-CM

## 2022-09-05 LAB — CBC
HCT: 38.8 % (ref 35.0–45.0)
Hemoglobin: 12.9 g/dL (ref 11.7–15.5)
MCH: 26.2 pg — ABNORMAL LOW (ref 27.0–33.0)
MCHC: 33.2 g/dL (ref 32.0–36.0)
MCV: 78.7 fL — ABNORMAL LOW (ref 80.0–100.0)
MPV: 8.8 fL (ref 7.5–12.5)
Platelets: 298 10*3/uL (ref 140–400)
RBC: 4.93 10*6/uL (ref 3.80–5.10)
RDW: 13.5 % (ref 11.0–15.0)
WBC: 8.5 10*3/uL (ref 3.8–10.8)

## 2022-09-05 LAB — COMPLETE METABOLIC PANEL WITH GFR
AG Ratio: 1.4 (calc) (ref 1.0–2.5)
ALT: 14 U/L (ref 6–29)
AST: 16 U/L (ref 10–30)
Albumin: 4.6 g/dL (ref 3.6–5.1)
Alkaline phosphatase (APISO): 70 U/L (ref 31–125)
BUN: 10 mg/dL (ref 7–25)
CO2: 26 mmol/L (ref 20–32)
Calcium: 9.5 mg/dL (ref 8.6–10.2)
Chloride: 100 mmol/L (ref 98–110)
Creat: 0.62 mg/dL (ref 0.50–0.99)
Globulin: 3.4 g/dL (calc) (ref 1.9–3.7)
Glucose, Bld: 118 mg/dL (ref 65–139)
Potassium: 3.8 mmol/L (ref 3.5–5.3)
Sodium: 136 mmol/L (ref 135–146)
Total Bilirubin: 0.4 mg/dL (ref 0.2–1.2)
Total Protein: 8 g/dL (ref 6.1–8.1)
eGFR: 115 mL/min/{1.73_m2} (ref >=60)

## 2022-09-05 LAB — HEMOGLOBIN A1C
Hgb A1c MFr Bld: 5.1 % of total Hgb (ref <5.7)
Mean Plasma Glucose: 100 mg/dL
eAG (mmol/L): 5.5 mmol/L

## 2022-09-05 LAB — TSH+FREE T4: TSH W/REFLEX TO FT4: 1.75 mIU/L

## 2022-09-05 LAB — IRON,TIBC AND FERRITIN PANEL
%SAT: 10 % (calc) — ABNORMAL LOW (ref 16–45)
Ferritin: 179 ng/mL — ABNORMAL HIGH (ref 16–154)
Iron: 38 ug/dL — ABNORMAL LOW (ref 40–190)
TIBC: 378 mcg/dL (calc) (ref 250–450)

## 2022-09-05 LAB — LIPID PANEL
Cholesterol: 181 mg/dL (ref <200)
HDL: 40 mg/dL — ABNORMAL LOW (ref >=50)
LDL Cholesterol (Calc): 102 mg/dL (calc) — ABNORMAL HIGH
Non-HDL Cholesterol (Calc): 141 mg/dL (calc) — ABNORMAL HIGH (ref <130)
Total CHOL/HDL Ratio: 4.5 (calc) (ref <5.0)
Triglycerides: 272 mg/dL — ABNORMAL HIGH (ref <150)

## 2022-09-05 LAB — TEST AUTHORIZATION

## 2022-09-07 ENCOUNTER — Encounter: Payer: Self-pay | Admitting: Family Medicine

## 2022-09-12 ENCOUNTER — Other Ambulatory Visit: Payer: Self-pay | Admitting: Family Medicine

## 2022-09-12 DIAGNOSIS — D638 Anemia in other chronic diseases classified elsewhere: Secondary | ICD-10-CM

## 2022-09-13 ENCOUNTER — Other Ambulatory Visit: Payer: Self-pay | Admitting: Family Medicine

## 2022-09-13 DIAGNOSIS — D638 Anemia in other chronic diseases classified elsewhere: Secondary | ICD-10-CM

## 2022-09-17 ENCOUNTER — Other Ambulatory Visit: Payer: Self-pay | Admitting: Family

## 2022-09-17 DIAGNOSIS — D649 Anemia, unspecified: Secondary | ICD-10-CM

## 2022-09-18 ENCOUNTER — Inpatient Hospital Stay (HOSPITAL_BASED_OUTPATIENT_CLINIC_OR_DEPARTMENT_OTHER): Payer: Managed Care, Other (non HMO) | Admitting: Family

## 2022-09-18 ENCOUNTER — Inpatient Hospital Stay: Payer: Managed Care, Other (non HMO) | Attending: Hematology & Oncology

## 2022-09-18 ENCOUNTER — Encounter: Payer: Self-pay | Admitting: Family

## 2022-09-18 VITALS — BP 136/76 | HR 73 | Temp 98.3°F | Resp 17 | Wt 186.0 lb

## 2022-09-18 DIAGNOSIS — Z87891 Personal history of nicotine dependence: Secondary | ICD-10-CM | POA: Diagnosis not present

## 2022-09-18 DIAGNOSIS — D500 Iron deficiency anemia secondary to blood loss (chronic): Secondary | ICD-10-CM

## 2022-09-18 DIAGNOSIS — D509 Iron deficiency anemia, unspecified: Secondary | ICD-10-CM | POA: Insufficient documentation

## 2022-09-18 DIAGNOSIS — D649 Anemia, unspecified: Secondary | ICD-10-CM

## 2022-09-18 LAB — CBC WITH DIFFERENTIAL (CANCER CENTER ONLY)
Abs Immature Granulocytes: 0.02 10*3/uL (ref 0.00–0.07)
Basophils Absolute: 0 10*3/uL (ref 0.0–0.1)
Basophils Relative: 1 %
Eosinophils Absolute: 0.1 10*3/uL (ref 0.0–0.5)
Eosinophils Relative: 1 %
HCT: 38.2 % (ref 36.0–46.0)
Hemoglobin: 12.6 g/dL (ref 12.0–15.0)
Immature Granulocytes: 0 %
Lymphocytes Relative: 30 %
Lymphs Abs: 2 10*3/uL (ref 0.7–4.0)
MCH: 26.1 pg (ref 26.0–34.0)
MCHC: 33 g/dL (ref 30.0–36.0)
MCV: 79.3 fL — ABNORMAL LOW (ref 80.0–100.0)
Monocytes Absolute: 0.4 10*3/uL (ref 0.1–1.0)
Monocytes Relative: 6 %
Neutro Abs: 4.1 10*3/uL (ref 1.7–7.7)
Neutrophils Relative %: 62 %
Platelet Count: 277 10*3/uL (ref 150–400)
RBC: 4.82 MIL/uL (ref 3.87–5.11)
RDW: 13 % (ref 11.5–15.5)
WBC Count: 6.6 10*3/uL (ref 4.0–10.5)
nRBC: 0 % (ref 0.0–0.2)

## 2022-09-18 LAB — RETICULOCYTES
Immature Retic Fract: 11.7 % (ref 2.3–15.9)
RBC.: 4.82 MIL/uL (ref 3.87–5.11)
Retic Count, Absolute: 101.7 10*3/uL (ref 19.0–186.0)
Retic Ct Pct: 2.1 % (ref 0.4–3.1)

## 2022-09-18 LAB — CMP (CANCER CENTER ONLY)
ALT: 13 U/L (ref 0–44)
AST: 16 U/L (ref 15–41)
Albumin: 4.7 g/dL (ref 3.5–5.0)
Alkaline Phosphatase: 63 U/L (ref 38–126)
Anion gap: 9 (ref 5–15)
BUN: 11 mg/dL (ref 6–20)
CO2: 25 mmol/L (ref 22–32)
Calcium: 9.5 mg/dL (ref 8.9–10.3)
Chloride: 103 mmol/L (ref 98–111)
Creatinine: 0.59 mg/dL (ref 0.44–1.00)
GFR, Estimated: >60 mL/min (ref >60)
Glucose, Bld: 118 mg/dL — ABNORMAL HIGH (ref 70–99)
Potassium: 3.9 mmol/L (ref 3.5–5.1)
Sodium: 137 mmol/L (ref 135–145)
Total Bilirubin: 0.3 mg/dL (ref 0.3–1.2)
Total Protein: 7.8 g/dL (ref 6.5–8.1)

## 2022-09-18 LAB — IRON AND IRON BINDING CAPACITY (CC-WL,HP ONLY)
Iron: 85 ug/dL (ref 28–170)
Saturation Ratios: 21 % (ref 10.4–31.8)
TIBC: 399 ug/dL (ref 250–450)
UIBC: 314 ug/dL (ref 148–442)

## 2022-09-18 LAB — LACTATE DEHYDROGENASE: LDH: 135 U/L (ref 98–192)

## 2022-09-18 LAB — FERRITIN: Ferritin: 155 ng/mL (ref 11–307)

## 2022-09-18 NOTE — Progress Notes (Signed)
Hematology/Oncology Consultation   Name: Holly Jackson      MRN: [id number]    Location: Room/bed info not found  Date: 09/18/2022 Time:9:10 AM   REFERRING PHYSICIAN: Morey Hummingbird, DO  REASON FOR CONSULT: Anemia of chronic disease   DIAGNOSIS: Iron deficiency anemia   HISTORY OF PRESENT ILLNESS: Holly Jackson is a very pleasant 41 yo African American female with history of iron deficiency anemia.  She states that she has taken an iron supplement in the past. No history of IV iron.  She is symptomatic with fatigue, joint aches and pains (especially in the right hip), SOB and palpitations with exertion, brain fog, craving cold drinks and lightheadedness.  No blood loss noted. She has a Mirena IUD in  place and does not have a cycle. She has not noted any blood loss in her stool.  No abnormal bruising, no petechiae.  She states that her father and sister also have history of anemia.  No known history of sickle cell disease or trait.  Her father passed away from a brain aneurysm.  Her sister has history of pericarditis.  Sadly, she recently lost her 66 yo nephew (her sister's son) to nasopharyngeal cancer.  No personal history of cancer.  She has 3 children and no history of miscarriage.  She had her C-sections without any complications.  No history of diabetes or thyroid disease.  No fever, chills, n/v, cough, rash, abdominal pain or changes in bowel or bladder habits.  She has history of IBS with both constipation and diarrhea.  She states that she is hot natured.  No smoking or recreational drug use.  ETOH socially.  Appetite and hydration are good. Weight is stable at 186 lbs.  She and her sweet family moved to the [place] from [place] in the early 2000's.   ROS: All other 10 point review of systems is negative.   PAST MEDICAL HISTORY:   Past Medical History:  Diagnosis Date   Anxiety    Asthma    Cholestasis of pregnancy    Depression    Gestational diabetes    IBS (irritable bowel  syndrome)    Newborn product of in vitro fertilization (IVF) pregnancy    Vaginal Pap smear, abnormal    pos HPV   Velamentous insertion of umbilical cord     ALLERGIES: Allergies  Allergen Reactions   Penicillins Shortness Of Breath    Has patient had a PCN reaction causing immediate rash, facial/tongue/throat swelling, SOB or lightheadedness with hypotension: Yes Has patient had a PCN reaction causing severe rash involving mucus membranes or skin necrosis: No Has patient had a PCN reaction that required hospitalization No Has patient had a PCN reaction occurring within the last 10 years: No If all of the above answers are "NO", then may proceed with Cephalosporin use.     Sulfa Antibiotics Rash   Sulfasalazine Rash      MEDICATIONS:  Current Outpatient Medications on File Prior to Visit  Medication Sig Dispense Refill   albuterol (VENTOLIN HFA) 108 (90 Base) MCG/ACT inhaler Inhale 2 puffs into the lungs every 6 (six) hours as needed for wheezing. 18 g 11   Doxylamine Succinate, Sleep, (UNISOM PO) Take 1 tablet by mouth as needed. [employer] brand     sertraline (ZOLOFT) 100 MG tablet Take 1 tablet (100 mg total) by mouth daily. 90 tablet 3   dicyclomine (BENTYL) 20 MG tablet TAKE 1 TABLET BY MOUTH THREE TIMES DAILY AS NEEDED FOR  SPASMS  (ABDOMINAL  PAIN) (Patient not taking: Reported on 09/18/2022) 90 tablet 0   No current facility-administered medications on file prior to visit.     PAST SURGICAL HISTORY Past Surgical History:  Procedure Laterality Date   ABDOMINOPLASTY     CESAREAN SECTION N/A 09/21/2014   Procedure: CESAREAN SECTION;  Surgeon: Alinda Dooms, MD;  Location: [facility];  Service: Obstetrics;  Laterality: N/A;   CESAREAN SECTION N/A 03/04/2017   Procedure: CESAREAN SECTION;  Surgeon: Delsa Bern, MD;  Location: [facility];  Service: Obstetrics;  Laterality: N/A;   CESAREAN SECTION N/A 06/18/2019   Procedure: Repeat CESAREAN SECTION;  Surgeon: Delsa Bern, MD;  Location: [facility];  Service: Obstetrics;  Laterality: N/A;  Donnel Saxon Assisting    FAMILY HISTORY: Family History  Problem Relation Age of Onset   65 / Stillbirths Sister    Stroke Mother    Stroke Father    Cerebral aneurysm Father    Allergic rhinitis Neg Hx    Angioedema Neg Hx    Asthma Neg Hx    Eczema Neg Hx    Immunodeficiency Neg Hx    Urticaria Neg Hx     SOCIAL HISTORY:  reports that she quit smoking about 16 years ago. Her smoking use included cigarettes. She has never used smokeless tobacco. She reports that she does not drink alcohol and does not use drugs.  PERFORMANCE STATUS: The patient's performance status is 1 - Symptomatic but completely ambulatory  PHYSICAL EXAM: Most Recent Vital Signs: Blood pressure 136/76, pulse 73, temperature 98.3 F (36.8 C), temperature source Oral, resp. rate 17, weight 186 lb (84.4 kg), SpO2 100 %, unknown if currently breastfeeding. BP 136/76 (BP Location: Right Arm, Patient Position: Sitting)   Pulse 73   Temp 98.3 F (36.8 C) (Oral)   Resp 17   Wt 186 lb (84.4 kg)   SpO2 100%   BMI 34.02 kg/m   General Appearance:    Alert, cooperative, no distress, appears stated age  Head:    Normocephalic, without obvious abnormality, atraumatic  Eyes:    PERRL, conjunctiva/corneas clear, EOM's intact, fundi    benign, both eyes        Throat:   Lips, mucosa, and tongue normal; teeth and gums normal  Neck:   Supple, symmetrical, trachea midline, no adenopathy;    thyroid:  no enlargement/tenderness/nodules; no carotid   bruit or JVD  Back:     Symmetric, no curvature, ROM normal, no CVA tenderness  Lungs:     Clear to auscultation bilaterally, respirations unlabored  Chest Wall:    No tenderness or deformity   Heart:    Regular rate and rhythm, S1 and S2 normal, no murmur, rub   or gallop     Abdomen:     Soft, non-tender, bowel sounds active all four quadrants,    no masses, no organomegaly         Extremities:   Extremities normal, atraumatic, no cyanosis or edema  Pulses:   2+ and symmetric all extremities  Skin:   Skin color, texture, turgor normal, no rashes or lesions  Lymph nodes:   Cervical, supraclavicular, and axillary nodes normal  Neurologic:   CNII-XII intact, normal strength, sensation and reflexes    throughout    LABORATORY DATA:  Results for orders placed or performed in visit on 09/18/22 (from the past 48 hour(s))  CBC with Differential ([facility] Only)     Status: Abnormal   Collection Time: 09/18/22  8:46 AM  Result Value Ref Range   WBC Count 6.6 4.0 - 10.5 K/uL   RBC 4.82 3.87 - 5.11 MIL/uL   Hemoglobin 12.6 12.0 - 15.0 g/dL   HCT 38.4 66.5 - 99.3 %   MCV 79.3 (L) 80.0 - 100.0 fL   MCH 26.1 26.0 - 34.0 pg   MCHC 33.0 30.0 - 36.0 g/dL   RDW 57.0 17.7 - 93.9 %   Platelet Count 277 150 - 400 K/uL   nRBC 0.0 0.0 - 0.2 %   Neutrophils Relative % 62 %   Neutro Abs 4.1 1.7 - 7.7 K/uL   Lymphocytes Relative 30 %   Lymphs Abs 2.0 0.7 - 4.0 K/uL   Monocytes Relative 6 %   Monocytes Absolute 0.4 0.1 - 1.0 K/uL   Eosinophils Relative 1 %   Eosinophils Absolute 0.1 0.0 - 0.5 K/uL   Basophils Relative 1 %   Basophils Absolute 0.0 0.0 - 0.1 K/uL   Immature Granulocytes 0 %   Abs Immature Granulocytes 0.02 0.00 - 0.07 K/uL    Comment: Performed at [facility] at [facility], [address] [place], [place] [address]  Reticulocytes     Status: None   Collection Time: 09/18/22  8:47 AM  Result Value Ref Range   Retic Ct Pct 2.1 0.4 - 3.1 %   RBC. 4.82 3.87 - 5.11 MIL/uL   Retic Count, Absolute 101.7 19.0 - 186.0 K/uL   Immature Retic Fract 11.7 2.3 - 15.9 %    Comment: Performed at [facility] at [facility], [address] [place], [place] [address]      RADIOGRAPHY: No results found.     PATHOLOGY: None   ASSESSMENT/PLAN: Ms. Haft is a very pleasant 41 yo African American female with  history of iron deficiency anemia.  Iron studies are stable at this time. She will start taking Fusion plus daily at this time to maintain.  Thalassemia and Hgb fractionation cascade are pending. She may also benefit from folic acid daily.  Follow-up in 2 months.   All questions were answered. The patient knows to call the clinic with any problems, questions or concerns. We can certainly see the patient much sooner if necessary.   Eileen Stanford, NP

## 2022-09-19 DIAGNOSIS — D649 Anemia, unspecified: Secondary | ICD-10-CM

## 2022-09-19 LAB — ERYTHROPOIETIN: Erythropoietin: 7.4 m[IU]/mL (ref 2.6–18.5)

## 2022-09-19 MED ORDER — FUSION PLUS PO CAPS: 1.0000 | ORAL_CAPSULE | Freq: Every day | ORAL | 6 refills | Status: DC

## 2022-09-20 ENCOUNTER — Telehealth: Payer: Self-pay | Admitting: *Deleted

## 2022-09-20 LAB — HGB FRACTIONATION CASCADE
Hgb A2: 2.3 % (ref 1.8–3.2)
Hgb A: 97.7 % (ref 96.4–98.8)
Hgb F: 0 % (ref 0.0–2.0)
Hgb S: 0 %

## 2022-09-20 NOTE — Telephone Encounter (Signed)
Per 09/20/22 los - called and lvm of upcoming appointments - requested callback to confirm

## 2022-09-28 ENCOUNTER — Other Ambulatory Visit: Payer: Self-pay | Admitting: Family Medicine

## 2022-09-28 DIAGNOSIS — F411 Generalized anxiety disorder: Secondary | ICD-10-CM

## 2022-09-28 LAB — ALPHA-THALASSEMIA GENOTYPR

## 2022-10-01 ENCOUNTER — Other Ambulatory Visit: Payer: Self-pay | Admitting: Family

## 2022-10-01 ENCOUNTER — Telehealth: Payer: Self-pay

## 2022-10-01 DIAGNOSIS — E538 Deficiency of other specified B group vitamins: Secondary | ICD-10-CM

## 2022-10-01 DIAGNOSIS — D560 Alpha thalassemia: Secondary | ICD-10-CM

## 2022-10-01 MED ORDER — FOLIC ACID 1 MG PO TABS: 1.0000 mg | ORAL_TABLET | Freq: Every day | ORAL | 11 refills | Status: DC

## 2022-10-01 NOTE — Telephone Encounter (Signed)
-----   Message from Celso Amy, NP sent at 10/01/2022 10:37 AM EDT ----- She was positive for the alpha thalassemia minor trait which she know was a possibility from her previous visit. This is treated with folic acid! I sent in a prescription to her pharmacy :)  Sarah ----- Message ----- From: Buel Ream, Lab [facility] [place] Sent: 09/18/2022   9:08 AM EDT To: Celso Amy, NP

## 2022-10-31 ENCOUNTER — Other Ambulatory Visit: Payer: Self-pay | Admitting: Physician Assistant

## 2022-10-31 DIAGNOSIS — F411 Generalized anxiety disorder: Secondary | ICD-10-CM

## 2022-11-19 ENCOUNTER — Inpatient Hospital Stay: Payer: Managed Care, Other (non HMO) | Attending: Hematology & Oncology

## 2022-11-19 ENCOUNTER — Encounter: Payer: Self-pay | Admitting: Oncology

## 2022-11-19 ENCOUNTER — Inpatient Hospital Stay (HOSPITAL_BASED_OUTPATIENT_CLINIC_OR_DEPARTMENT_OTHER): Payer: Managed Care, Other (non HMO) | Admitting: Oncology

## 2022-11-19 VITALS — BP 134/80 | HR 92 | Temp 98.3°F | Resp 19 | Ht 62.0 in | Wt 188.0 lb

## 2022-11-19 DIAGNOSIS — D563 Thalassemia minor: Secondary | ICD-10-CM | POA: Diagnosis not present

## 2022-11-19 DIAGNOSIS — R5383 Other fatigue: Secondary | ICD-10-CM | POA: Insufficient documentation

## 2022-11-19 DIAGNOSIS — D509 Iron deficiency anemia, unspecified: Secondary | ICD-10-CM | POA: Insufficient documentation

## 2022-11-19 DIAGNOSIS — D500 Iron deficiency anemia secondary to blood loss (chronic): Secondary | ICD-10-CM

## 2022-11-19 DIAGNOSIS — D560 Alpha thalassemia: Secondary | ICD-10-CM | POA: Diagnosis not present

## 2022-11-19 DIAGNOSIS — R21 Rash and other nonspecific skin eruption: Secondary | ICD-10-CM | POA: Insufficient documentation

## 2022-11-19 LAB — CBC WITH DIFFERENTIAL (CANCER CENTER ONLY)
Abs Immature Granulocytes: 0.02 10*3/uL (ref 0.00–0.07)
Basophils Absolute: 0 10*3/uL (ref 0.0–0.1)
Basophils Relative: 0 %
Eosinophils Absolute: 0.1 10*3/uL (ref 0.0–0.5)
Eosinophils Relative: 1 %
HCT: 41.1 % (ref 36.0–46.0)
Hemoglobin: 13.2 g/dL (ref 12.0–15.0)
Immature Granulocytes: 0 %
Lymphocytes Relative: 26 %
Lymphs Abs: 2.1 10*3/uL (ref 0.7–4.0)
MCH: 26.1 pg (ref 26.0–34.0)
MCHC: 32.1 g/dL (ref 30.0–36.0)
MCV: 81.2 fL (ref 80.0–100.0)
Monocytes Absolute: 0.4 10*3/uL (ref 0.1–1.0)
Monocytes Relative: 5 %
Neutro Abs: 5.2 10*3/uL (ref 1.7–7.7)
Neutrophils Relative %: 68 %
Platelet Count: 283 10*3/uL (ref 150–400)
RBC: 5.06 MIL/uL (ref 3.87–5.11)
RDW: 14 % (ref 11.5–15.5)
WBC Count: 7.8 10*3/uL (ref 4.0–10.5)
nRBC: 0 % (ref 0.0–0.2)

## 2022-11-19 LAB — RETICULOCYTES
Immature Retic Fract: 16.2 % — ABNORMAL HIGH (ref 2.3–15.9)
RBC.: 5.01 MIL/uL (ref 3.87–5.11)
Retic Count, Absolute: 139.8 10*3/uL (ref 19.0–186.0)
Retic Ct Pct: 2.8 % (ref 0.4–3.1)

## 2022-11-19 LAB — IRON AND IRON BINDING CAPACITY (CC-WL,HP ONLY)
Iron: 71 ug/dL (ref 28–170)
Saturation Ratios: 17 % (ref 10.4–31.8)
TIBC: 414 ug/dL (ref 250–450)
UIBC: 343 ug/dL (ref 148–442)

## 2022-11-19 LAB — FERRITIN: Ferritin: 165 ng/mL (ref 11–307)

## 2022-11-19 MED ORDER — CLOBETASOL PROPIONATE 0.05 % EX CREA: 1.0000 | TOPICAL_CREAM | Freq: Two times a day (BID) | CUTANEOUS | 1 refills | Status: DC

## 2022-11-19 NOTE — Progress Notes (Signed)
HEMATOLOGY-ONCOLOGY PROGRESS NOTE  ASSESSMENT AND PLAN: Holly Jackson is a very pleasant 41 yo African American female with history of iron deficiency anemia.  CBC is normal today.  Iron studies are pending.  She will start taking Fusion plus daily at this time to maintain.  She is also positive for alpha thalassemia minor trait.  Continue folic acid. For the rash on her shin, I have sent a prescription for clobetasol cream. Follow-up in 3 to 4 months with repeat lab work.  SUBJECTIVE: Reports ongoing fatigue.  She also notes a rash to her right shin.  Rash is pruritic.  Not tried anything for this yet. She is not having any chest pain, shortness of breath, abdominal pain, nausea, vomiting, easy bruisability, bleeding.  REVIEW OF SYSTEMS:   Review of Systems  Constitutional:  Positive for malaise/fatigue. Negative for chills and fever.  HENT: Negative.    Eyes: Negative.   Respiratory: Negative.    Cardiovascular: Negative.   Gastrointestinal: Negative.   Genitourinary: Negative.   Musculoskeletal: Negative.   Skin:  Positive for itching and rash.  Neurological: Negative.   Endo/Heme/Allergies: Negative.   Psychiatric/Behavioral: Negative.     I have reviewed the past medical history, past surgical history, social history and family history with the patient and they are unchanged from previous note.   PHYSICAL EXAMINATION: ECOG PERFORMANCE STATUS: 1 - Symptomatic but completely ambulatory  Vitals:   11/19/22 1039  BP: 134/80  Pulse: 92  Resp: 19  Temp: 98.3 F (36.8 C)  SpO2: 99%   Filed Weights   11/19/22 1039  Weight: 85.3 kg    Intake/Output from previous day: No intake/output data recorded.  Physical Exam Vitals reviewed.  Constitutional:      General: She is not in acute distress. HENT:     Head: Normocephalic.     Mouth/Throat:     Mouth: Mucous membranes are moist.     Pharynx: No oropharyngeal exudate or posterior oropharyngeal erythema.  Eyes:      General:        Right eye: No discharge.        Left eye: No discharge.  Cardiovascular:     Rate and Rhythm: Normal rate and regular rhythm.  Pulmonary:     Effort: Pulmonary effort is normal. No respiratory distress.     Breath sounds: Normal breath sounds.  Abdominal:     General: Bowel sounds are normal. There is no distension.     Palpations: Abdomen is soft.  Skin:    General: Skin is warm and dry.     Comments: Excoriated rash noted to right shin  Neurological:     Mental Status: She is alert and oriented to person, place, and time.   LABORATORY DATA:  I have reviewed the data as listed    Latest Ref Rng & Units 09/18/2022    8:46 AM 09/03/2022   12:00 AM 11/02/2021    4:08 PM  CMP  Glucose 70 - 99 mg/dL 935  701  99   BUN 6 - 20 mg/dL 11  10  14    Creatinine 0.44 - 1.00 mg/dL  7.79  3.90   Sodium 135 - 145 mmol/L 137  136  136   Potassium 3.5 - 5.1 mmol/L 3.9  3.8  3.8   Chloride 98 - 111 mmol/L 103  100  102   CO2 22 - 32 mmol/L 25  26  27    Calcium 8.9 - 10.3 mg/dL 9.5  9.5  9.3   Total Protein 6.5 - 8.1 g/dL 7.8  8.0  7.5   Total Bilirubin 0.3 - 1.2 mg/dL 0.3  0.4  0.3   Alkaline Phos 38 - 126 U/L 63     AST 15 - 41 U/L 16  16  18    ALT 0 - 44 U/L 13  14  15      Lab Results  Component Value Date   WBC 7.8 11/19/2022   HGB 13.2 11/19/2022   HCT 41.1 11/19/2022   MCV 81.2 11/19/2022   PLT 283 11/19/2022   NEUTROABS 5.2 11/19/2022    No results found for: "CEA1", "CEA", "11/21/2022", "CA125", "PSA1"  No results found.   No future appointments.    11/21/2022, DNP, AGPCNP-BC, AOCNP 11/19/22

## 2022-11-23 ENCOUNTER — Encounter: Payer: Self-pay | Admitting: Family

## 2022-12-28 ENCOUNTER — Ambulatory Visit (INDEPENDENT_AMBULATORY_CARE_PROVIDER_SITE_OTHER): Payer: Managed Care, Other (non HMO)

## 2022-12-28 ENCOUNTER — Ambulatory Visit (INDEPENDENT_AMBULATORY_CARE_PROVIDER_SITE_OTHER): Payer: Managed Care, Other (non HMO) | Admitting: Sports Medicine

## 2022-12-28 DIAGNOSIS — S63501D Unspecified sprain of right wrist, subsequent encounter: Secondary | ICD-10-CM

## 2022-12-28 DIAGNOSIS — M1711 Unilateral primary osteoarthritis, right knee: Secondary | ICD-10-CM

## 2022-12-28 DIAGNOSIS — M51369 Other intervertebral disc degeneration, lumbar region without mention of lumbar back pain or lower extremity pain: Secondary | ICD-10-CM

## 2022-12-28 DIAGNOSIS — S63501A Unspecified sprain of right wrist, initial encounter: Secondary | ICD-10-CM

## 2022-12-28 DIAGNOSIS — M5136 Other intervertebral disc degeneration, lumbar region: Secondary | ICD-10-CM | POA: Diagnosis not present

## 2022-12-28 MED ORDER — MELOXICAM 15 MG PO TABS: ORAL_TABLET | ORAL | 3 refills | Status: DC

## 2022-12-28 MED ORDER — PREDNISONE 20 MG PO TABS: 20.0000 mg | ORAL_TABLET | Freq: Every day | ORAL | 0 refills | Status: DC

## 2022-12-28 MED ORDER — TRIAMCINOLONE ACETONIDE 40 MG/ML IJ SUSP
40.0000 mg | Freq: Once | INTRAMUSCULAR | Status: AC
  Administered 2022-12-28: 40 mg via INTRAMUSCULAR

## 2022-12-28 NOTE — Assessment & Plan Note (Signed)
Increasing pain anterior knee worse going up and down stairs, moderate patellar crepitus. Injection today per patient request, she will do meloxicam and some home conditioning. Return to see me in 6 weeks.

## 2022-12-28 NOTE — Assessment & Plan Note (Signed)
Approximately 10 days status post fall onto an outstretched hand trying to use a hover board, initially seen with question scaphoid fracture, subsequent x-rays at hand surgery office did not show this. On exam today she has good motion, good strength and no discrete areas of tenderness, she can discontinue her splint and do some home conditioning, return to see me as needed for this. To go by the book however we will get another x-ray, scaphoid series and of next week.

## 2022-12-28 NOTE — Progress Notes (Signed)
    Procedures performed today:    Procedure: Real-time Ultrasound Guided injection of the right knee Device: Samsung HS60  Verbal informed consent obtained.  Time-out conducted.  Noted no overlying erythema, induration, or other signs of local infection.  Skin prepped in a sterile fashion.  Local anesthesia: Topical Ethyl chloride.  With sterile technique and under real time ultrasound guidance: Mild effusion noted, 1 cc Kenalog 40, 2 cc lidocaine, 2 cc bupivacaine injected easily Completed without difficulty  Advised to call if fevers/chills, erythema, induration, drainage, or persistent bleeding.  Images permanently stored and available for review in PACS.  Impression: Technically successful ultrasound guided injection.  Independent interpretation of notes and tests performed by another provider:   None.  Brief History, Exam, Impression, and Recommendations:    Patellofemoral arthritis of right knee Increasing pain anterior knee worse going up and down stairs, moderate patellar crepitus. Injection today per patient request, she will do meloxicam and some home conditioning. Return to see me in 6 weeks.  Lumbar degenerative disc disease Pleasant 42 year old female, tried to use one of her kids hover boards, fell onto an outstretched hand backwards, she has known underlying L4-S1 DDD, she had an epidural that only provided a month of relief, we will do low-dose prednisone per her request, home physical therapy, return to see me in 6 weeks, updated MRI for epidural planning if not better.  Right wrist sprain Approximately 10 days status post fall onto an outstretched hand trying to use a hover board, initially seen with question scaphoid fracture, subsequent x-rays at hand surgery office did not show this. On exam today she has good motion, good strength and no discrete areas of tenderness, she can discontinue her splint and do some home conditioning, return to see me as needed for  this. To go by the book however we will get another x-ray, scaphoid series and of next week.    ____________________________________________ Gwen Her. Dianah Field, M.D., ABFM., CAQSM., AME. Primary Care and Sports Medicine [facility]  Adjunct Professor of [employer] [facility] [employer]  Risk manager

## 2022-12-28 NOTE — Assessment & Plan Note (Signed)
Pleasant 42 year old female, tried to use one of her kids hover boards, fell onto an outstretched hand backwards, she has known underlying L4-S1 DDD, she had an epidural that only provided a month of relief, we will do low-dose prednisone per her request, home physical therapy, return to see me in 6 weeks, updated MRI for epidural planning if not better.

## 2022-12-28 NOTE — Addendum Note (Signed)
Addended by: Tarri Glenn A on: 12/28/2022 04:23 PM   Modules accepted: Orders

## 2023-02-08 ENCOUNTER — Ambulatory Visit: Payer: Managed Care, Other (non HMO) | Admitting: Sports Medicine

## 2023-03-07 ENCOUNTER — Other Ambulatory Visit: Payer: Managed Care, Other (non HMO)

## 2023-03-07 ENCOUNTER — Ambulatory Visit: Payer: Managed Care, Other (non HMO) | Admitting: Medical Oncology

## 2023-03-13 ENCOUNTER — Inpatient Hospital Stay (HOSPITAL_BASED_OUTPATIENT_CLINIC_OR_DEPARTMENT_OTHER): Payer: 59 | Admitting: Family

## 2023-03-13 ENCOUNTER — Inpatient Hospital Stay: Payer: 59 | Attending: Hematology & Oncology

## 2023-03-13 ENCOUNTER — Other Ambulatory Visit: Payer: Self-pay | Admitting: Family

## 2023-03-13 ENCOUNTER — Other Ambulatory Visit: Payer: Self-pay

## 2023-03-13 ENCOUNTER — Encounter: Payer: Self-pay | Admitting: Family

## 2023-03-13 VITALS — BP 113/57 | HR 87 | Temp 97.9°F | Resp 17 | Ht 62.0 in | Wt 166.1 lb

## 2023-03-13 DIAGNOSIS — D560 Alpha thalassemia: Secondary | ICD-10-CM

## 2023-03-13 DIAGNOSIS — D500 Iron deficiency anemia secondary to blood loss (chronic): Secondary | ICD-10-CM

## 2023-03-13 DIAGNOSIS — D509 Iron deficiency anemia, unspecified: Secondary | ICD-10-CM | POA: Diagnosis not present

## 2023-03-13 DIAGNOSIS — D563 Thalassemia minor: Secondary | ICD-10-CM | POA: Insufficient documentation

## 2023-03-13 LAB — CBC WITH DIFFERENTIAL (CANCER CENTER ONLY)
Abs Immature Granulocytes: 0.11 10*3/uL — ABNORMAL HIGH (ref 0.00–0.07)
Basophils Absolute: 0 10*3/uL (ref 0.0–0.1)
Basophils Relative: 0 %
Eosinophils Absolute: 0.1 10*3/uL (ref 0.0–0.5)
Eosinophils Relative: 2 %
HCT: 37.5 % (ref 36.0–46.0)
Hemoglobin: 12.1 g/dL (ref 12.0–15.0)
Immature Granulocytes: 1 %
Lymphocytes Relative: 20 %
Lymphs Abs: 1.8 10*3/uL (ref 0.7–4.0)
MCH: 26 pg (ref 26.0–34.0)
MCHC: 32.3 g/dL (ref 30.0–36.0)
MCV: 80.5 fL (ref 80.0–100.0)
Monocytes Absolute: 0.5 10*3/uL (ref 0.1–1.0)
Monocytes Relative: 5 %
Neutro Abs: 6.4 10*3/uL (ref 1.7–7.7)
Neutrophils Relative %: 72 %
Platelet Count: 362 10*3/uL (ref 150–400)
RBC: 4.66 MIL/uL (ref 3.87–5.11)
RDW: 13.9 % (ref 11.5–15.5)
WBC Count: 8.9 10*3/uL (ref 4.0–10.5)
nRBC: 0 % (ref 0.0–0.2)

## 2023-03-13 LAB — RETICULOCYTES
Immature Retic Fract: 9.8 % (ref 2.3–15.9)
RBC.: 4.68 MIL/uL (ref 3.87–5.11)
Retic Count, Absolute: 97.8 10*3/uL (ref 19.0–186.0)
Retic Ct Pct: 2.1 % (ref 0.4–3.1)

## 2023-03-13 LAB — FERRITIN: Ferritin: 279 ng/mL (ref 11–307)

## 2023-03-13 NOTE — Progress Notes (Signed)
Hematology and Oncology Follow Up Visit  Holly Jackson:[id number] 1981/10/30 42 y.o. 03/13/2023   Principle Diagnosis:  Iron deficiency anemia  Alpha thalassemia minor trait  Current Therapy:   Oral iron OTC daily   Interim History:  Holly Jackson is here today for follow-up. She is doing well and has no complaints at this time.  She would like to change to a different oral iron as Fusion plus is expensive. She will try taking an over the counter iron supplement and probiotic once daily at bedtime.  No abnormal blood loss. No bruising or petechiae.  No fever, chills, n/v, cough, rash, dizziness, SOB, chest pain, palpitations, abdominal pain or changes in bowel or bladder habits.  No swelling, tenderness, numbness or tingling in her extremities.  No falls or syncope.  Appetite and hydration are good. Weight is stable at 166 lbs.   ECOG Performance Status: 1 - Symptomatic but completely ambulatory  Medications:  Allergies as of 03/13/2023       Reactions   Penicillins Shortness Of Breath   Has patient had a PCN reaction causing immediate rash, facial/tongue/throat swelling, SOB or lightheadedness with hypotension: Yes Has patient had a PCN reaction causing severe rash involving mucus membranes or skin necrosis: No Has patient had a PCN reaction that required hospitalization No Has patient had a PCN reaction occurring within the last 10 years: No If all of the above answers are "NO", then may proceed with Cephalosporin use.   Sulfa Antibiotics Rash   Sulfasalazine Rash        Medication List        Accurate as of March 13, 2023  1:29 PM. If you have any questions, ask your nurse or doctor.          STOP taking these medications    Fusion Plus Caps Stopped by: Lottie Dawson, NP       TAKE these medications    albuterol 108 (90 Base) MCG/ACT inhaler Commonly known as: VENTOLIN HFA Inhale 2 puffs into the lungs every 6 (six) hours as needed for wheezing.    clobetasol cream 0.05 % Commonly known as: TEMOVATE Apply 1 Application topically 2 (two) times daily.   dicyclomine 20 MG tablet Commonly known as: BENTYL TAKE 1 TABLET BY MOUTH THREE TIMES DAILY AS NEEDED FOR  SPASMS  (ABDOMINAL  PAIN)   folic acid 1 MG tablet Commonly known as: FOLVITE Take 1 tablet (1 mg total) by mouth daily.   meloxicam 15 MG tablet Commonly known as: MOBIC Start after you finish the prednisone, one tab PO every 24 hours with a meal for 2 weeks, then once every 24 hours prn pain.   predniSONE 20 MG tablet Commonly known as: DELTASONE Take 1 tablet (20 mg total) by mouth daily with breakfast.   sertraline 100 MG tablet Commonly known as: ZOLOFT Take 1 tablet (100 mg total) by mouth daily.   UNISOM PO Take 1 tablet by mouth as needed. [employer] brand        Allergies:  Allergies  Allergen Reactions   Penicillins Shortness Of Breath    Has patient had a PCN reaction causing immediate rash, facial/tongue/throat swelling, SOB or lightheadedness with hypotension: Yes Has patient had a PCN reaction causing severe rash involving mucus membranes or skin necrosis: No Has patient had a PCN reaction that required hospitalization No Has patient had a PCN reaction occurring within the last 10 years: No If all of the above answers are "NO", then may proceed with  Cephalosporin use.     Sulfa Antibiotics Rash   Sulfasalazine Rash    Past Medical History, Surgical history, Social history, and Family History were reviewed and updated.  Review of Systems: All other 10 point review of systems is negative.   Physical Exam:  height is 5\' 2"  (1.575 m) and weight is 166 lb 1.9 oz (75.4 kg). Her oral temperature is 97.9 F (36.6 C). Her blood pressure is 113/57 (abnormal) and her pulse is 87. Her respiration is 17 and oxygen saturation is 99%.   Wt Readings from Last 3 Encounters:  03/13/23 166 lb 1.9 oz (75.4 kg)  11/19/22 188 lb (85.3 kg)  09/18/22 186 lb (84.4  kg)    Ocular: Sclerae unicteric, pupils equal, round and reactive to light Ear-nose-throat: Oropharynx clear, dentition fair Lymphatic: No cervical or supraclavicular adenopathy Lungs no rales or rhonchi, good excursion bilaterally Heart regular rate and rhythm, no murmur appreciated Abd soft, nontender, positive bowel sounds MSK no focal spinal tenderness, no joint edema Neuro: non-focal, well-oriented, appropriate affect Breasts: Deferred   Lab Results  Component Value Date   WBC 8.9 03/13/2023   HGB 12.1 03/13/2023   HCT 37.5 03/13/2023   MCV 80.5 03/13/2023   PLT 362 03/13/2023   Lab Results  Component Value Date   FERRITIN 165 11/19/2022   IRON 71 11/19/2022   TIBC 414 11/19/2022   UIBC 343 11/19/2022   IRONPCTSAT 17 11/19/2022   Lab Results  Component Value Date   RETICCTPCT 2.1 03/13/2023   RBC 4.68 03/13/2023   RBC 4.66 03/13/2023   No results found for: "KPAFRELGTCHN", "LAMBDASER", "KAPLAMBRATIO" Lab Results  Component Value Date   IGGSERUM 1,260 05/24/2017   IGMSERUM 95 05/24/2017   No results found for: "TOTALPROTELP", "ALBUMINELP", "A1GS", "A2GS", "BETS", "BETA2SER", "GAMS", "MSPIKE", "SPEI"   Chemistry      Component Value Date/Time   NA 137 09/18/2022 0846   NA 139 05/24/2017 1511   K 3.9 09/18/2022 0846   CL 103 09/18/2022 0846   CO2 25 09/18/2022 0846   BUN 11 09/18/2022 0846   BUN 15 05/24/2017 1511   CREATININE 0.59 09/18/2022 0846   CREATININE 0.62 09/03/2022 0000      Component Value Date/Time   CALCIUM 9.5 09/18/2022 0846   ALKPHOS 63 09/18/2022 0846   AST 16 09/18/2022 0846   ALT 13 09/18/2022 0846   BILITOT 0.3 09/18/2022 0846       Impression and Plan: Holly Jackson is a very pleasant 42 yo African American female with history of iron deficiency anemia and alpha thalassemia minor trait.  Iron studies are pending.  She will try taking an over the counter iron and probiotic daily at bedtime.  Follow-up in 6 months.   Lottie Dawson, NP 3/20/20241:29 PM

## 2023-03-14 LAB — IRON AND IRON BINDING CAPACITY (CC-WL,HP ONLY)
Iron: 50 ug/dL (ref 28–170)
Saturation Ratios: 13 % (ref 10.4–31.8)
TIBC: 381 ug/dL (ref 250–450)
UIBC: 331 ug/dL (ref 148–442)

## 2023-04-09 ENCOUNTER — Other Ambulatory Visit: Payer: 59

## 2023-04-09 ENCOUNTER — Ambulatory Visit (INDEPENDENT_AMBULATORY_CARE_PROVIDER_SITE_OTHER): Payer: 59 | Admitting: Family Medicine

## 2023-04-09 ENCOUNTER — Encounter: Payer: Self-pay | Admitting: Family Medicine

## 2023-04-09 VITALS — BP 111/67 | HR 101 | Ht 62.0 in | Wt 158.0 lb

## 2023-04-09 DIAGNOSIS — R634 Abnormal weight loss: Secondary | ICD-10-CM

## 2023-04-09 DIAGNOSIS — H109 Unspecified conjunctivitis: Secondary | ICD-10-CM | POA: Diagnosis not present

## 2023-04-09 DIAGNOSIS — R1084 Generalized abdominal pain: Secondary | ICD-10-CM

## 2023-04-09 MED ORDER — POLYMYXIN B-TRIMETHOPRIM 10000-0.1 UNIT/ML-% OP SOLN: 1.0000 [drp] | Freq: Four times a day (QID) | OPHTHALMIC | 0 refills | Status: AC

## 2023-04-09 NOTE — Assessment & Plan Note (Signed)
-   pt has lost about 30lbs since weight loss surgery. Will order RUQ [place] to assess for cholecystitis

## 2023-04-09 NOTE — Assessment & Plan Note (Addendum)
-   likely norovirus especially if son had it a few weeks ago. 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 Will order RUQ [place] to assess for GB dysfunction - discussed FODMAP diet and increasing fluids - will order CMP and CBC to check for elyte abnormalities and wbc

## 2023-04-09 NOTE — Assessment & Plan Note (Signed)
-   given eye drops - pt does not wear contact lenses so does not need additional bacterial coverage.

## 2023-04-09 NOTE — Progress Notes (Signed)
Established patient visit   Patient: Holly Jackson   DOB: 01-18-1981   42 y.o. Female  MRN: [id number] Visit Date: 04/09/2023  Today's healthcare provider: Charlton Amor, DO   Chief Complaint  Patient presents with   Conjunctivitis    Kids had it 2 weeks ago. Itching and burning started yesterday.    Abdominal Pain    Started Saturday with diarrhea. She took Bentyl for symptoms, but still has not subsided. She also c/o lightheadedness and headache.     SUBJECTIVE    Chief Complaint  Patient presents with   Conjunctivitis    Kids had it 2 weeks ago. Itching and burning started yesterday.    Abdominal Pain    Started Saturday with diarrhea. She took Bentyl for symptoms, but still has not subsided. She also c/o lightheadedness and headache.    HPI HPI     Conjunctivitis    Additional comments: Kids had it 2 weeks ago. Itching and burning started yesterday.         Abdominal Pain    Additional comments: Started Saturday with diarrhea. She took Bentyl for symptoms, but still has not subsided. She also c/o lightheadedness and headache.       Last edited by Janeece Riggers D, CMA on 04/09/2023  2:02 PM.      Pt presents with abdominal pain, sulfur burps, and pink eye. She has had diarrhea for about 4 days. Son had the stomach bug a few weeks ago. She denies fevers, chills.  Her niece had pink eye and she got it yesterday. She does not wear contact lenses.    Review of Systems  Constitutional:  Negative for activity change, fatigue and fever.  Respiratory:  Negative for cough and shortness of breath.   Cardiovascular:  Negative for chest pain.  Gastrointestinal:  Positive for abdominal pain and diarrhea.  Genitourinary:  Negative for difficulty urinating.       Current Meds  Medication Sig   albuterol (VENTOLIN HFA) 108 (90 Base) MCG/ACT inhaler Inhale 2 puffs into the lungs every 6 (six) hours as needed for wheezing.   dicyclomine (BENTYL) 20 MG tablet TAKE 1  TABLET BY MOUTH THREE TIMES DAILY AS NEEDED FOR  SPASMS  (ABDOMINAL  PAIN)   Doxylamine Succinate, Sleep, (UNISOM PO) Take 1 tablet by mouth as needed. [employer] brand   folic acid (FOLVITE) 1 MG tablet Take 1 tablet (1 mg total) by mouth daily.   sertraline (ZOLOFT) 100 MG tablet Take 1 tablet (100 mg total) by mouth daily.   trimethoprim-polymyxin b (POLYTRIM) ophthalmic solution Place 1 drop into the right eye every 6 (six) hours for 7 days.    OBJECTIVE    BP 111/67   Pulse (!) 101   Ht  (1.575 m)   Wt 158 lb (71.7 kg)   SpO2 99%   BMI 28.90 kg/m   Physical Exam Vitals and nursing note reviewed.  Constitutional:      General: She is not in acute distress.    Appearance: Normal appearance.  HENT:     Head: Normocephalic and atraumatic.     Right Ear: External ear normal.     Left Ear: External ear normal.     Nose: Nose normal.  Eyes:     Conjunctiva/sclera: Conjunctivae normal.  Cardiovascular:     Rate and Rhythm: Normal rate and regular rhythm.  Pulmonary:     Effort: Pulmonary effort is normal.     Breath sounds: Normal  breath sounds.  Abdominal:     General: Bowel sounds are increased.     Tenderness: There is generalized abdominal tenderness and tenderness in the right upper quadrant and right lower quadrant.  Neurological:     General: No focal deficit present.     Mental Status: She is alert and oriented to person, place, and time.  Psychiatric:        Mood and Affect: Mood normal.        Behavior: Behavior normal.        Thought Content: Thought content normal.        Judgment: Judgment normal.        ASSESSMENT & PLAN    Problem List Items Addressed This Visit       Other   Bacterial conjunctivitis    - given eye drops - pt does not wear contact lenses so does not need additional bacterial coverage.      Relevant Medications   trimethoprim-polymyxin b (POLYTRIM) ophthalmic solution   Generalized abdominal pain - Primary    - likely  norovirus especially if son had it a few weeks ago. 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 Will order RUQ [place] to assess for GB dysfunction - discussed FODMAP diet and increasing fluids - will order CMP and CBC to check for elyte abnormalities and wbc      Relevant Orders   US Abdomen Limited RUQ (LIVER/GB)   CBC   COMPLETE METABOLIC PANEL WITH GFR   Excessive weight loss    - pt has lost about 30lbs since weight loss surgery. Will order RUQ [place] to assess for cholecystitis       Relevant Orders   US Abdomen Limited RUQ (LIVER/GB)    Return if symptoms worsen or fail to improve.      Meds ordered this encounter  Medications   trimethoprim-polymyxin b (POLYTRIM) ophthalmic solution    Sig: Place 1 drop into the right eye every 6 (six) hours for 7 days.    Dispense:  10 mL    Refill:  0    Orders Placed This Encounter  Procedures   US Abdomen Limited RUQ (LIVER/GB)    Standing Status:   Future    Standing Expiration Date:   04/08/2024    Order Specific Question:   Reason for Exam (SYMPTOM  OR DIAGNOSIS REQUIRED)    Answer:   RUQ pain    Order Specific Question:   Preferred imaging location?    Answer:   [facility]   CBC   COMPLETE METABOLIC PANEL WITH GFR     Charlton Amor, DO  [facility] Primary Care & Sports Medicine at [facility] [phone] (phone) [phone] (fax)  [facility]

## 2023-04-10 ENCOUNTER — Ambulatory Visit (INDEPENDENT_AMBULATORY_CARE_PROVIDER_SITE_OTHER): Payer: 59

## 2023-04-10 DIAGNOSIS — R1011 Right upper quadrant pain: Secondary | ICD-10-CM | POA: Diagnosis not present

## 2023-04-10 DIAGNOSIS — R1084 Generalized abdominal pain: Secondary | ICD-10-CM

## 2023-04-10 DIAGNOSIS — R634 Abnormal weight loss: Secondary | ICD-10-CM | POA: Diagnosis not present

## 2023-04-10 LAB — CBC
HCT: 41.4 % (ref 35.0–45.0)
Hemoglobin: 13.3 g/dL (ref 11.7–15.5)
MCH: 26.1 pg — ABNORMAL LOW (ref 27.0–33.0)
MCHC: 32.1 g/dL (ref 32.0–36.0)
MCV: 81.2 fL (ref 80.0–100.0)
MPV: 9.2 fL (ref 7.5–12.5)
Platelets: 389 10*3/uL (ref 140–400)
RBC: 5.1 10*6/uL (ref 3.80–5.10)
RDW: 13.6 % (ref 11.0–15.0)
WBC: 9.3 10*3/uL (ref 3.8–10.8)

## 2023-04-10 LAB — COMPLETE METABOLIC PANEL WITH GFR
AG Ratio: 1.6 (calc) (ref 1.0–2.5)
ALT: 13 U/L (ref 6–29)
AST: 19 U/L (ref 10–30)
Albumin: 4.9 g/dL (ref 3.6–5.1)
Alkaline phosphatase (APISO): 91 U/L (ref 31–125)
BUN: 7 mg/dL (ref 7–25)
CO2: 25 mmol/L (ref 20–32)
Calcium: 9.8 mg/dL (ref 8.6–10.2)
Chloride: 105 mmol/L (ref 98–110)
Creat: 0.65 mg/dL (ref 0.50–0.99)
Globulin: 3 g/dL (calc) (ref 1.9–3.7)
Glucose, Bld: 105 mg/dL — ABNORMAL HIGH (ref 65–99)
Potassium: 3.9 mmol/L (ref 3.5–5.3)
Sodium: 140 mmol/L (ref 135–146)
Total Bilirubin: 0.4 mg/dL (ref 0.2–1.2)
Total Protein: 7.9 g/dL (ref 6.1–8.1)
eGFR: 113 mL/min/{1.73_m2} (ref >=60)

## 2023-04-30 ENCOUNTER — Encounter: Payer: Self-pay | Admitting: Family

## 2023-05-03 ENCOUNTER — Encounter: Payer: Self-pay | Admitting: Oncology

## 2023-05-13 ENCOUNTER — Telehealth: Payer: Self-pay | Admitting: *Deleted

## 2023-05-13 NOTE — Telephone Encounter (Signed)
Per staff message Ronne Binning - Called patient and lvm for a callback to schedule a lab only appointment.

## 2023-05-23 ENCOUNTER — Other Ambulatory Visit: Payer: Self-pay

## 2023-05-23 ENCOUNTER — Encounter: Payer: Self-pay | Admitting: Family Medicine

## 2023-05-23 ENCOUNTER — Telehealth: Payer: Self-pay

## 2023-05-23 DIAGNOSIS — F411 Generalized anxiety disorder: Secondary | ICD-10-CM

## 2023-05-23 MED ORDER — SERTRALINE HCL 100 MG PO TABS: 100.0000 mg | ORAL_TABLET | Freq: Every day | ORAL | 0 refills | Status: DC

## 2023-05-23 NOTE — Telephone Encounter (Signed)
Patient called - had been out of zoloft for 3 days and was starting to have withdrawal symptoms.  Scheduled patient for virtual visit tomorrow and sent a 30 day supply of zoloft until patient can be seen.

## 2023-05-24 ENCOUNTER — Telehealth (INDEPENDENT_AMBULATORY_CARE_PROVIDER_SITE_OTHER): Payer: 59 | Admitting: Family Medicine

## 2023-05-24 ENCOUNTER — Encounter: Payer: Self-pay | Admitting: Family Medicine

## 2023-05-24 DIAGNOSIS — F411 Generalized anxiety disorder: Secondary | ICD-10-CM

## 2023-05-24 DIAGNOSIS — F321 Major depressive disorder, single episode, moderate: Secondary | ICD-10-CM | POA: Insufficient documentation

## 2023-05-24 MED ORDER — SERTRALINE HCL 100 MG PO TABS: 100.0000 mg | ORAL_TABLET | Freq: Every day | ORAL | 1 refills | Status: DC

## 2023-05-24 NOTE — Progress Notes (Signed)
Established patient visit   Patient: Holly Jackson   DOB: [birth year]   42 y.o. Female  MRN: [id number] Visit Date: 05/24/2023  Today's healthcare provider: Charlton Amor, DO   Chief Complaint  Patient presents with   mood    SUBJECTIVE    Chief Complaint  Patient presents with   mood    I connected with  Holly Jackson on 05/24/23 by a video and audio enabled telemedicine application and verified that I am speaking with the correct person using two identifiers.  Patient Location: Home  Provider Location: Office/Clinic  I discussed the limitations of evaluation and management by telemedicine. The patient expressed understanding and agreed to proceed.  HPI  Pt presents for follow up on zoloft 100mg . She is doing well on this dose. Denies suicidal or homicidal ideation.  Review of Systems  Constitutional:  Negative for activity change, fatigue and fever.  Respiratory:  Negative for cough and shortness of breath.   Cardiovascular:  Negative for chest pain.  Gastrointestinal:  Negative for abdominal pain.  Genitourinary:  Negative for difficulty urinating.       Current Meds  Medication Sig   albuterol (VENTOLIN HFA) 108 (90 Base) MCG/ACT inhaler Inhale 2 puffs into the lungs every 6 (six) hours as needed for wheezing.   Doxylamine Succinate, Sleep, (UNISOM PO) Take 1 tablet by mouth as needed. [employer] brand   folic acid (FOLVITE) 1 MG tablet Take 1 tablet (1 mg total) by mouth daily.   Iron-FA-B Cmp-C-Biot-Probiotic (FUSION PLUS) CAPS Take 1 capsule by mouth every morning.   levonorgestrel (MIRENA, 52 MG,) 20 MCG/DAY IUD 1 each by Intrauterine route once.   [DISCONTINUED] sertraline (ZOLOFT) 100 MG tablet Take 1 tablet (100 mg total) by mouth daily.    OBJECTIVE      Physical Exam Vitals and nursing note reviewed.  Constitutional:      General: She is not in acute distress.    Appearance: Normal appearance.  HENT:     Head: Normocephalic and atraumatic.      Right Ear: External ear normal.     Left Ear: External ear normal.     Nose: Nose normal.  Eyes:     Conjunctiva/sclera: Conjunctivae normal.  Cardiovascular:     Rate and Rhythm: Normal rate.  Pulmonary:     Effort: Pulmonary effort is normal.  Neurological:     General: No focal deficit present.     Mental Status: She is alert and oriented to person, place, and time.  Psychiatric:        Mood and Affect: Mood normal.        Behavior: Behavior normal.        Thought Content: Thought content normal.        Judgment: Judgment normal.          ASSESSMENT & PLAN    Problem List Items Addressed This Visit       Other   GAD (generalized anxiety disorder) - Primary    - will refill zoloft 100mg  - doing well       Relevant Medications   sertraline (ZOLOFT) 100 MG tablet   Current moderate episode of major depressive disorder without prior episode (HCC)   Relevant Medications   sertraline (ZOLOFT) 100 MG tablet    Return in about 6 months (around 11/23/2023).      Meds ordered this encounter  Medications   sertraline (ZOLOFT) 100 MG tablet    Sig: Take 1 tablet (  100 mg total) by mouth daily.    Dispense:  90 tablet    Refill:  1    No orders of the defined types were placed in this encounter.    Charlton Amor, DO  [facility] Primary Care & Sports Medicine at [facility] [phone] (phone) [phone] (fax)  [facility]

## 2023-05-24 NOTE — Progress Notes (Signed)
Pt reports that the medication is working well for her.

## 2023-05-24 NOTE — Assessment & Plan Note (Signed)
-   will refill zoloft 100mg  - doing well

## 2023-05-24 NOTE — Assessment & Plan Note (Deleted)
-   continue zoloft 100mg   - will give refills

## 2023-05-27 ENCOUNTER — Other Ambulatory Visit: Payer: Self-pay | Admitting: Family Medicine

## 2023-05-27 DIAGNOSIS — E669 Obesity, unspecified: Secondary | ICD-10-CM

## 2023-09-13 ENCOUNTER — Encounter: Payer: Self-pay | Admitting: Family

## 2023-09-13 ENCOUNTER — Inpatient Hospital Stay: Payer: No Typology Code available for payment source | Attending: Hematology & Oncology

## 2023-09-13 ENCOUNTER — Inpatient Hospital Stay (HOSPITAL_BASED_OUTPATIENT_CLINIC_OR_DEPARTMENT_OTHER): Payer: No Typology Code available for payment source | Admitting: Family

## 2023-09-13 ENCOUNTER — Other Ambulatory Visit: Payer: Self-pay

## 2023-09-13 VITALS — BP 112/76 | HR 75 | Temp 98.5°F | Resp 18 | Ht 62.0 in | Wt 163.0 lb

## 2023-09-13 DIAGNOSIS — D560 Alpha thalassemia: Secondary | ICD-10-CM | POA: Diagnosis not present

## 2023-09-13 DIAGNOSIS — D509 Iron deficiency anemia, unspecified: Secondary | ICD-10-CM | POA: Insufficient documentation

## 2023-09-13 DIAGNOSIS — D500 Iron deficiency anemia secondary to blood loss (chronic): Secondary | ICD-10-CM

## 2023-09-13 DIAGNOSIS — D563 Thalassemia minor: Secondary | ICD-10-CM | POA: Insufficient documentation

## 2023-09-13 LAB — CBC WITH DIFFERENTIAL (CANCER CENTER ONLY)
Abs Immature Granulocytes: 0.1 10*3/uL — ABNORMAL HIGH (ref 0.00–0.07)
Basophils Absolute: 0 10*3/uL (ref 0.0–0.1)
Basophils Relative: 0 %
Eosinophils Absolute: 0.1 10*3/uL (ref 0.0–0.5)
Eosinophils Relative: 1 %
HCT: 39.4 % (ref 36.0–46.0)
Hemoglobin: 12.7 g/dL (ref 12.0–15.0)
Immature Granulocytes: 1 %
Lymphocytes Relative: 25 %
Lymphs Abs: 2 10*3/uL (ref 0.7–4.0)
MCH: 26.4 pg (ref 26.0–34.0)
MCHC: 32.2 g/dL (ref 30.0–36.0)
MCV: 81.9 fL (ref 80.0–100.0)
Monocytes Absolute: 0.5 10*3/uL (ref 0.1–1.0)
Monocytes Relative: 6 %
Neutro Abs: 5.3 10*3/uL (ref 1.7–7.7)
Neutrophils Relative %: 67 %
Platelet Count: 291 10*3/uL (ref 150–400)
RBC: 4.81 MIL/uL (ref 3.87–5.11)
RDW: 13.1 % (ref 11.5–15.5)
WBC Count: 7.9 10*3/uL (ref 4.0–10.5)
nRBC: 0 % (ref 0.0–0.2)

## 2023-09-13 LAB — RETICULOCYTES
Immature Retic Fract: 7 % (ref 2.3–15.9)
RBC.: 4.72 MIL/uL (ref 3.87–5.11)
Retic Count, Absolute: 98.6 10*3/uL (ref 19.0–186.0)
Retic Ct Pct: 2.1 % (ref 0.4–3.1)

## 2023-09-13 NOTE — Progress Notes (Signed)
Hematology and Oncology Follow Up Visit  Holly Jackson [id number] November 28, 1981 42 y.o. 09/13/2023   Principle Diagnosis:  Iron deficiency anemia  Alpha thalassemia minor trait   Current Therapy:        Oral iron OTC daily   Interim History:  Ms. Holly Jackson is here today for follow-up. She is doing well but has noted some fatigue.  No blood loss, bruising or petechiae.  She has had a recent episode where her watch read HR of 35 but she was asymptomatic. If this happens again she will call EMS if symptomatic.  She works out and [employer]. She has had a few episodes of left chest pain that lasts a few seconds.  No fever, chills, n/v, cough, rash, dizziness, SOB, chest pain, palpitations, abdominal pain or changes in bowel or bladder habits at this time.  No swelling, tenderness, numbness or tingling in her extremities.  No falls or syncope.  Appetite and hydration are good. Weight is stable at 163 lbs.   ECOG Performance Status: 1 - Symptomatic but completely ambulatory  Medications:  Allergies as of 09/13/2023       Reactions   Penicillins Shortness Of Breath   Has patient had a PCN reaction causing immediate rash, facial/tongue/throat swelling, SOB or lightheadedness with hypotension: Yes Has patient had a PCN reaction causing severe rash involving mucus membranes or skin necrosis: No Has patient had a PCN reaction that required hospitalization No Has patient had a PCN reaction occurring within the last 10 years: No If all of the above answers are "NO", then may proceed with Cephalosporin use.   Sulfa Antibiotics Rash   Sulfasalazine Rash        Medication List        Accurate as of September 13, 2023  2:11 PM. If you have any questions, ask your nurse or doctor.          albuterol 108 (90 Base) MCG/ACT inhaler Commonly known as: VENTOLIN HFA Inhale 2 puffs into the lungs every 6 (six) hours as needed for wheezing.   folic acid 1 MG tablet Commonly known as:  FOLVITE Take 1 tablet (1 mg total) by mouth daily.   Fusion Plus Caps Take 1 capsule by mouth every morning.   Mirena (52 MG) 20 MCG/DAY Iud Generic drug: levonorgestrel 1 each by Intrauterine route once.   sertraline 100 MG tablet Commonly known as: ZOLOFT Take 1 tablet (100 mg total) by mouth daily.   UNISOM PO Take 1 tablet by mouth as needed. [employer] brand        Allergies:  Allergies  Allergen Reactions   Penicillins Shortness Of Breath    Has patient had a PCN reaction causing immediate rash, facial/tongue/throat swelling, SOB or lightheadedness with hypotension: Yes Has patient had a PCN reaction causing severe rash involving mucus membranes or skin necrosis: No Has patient had a PCN reaction that required hospitalization No Has patient had a PCN reaction occurring within the last 10 years: No If all of the above answers are "NO", then may proceed with Cephalosporin use.     Sulfa Antibiotics Rash   Sulfasalazine Rash    Past Medical History, Surgical history, Social history, and Family History were reviewed and updated.  Review of Systems: All other 10 point review of systems is negative.   Physical Exam:  height is 5\' 2"  (1.575 m) and weight is 163 lb (73.9 kg). Her oral temperature is 98.5 F (36.9 C). Her blood pressure is 112/76 and her  pulse is 75. Her respiration is 18 and oxygen saturation is 100%.   Wt Readings from Last 3 Encounters:  09/13/23 163 lb (73.9 kg)  04/09/23 158 lb (71.7 kg)  03/13/23 166 lb 1.9 oz (75.4 kg)    Ocular: Sclerae unicteric, pupils equal, round and reactive to light Ear-nose-throat: Oropharynx clear, dentition fair Lymphatic: No cervical or supraclavicular adenopathy Lungs no rales or rhonchi, good excursion bilaterally Heart regular rate and rhythm, no murmur appreciated Abd soft, nontender, positive bowel sounds MSK no focal spinal tenderness, no joint edema Neuro: non-focal, well-oriented, appropriate  affect Breasts: Deferred  Lab Results  Component Value Date   WBC 7.9 09/13/2023   HGB 12.7 09/13/2023   HCT 39.4 09/13/2023   MCV 81.9 09/13/2023   PLT 291 09/13/2023   Lab Results  Component Value Date   FERRITIN 279 03/13/2023   IRON 50 03/13/2023   TIBC 381 03/13/2023   UIBC 331 03/13/2023   IRONPCTSAT 13 03/13/2023   Lab Results  Component Value Date   RETICCTPCT 2.1 09/13/2023   RBC 4.72 09/13/2023   No results found for: "KPAFRELGTCHN", "LAMBDASER", "KAPLAMBRATIO" Lab Results  Component Value Date   IGGSERUM 1,260 05/24/2017   IGMSERUM 95 05/24/2017   No results found for: "TOTALPROTELP", "ALBUMINELP", "A1GS", "A2GS", "BETS", "BETA2SER", "GAMS", "MSPIKE", "SPEI"   Chemistry      Component Value Date/Time   NA 140 04/09/2023 1429   NA 139 05/24/2017 1511   K 3.9 04/09/2023 1429   CL 105 04/09/2023 1429   CO2 25 04/09/2023 1429   BUN 7 04/09/2023 1429   BUN 15 05/24/2017 1511   CREATININE 0.65 04/09/2023 1429      Component Value Date/Time   CALCIUM 9.8 04/09/2023 1429   ALKPHOS 63 09/18/2022 0846   AST 19 04/09/2023 1429   AST 16 09/18/2022 0846   ALT 13 04/09/2023 1429   ALT 13 09/18/2022 0846   BILITOT 0.4 04/09/2023 1429   BILITOT 0.3 09/18/2022 0846       Impression and Plan: Ms. Holly Jackson is a very pleasant 42 yo African American female with history of iron deficiency anemia and alpha thalassemia minor trait.  Iron studies are pending.  Follow-up in 6 months.   Eileen Stanford, NP 9/20/20242:11 PM

## 2023-09-14 LAB — FERRITIN: Ferritin: 275 ng/mL (ref 11–307)

## 2023-09-16 LAB — IRON AND IRON BINDING CAPACITY (CC-WL,HP ONLY)
Iron: 99 ug/dL (ref 28–170)
Saturation Ratios: 24 % (ref 10.4–31.8)
TIBC: 419 ug/dL (ref 250–450)
UIBC: 320 ug/dL (ref 148–442)

## 2023-10-15 NOTE — Progress Notes (Deleted)
     Established patient visit   Patient: Holly Jackson   DOB: 01-14-1981   42 y.o. Female  MRN: [id number] Visit Date: 10/16/2023  Today's healthcare provider: Charlton Amor, DO   No chief complaint on file.   SUBJECTIVE   No chief complaint on file.  HPI    Review of Systems     No outpatient medications have been marked as taking for the 10/16/23 encounter (Appointment) with Charlton Amor, DO.    OBJECTIVE    There were no vitals taken for this visit.  Physical Exam     ASSESSMENT & PLAN    Problem List Items Addressed This Visit   None   No follow-ups on file.      No orders of the defined types were placed in this encounter.   No orders of the defined types were placed in this encounter.    Charlton Amor, DO  [facility] Primary Care & Sports Medicine at [facility] [phone] (phone) [phone] (fax)  [facility]

## 2023-10-16 ENCOUNTER — Encounter: Payer: 59 | Admitting: Family Medicine

## 2023-11-07 ENCOUNTER — Emergency Department (HOSPITAL_BASED_OUTPATIENT_CLINIC_OR_DEPARTMENT_OTHER)
Admission: EM | Admit: 2023-11-07 | Discharge: 2023-11-07 | Disposition: A | Discharge status: Home / Self Care | Payer: No Typology Code available for payment source | Attending: Emergency Medicine | Admitting: Emergency Medicine

## 2023-11-07 ENCOUNTER — Other Ambulatory Visit: Payer: Self-pay

## 2023-11-07 ENCOUNTER — Emergency Department (HOSPITAL_BASED_OUTPATIENT_CLINIC_OR_DEPARTMENT_OTHER): Payer: No Typology Code available for payment source

## 2023-11-07 ENCOUNTER — Encounter (HOSPITAL_BASED_OUTPATIENT_CLINIC_OR_DEPARTMENT_OTHER): Payer: Self-pay | Admitting: Urology

## 2023-11-07 DIAGNOSIS — Z9884 Bariatric surgery status: Secondary | ICD-10-CM | POA: Diagnosis not present

## 2023-11-07 DIAGNOSIS — R091 Pleurisy: Secondary | ICD-10-CM | POA: Diagnosis not present

## 2023-11-07 DIAGNOSIS — K219 Gastro-esophageal reflux disease without esophagitis: Secondary | ICD-10-CM

## 2023-11-07 DIAGNOSIS — E876 Hypokalemia: Secondary | ICD-10-CM | POA: Diagnosis not present

## 2023-11-07 DIAGNOSIS — Z9889 Other specified postprocedural states: Secondary | ICD-10-CM

## 2023-11-07 DIAGNOSIS — R0789 Other chest pain: Secondary | ICD-10-CM

## 2023-11-07 DIAGNOSIS — R079 Chest pain, unspecified: Secondary | ICD-10-CM | POA: Diagnosis present

## 2023-11-07 LAB — BASIC METABOLIC PANEL
Anion gap: 9 (ref 5–15)
BUN: 8 mg/dL (ref 6–20)
CO2: 26 mmol/L (ref 22–32)
Calcium: 9.4 mg/dL (ref 8.9–10.3)
Chloride: 103 mmol/L (ref 98–111)
Creatinine, Ser: 0.55 mg/dL (ref 0.44–1.00)
GFR, Estimated: >60 mL/min (ref >60)
Glucose, Bld: 98 mg/dL (ref 70–99)
Potassium: 3.4 mmol/L — ABNORMAL LOW (ref 3.5–5.1)
Sodium: 138 mmol/L (ref 135–145)

## 2023-11-07 LAB — CBC
HCT: 40.4 % (ref 36.0–46.0)
Hemoglobin: 13.4 g/dL (ref 12.0–15.0)
MCH: 26.9 pg (ref 26.0–34.0)
MCHC: 33.2 g/dL (ref 30.0–36.0)
MCV: 81 fL (ref 80.0–100.0)
Platelets: 294 10*3/uL (ref 150–400)
RBC: 4.99 MIL/uL (ref 3.87–5.11)
RDW: 14 % (ref 11.5–15.5)
WBC: 6.6 10*3/uL (ref 4.0–10.5)
nRBC: 0 % (ref 0.0–0.2)

## 2023-11-07 LAB — PREGNANCY, URINE: Preg Test, Ur: NEGATIVE

## 2023-11-07 LAB — TROPONIN I (HIGH SENSITIVITY)
Troponin I (High Sensitivity): <2 ng/L (ref <18)
Troponin I (High Sensitivity): <2 ng/L (ref <18)

## 2023-11-07 LAB — D-DIMER, QUANTITATIVE: D-Dimer, Quant: <0.27 ug{FEU}/mL (ref 0.00–0.50)

## 2023-11-07 MED ORDER — IPRATROPIUM-ALBUTEROL 0.5-2.5 (3) MG/3ML IN SOLN
3.0000 mL | Freq: Once | RESPIRATORY_TRACT | Status: AC
Start: 2023-11-07 — End: 2023-11-07
  Administered 2023-11-07: 3 mL via RESPIRATORY_TRACT
  Filled 2023-11-07: qty 3

## 2023-11-07 MED ORDER — KETOROLAC TROMETHAMINE 30 MG/ML IJ SOLN
30.0000 mg | Freq: Once | INTRAMUSCULAR | Status: AC
  Administered 2023-11-07: 30 mg via INTRAVENOUS
  Filled 2023-11-07: qty 1

## 2023-11-07 MED ORDER — POTASSIUM CHLORIDE CRYS ER 20 MEQ PO TBCR
40.0000 meq | EXTENDED_RELEASE_TABLET | Freq: Once | ORAL | Status: AC
Start: 2023-11-07 — End: 2023-11-07
  Administered 2023-11-07: 40 meq via ORAL
  Filled 2023-11-07: qty 2

## 2023-11-07 MED ORDER — ALUM & MAG HYDROXIDE-SIMETH 200-200-20 MG/5ML PO SUSP
30.0000 mL | Freq: Once | ORAL | Status: AC
  Administered 2023-11-07: 30 mL via ORAL
  Filled 2023-11-07: qty 30

## 2023-11-07 MED ORDER — PANTOPRAZOLE SODIUM 40 MG PO TBEC: 40.0000 mg | DELAYED_RELEASE_TABLET | Freq: Every day | ORAL | 0 refills | Status: DC

## 2023-11-07 NOTE — ED Triage Notes (Signed)
Pt states central chest pain that started this am at 0730, states SOB associated with pain, denies cardiac history  States pain worse with deep breath   No history of blood clots

## 2023-11-07 NOTE — ED Notes (Signed)
Patient transported to X-ray 

## 2023-11-07 NOTE — Discharge Instructions (Addendum)
Your chest pain could be what is called pleuritic pain due to a virus or actually gastric reflux causing some esophageal irritation that refers pain to your chest.  I will send you home with a medication called protonix that can help with GERD. If you feel like you have worsening shortness of breath, chest pain, light headedness or start vomiting please return to be seen.

## 2023-11-07 NOTE — ED Provider Notes (Signed)
[place] [facility] AT [facility] Provider Note   CSN: [id number] Arrival date & time: 11/07/23  5621     History  Chief Complaint  Patient presents with   Chest Pain    Holly Jackson is a 42 y.o. female.  Pain starting at 7 am this morning. Patient noticed left sided chest pain with deep breaths extending to her left throat and down her left arm. Denies other shortness of breath. Denies chest pressure. Not tender to touch. Never had this before. No fever, or recent URI symptoms.  Says that this does not feel like her GERD pain. She did recently have a sleeve gastroplasty and was followed up and told that the sutures were coming loose after being evaluated with EGD less than a week ago. Patient was having worsening GERD at that time.  Patient says her GERD is usually retrosternal burning and this is more pleuritic.  No recent long travels or trips with immobilization. She has mirena for contraception does not take OCPs.    Chest Pain Associated symptoms: no cough, no diaphoresis, no dizziness, no fever and no shortness of breath        Home Medications Prior to Admission medications   Medication Sig Start Date End Date Taking? Authorizing Provider  pantoprazole (PROTONIX) 40 MG tablet Take 1 tablet (40 mg total) by mouth daily. 11/07/23  Yes Lockie Mola, MD  albuterol (VENTOLIN HFA) 108 (90 Base) MCG/ACT inhaler Inhale 2 puffs into the lungs every 6 (six) hours as needed for wheezing. 12/04/19   Monica Becton, MD  Doxylamine Succinate, Sleep, (UNISOM PO) Take 1 tablet by mouth as needed. [employer] brand    [provider]  folic acid (FOLVITE) 1 MG tablet Take 1 tablet (1 mg total) by mouth daily. 10/01/22   Erenest Blank, NP  Iron-FA-B Cmp-C-Biot-Probiotic (FUSION PLUS) CAPS Take 1 capsule by mouth every morning. 05/15/23   [provider]  levonorgestrel (MIRENA, 52 MG,) 20 MCG/DAY IUD 1 each by Intrauterine route once. 05/29/17    [provider]  sertraline (ZOLOFT) 100 MG tablet Take 1 tablet (100 mg total) by mouth daily. 05/24/23   Charlton Amor, DO      Allergies    Penicillins, Sulfa antibiotics, and Sulfasalazine    Review of Systems   Review of Systems  Constitutional:  Negative for diaphoresis and fever.  Respiratory:  Negative for cough, chest tightness, shortness of breath and wheezing.   Cardiovascular:  Positive for chest pain.  Neurological:  Negative for dizziness and light-headedness.    Physical Exam Updated Vital Signs BP 116/81   Pulse 72   Temp 98.3 F (36.8 C) (Oral)   Resp (!) 24   Ht 5\' 2"  (1.575 m)   Wt 74.8 kg   SpO2 100%   BMI 30.18 kg/m  Physical Exam Constitutional:      Appearance: She is not ill-appearing.     Comments: Seems uncomfortable   Neck:     Vascular: No JVD.  Cardiovascular:     Rate and Rhythm: Normal rate and regular rhythm.     Pulses:          Carotid pulses are 2+ on the right side and 2+ on the left side.      Radial pulses are 2+ on the right side and 2+ on the left side.       Dorsalis pedis pulses are 2+ on the right side and 2+ on the left side.  Posterior tibial pulses are 2+ on the right side and 2+ on the left side.     Heart sounds: Normal heart sounds.  Pulmonary:     Effort: Pulmonary effort is normal. No accessory muscle usage.     Breath sounds: Decreased breath sounds (could be due to patient not taking deep breaths) present.  Chest:     Chest wall: No mass, tenderness, crepitus or edema.  Abdominal:     General: Bowel sounds are normal.     Palpations: Abdomen is soft.     Tenderness: There is no abdominal tenderness.  Musculoskeletal:     Cervical back: Normal range of motion.     Right lower leg: No tenderness. No edema.     Left lower leg: No tenderness. No edema.  Skin:    General: Skin is warm and dry.     Capillary Refill: Capillary refill takes less than 2 seconds.  Neurological:     Mental Status: She is  alert.     ED Results / Procedures / Treatments   Labs (all labs ordered are listed, but only abnormal results are displayed) Labs Reviewed  BASIC METABOLIC PANEL - Abnormal; Notable for the following components:      Result Value   Potassium 3.4 (*)    All other components within normal limits  CBC  PREGNANCY, URINE  D-DIMER, QUANTITATIVE  TROPONIN I (HIGH SENSITIVITY)  TROPONIN I (HIGH SENSITIVITY)    EKG EKG Interpretation Date/Time:  Thursday November 07 2023 10:07:14 EST Ventricular Rate:  78 PR Interval:  182 QRS Duration:  79 QT Interval:  389 QTC Calculation: 444 R Axis:   64  Text Interpretation: Sinus rhythm Probable anteroseptal infarct, old No significant change since last tracing Confirmed by Alvira Monday ([id number]) on 11/07/2023 10:17:58 AM  Radiology DG Chest 2 View  Result Date: 11/07/2023 CLINICAL DATA:  Central chest pain and shortness of breath beginning this morning. EXAM: CHEST - 2 VIEW COMPARISON:  05/02/2021 FINDINGS: The heart size and mediastinal contours are within normal limits. Both lungs are clear. The visualized skeletal structures are unremarkable. IMPRESSION: No active cardiopulmonary disease. Electronically Signed   By: Danae Orleans M.D.   On: 11/07/2023 12:12    Procedures Procedures    Medications Ordered in ED Medications  ipratropium-albuterol (DUONEB) 0.5-2.5 (3) MG/3ML nebulizer solution 3 mL (3 mLs Nebulization Given 11/07/23 1056)  potassium chloride SA (KLOR-CON M) CR tablet 40 mEq (40 mEq Oral Given 11/07/23 1056)  ketorolac (TORADOL) 30 MG/ML injection 30 mg (30 mg Intravenous Given 11/07/23 1207)  alum & mag hydroxide-simeth (MAALOX/MYLANTA) 200-200-20 MG/5ML suspension 30 mL (30 mLs Oral Given 11/07/23 1204)    ED Course/ Medical Decision Making/ A&P Clinical Course as of 11/07/23 2357  Thu Nov 07, 2023  1051 Potassium(!): 3.4 Potassium slightly low, giving 40 meq [AB]  1051 DG Chest 2 View Less likely that patient  had fluid or infection in thorax s/t surgical complications, no obvious pna or effusions  [AB]  1052 Ordering Ddimer given significant pleuritic pain without MSK component, though PERC negative  [AB]  1053 Troponin I (High Sensitivity): <2 Troponin reassuring  [AB]  1118 D-dimer, quantitative D-dimer is negative, unlikely PE  [AB]  1242 Troponin I (High Sensitivity) Repeat troponin negative  [AB]    Clinical Course User Index [AB] Lockie Mola, MD             HEART Score: 1  Medical Decision Making This patient presents to the ED with chief complaint(s) of chest pain with deep breaths with pertinent past medical history of bariatric surgery which further complicates the presenting complaint. The complaint involves an extensive differential diagnosis and also carries with it a high risk of complications and morbidity.    The differential diagnosis includes ACS, esophageal tear, PE, pericarditis, mass, GERD, muscle sprain    Additional history obtained: Records reviewed previous admission documents  ED Course and Reassessment: Patient hemodynamically stable  Tropoinin negative  Wells negative but ordered d-dimer as patient was significant pleuritic pain and appearance, d-dimer negative  CXR without evidence of interstitial pathology  Second troponin negative  No improvement with duoneb  Improved with maalox  Most likely due to GERD that has worsened since loosening sutures  Prescribed protonix on discharge.   Independent labs interpretation:  The following labs were independently interpreted:mild hypokalemia   Independent visualization of imaging: - I independently visualized the following imaging with scope of interpretation limited to determining acute life threatening conditions related to emergency care: chest xray, which revealed no significant cardiopulmonary disease   Consideration for admission or further workup: given ACS workup negative and patient  is hemodynamically stable less concerned for cardiac pathology. Most likely GI related and patient is continuing to have discussions of therapeutic surgical intervention.     Amount and/or Complexity of Data Reviewed Labs: ordered. Decision-making details documented in ED Course. Radiology: ordered. Decision-making details documented in ED Course.  Risk OTC drugs. Prescription drug management.   Final Clinical Impression(s) / ED Diagnoses Final diagnoses:  Atypical chest pain  Pleurisy  Gastroesophageal reflux disease, unspecified whether esophagitis present  History of gastric surgery    Rx / DC Orders ED Discharge Orders          Ordered    pantoprazole (PROTONIX) 40 MG tablet  Daily        11/07/23 1408              Lockie Mola, MD 11/07/23 2357    Alvira Monday, MD 11/12/23 2300

## 2023-12-06 ENCOUNTER — Other Ambulatory Visit: Payer: Self-pay | Admitting: Physician Assistant

## 2023-12-06 ENCOUNTER — Encounter: Payer: Self-pay | Admitting: Physician Assistant

## 2023-12-06 ENCOUNTER — Ambulatory Visit (INDEPENDENT_AMBULATORY_CARE_PROVIDER_SITE_OTHER): Payer: 59 | Admitting: Physician Assistant

## 2023-12-06 ENCOUNTER — Ambulatory Visit (HOSPITAL_BASED_OUTPATIENT_CLINIC_OR_DEPARTMENT_OTHER)
Admission: RE | Admit: 2023-12-06 | Discharge: 2023-12-06 | Disposition: A | Discharge status: Home / Self Care | Payer: 59 | Source: Ambulatory Visit | Attending: Physician Assistant | Admitting: Physician Assistant

## 2023-12-06 VITALS — BP 119/77 | Resp 12 | Ht 62.0 in | Wt 161.4 lb

## 2023-12-06 DIAGNOSIS — Z13220 Encounter for screening for lipoid disorders: Secondary | ICD-10-CM

## 2023-12-06 DIAGNOSIS — Z980 Intestinal bypass and anastomosis status: Secondary | ICD-10-CM | POA: Insufficient documentation

## 2023-12-06 DIAGNOSIS — R079 Chest pain, unspecified: Secondary | ICD-10-CM | POA: Insufficient documentation

## 2023-12-06 DIAGNOSIS — Z1231 Encounter for screening mammogram for malignant neoplasm of breast: Secondary | ICD-10-CM

## 2023-12-06 DIAGNOSIS — Z0000 Encounter for general adult medical examination without abnormal findings: Secondary | ICD-10-CM | POA: Diagnosis not present

## 2023-12-06 DIAGNOSIS — Z903 Acquired absence of stomach [part of]: Secondary | ICD-10-CM

## 2023-12-06 DIAGNOSIS — R0602 Shortness of breath: Secondary | ICD-10-CM | POA: Diagnosis present

## 2023-12-06 DIAGNOSIS — Z131 Encounter for screening for diabetes mellitus: Secondary | ICD-10-CM

## 2023-12-06 DIAGNOSIS — T8149XA Infection following a procedure, other surgical site, initial encounter: Secondary | ICD-10-CM

## 2023-12-06 DIAGNOSIS — N898 Other specified noninflammatory disorders of vagina: Secondary | ICD-10-CM

## 2023-12-06 DIAGNOSIS — N951 Menopausal and female climacteric states: Secondary | ICD-10-CM

## 2023-12-06 DIAGNOSIS — F411 Generalized anxiety disorder: Secondary | ICD-10-CM | POA: Diagnosis not present

## 2023-12-06 DIAGNOSIS — Z01419 Encounter for gynecological examination (general) (routine) without abnormal findings: Secondary | ICD-10-CM

## 2023-12-06 MED ORDER — FLUCONAZOLE 150 MG PO TABS: 150.0000 mg | ORAL_TABLET | Freq: Once | ORAL | 0 refills | Status: AC

## 2023-12-06 MED ORDER — PANTOPRAZOLE SODIUM 40 MG PO TBEC: 40.0000 mg | DELAYED_RELEASE_TABLET | Freq: Every day | ORAL | 0 refills | Status: AC

## 2023-12-06 MED ORDER — IOHEXOL 350 MG/ML SOLN
75.0000 mL | Freq: Once | INTRAVENOUS | Status: AC | PRN
  Administered 2023-12-06: 75 mL via INTRAVENOUS

## 2023-12-06 MED ORDER — DOXYCYCLINE HYCLATE 100 MG PO TABS: 100.0000 mg | ORAL_TABLET | Freq: Two times a day (BID) | ORAL | 0 refills | Status: DC

## 2023-12-06 MED ORDER — SERTRALINE HCL 100 MG PO TABS: 100.0000 mg | ORAL_TABLET | Freq: Every day | ORAL | 1 refills | Status: DC

## 2023-12-06 NOTE — Patient Instructions (Addendum)
Take protonix Start antibiotic for cellulitis  Health Maintenance, Female Adopting a healthy lifestyle and getting preventive care are important in promoting health and wellness. Ask your health care provider about: The right schedule for you to have regular tests and exams. Things you can do on your own to prevent diseases and keep yourself healthy. What should I know about diet, weight, and exercise? Eat a healthy diet  Eat a diet that includes plenty of vegetables, fruits, low-fat dairy products, and lean protein. Do not eat a lot of foods that are high in solid fats, added sugars, or sodium. Maintain a healthy weight Body mass index (BMI) is used to identify weight problems. It estimates body fat based on height and weight. Your health care provider can help determine your BMI and help you achieve or maintain a healthy weight. Get regular exercise Get regular exercise. This is one of the most important things you can do for your health. Most adults should: Exercise for at least 150 minutes each week. The exercise should increase your heart rate and make you sweat (moderate-intensity exercise). Do strengthening exercises at least twice a week. This is in addition to the moderate-intensity exercise. Spend less time sitting. Even light physical activity can be beneficial. Watch cholesterol and blood lipids Have your blood tested for lipids and cholesterol at 42 years of age, then have this test every 5 years. Have your cholesterol levels checked more often if: Your lipid or cholesterol levels are high. You are older than 42 years of age. You are at high risk for heart disease. What should I know about cancer screening? Depending on your health history and family history, you may need to have cancer screening at various ages. This may include screening for: Breast cancer. Cervical cancer. Colorectal cancer. Skin cancer. Lung cancer. What should I know about heart disease, diabetes,  and high blood pressure? Blood pressure and heart disease High blood pressure causes heart disease and increases the risk of stroke. This is more likely to develop in people who have high blood pressure readings or are overweight. Have your blood pressure checked: Every 3-5 years if you are 38-60 years of age. Every year if you are 63 years old or older. Diabetes Have regular diabetes screenings. This checks your fasting blood sugar level. Have the screening done: Once every three years after age 38 if you are at a normal weight and have a low risk for diabetes. More often and at a younger age if you are overweight or have a high risk for diabetes. What should I know about preventing infection? Hepatitis B If you have a higher risk for hepatitis B, you should be screened for this virus. Talk with your health care provider to find out if you are at risk for hepatitis B infection. Hepatitis C Testing is recommended for: Everyone born from [birth year] through 1965. Anyone with known risk factors for hepatitis C. Sexually transmitted infections (STIs) Get screened for STIs, including gonorrhea and chlamydia, if: You are sexually active and are younger than 42 years of age. You are older than 42 years of age and your health care provider tells you that you are at risk for this type of infection. Your sexual activity has changed since you were last screened, and you are at increased risk for chlamydia or gonorrhea. Ask your health care provider if you are at risk. Ask your health care provider about whether you are at high risk for HIV. Your health care provider may recommend  a prescription medicine to help prevent HIV infection. If you choose to take medicine to prevent HIV, you should first get tested for HIV. You should then be tested every 3 months for as long as you are taking the medicine. Pregnancy If you are about to stop having your period (premenopausal) and you may become pregnant, seek  counseling before you get pregnant. Take 400 to 800 micrograms (mcg) of folic acid every day if you become pregnant. Ask for birth control (contraception) if you want to prevent pregnancy. Osteoporosis and menopause Osteoporosis is a disease in which the bones lose minerals and strength with aging. This can result in bone fractures. If you are 51 years old or older, or if you are at risk for osteoporosis and fractures, ask your health care provider if you should: Be screened for bone loss. Take a calcium or vitamin D supplement to lower your risk of fractures. Be given hormone replacement therapy (HRT) to treat symptoms of menopause. Follow these instructions at home: Alcohol use Do not drink alcohol if: Your health care provider tells you not to drink. You are pregnant, may be pregnant, or are planning to become pregnant. If you drink alcohol: Limit how much you have to: 0-1 drink a day. Know how much alcohol is in your drink. In the [place] one drink equals one 12 oz bottle of beer (355 mL), one 5 oz glass of wine (148 mL), or one 1 oz glass of hard liquor (44 mL). Lifestyle Do not use any products that contain nicotine or tobacco. These products include cigarettes, chewing tobacco, and vaping devices, such as e-cigarettes. If you need help quitting, ask your health care provider. Do not use street drugs. Do not share needles. Ask your health care provider for help if you need support or information about quitting drugs. General instructions Schedule regular health, dental, and eye exams. Stay current with your vaccines. Tell your health care provider if: You often feel depressed. You have ever been abused or do not feel safe at home. Summary Adopting a healthy lifestyle and getting preventive care are important in promoting health and wellness. Follow your health care provider's instructions about healthy diet, exercising, and getting tested or screened for diseases. Follow your  health care provider's instructions on monitoring your cholesterol and blood pressure. This information is not intended to replace advice given to you by your health care provider. Make sure you discuss any questions you have with your health care provider. Document Revised: 05/01/2021 Document Reviewed: 05/01/2021 Elsevier Patient Education  2024 [employer].

## 2023-12-06 NOTE — Progress Notes (Signed)
Complete physical exam  Patient: Holly Jackson   DOB: January 24, 1981   42 y.o. Female  MRN: [id number]  Subjective:    Chief Complaint  Patient presents with   Annual Exam    Jawanna Glazier is a 42 y.o. female who presents today for a complete physical exam. She reports consuming a general diet.  Due to recent surgery not exercising but reports to be very active normally.   She generally feels fairly well. She reports sleeping poorly. She does have additional problems to discuss today.   Hx of HPV on pap in the past. No biopsy history.   Pt having some menopausal symptoms and request hormone testing.   Pt had gastric bypass revision 7 days ago. 3 days ago she started having left sided chest pain and SOB. No calf or leg swelling. Hx of GERD causing CP. Not taking protonix.  Tenderness in right lower quadrant around incision with redness and warmth.    Most recent fall risk assessment:    12/06/2023    3:03 PM  Fall Risk   Falls in the past year? 0  Number falls in past yr: 1  Injury with Fall? 0     Most recent depression screenings:    12/06/2023    3:02 PM 05/24/2023    9:31 AM  PHQ 2/9 Scores  PHQ - 2 Score 0 0  PHQ- 9 Score 5 5    Vision:Within last year and Dental: No current dental problems and Receives regular dental care  Patient Active Problem List   Diagnosis Date Noted   Menopausal symptoms 12/11/2023   Left-sided chest pain 12/06/2023   Current moderate episode of major depressive disorder without prior episode (HCC) 05/24/2023   Bacterial conjunctivitis 04/09/2023   Generalized abdominal pain 04/09/2023   Excessive weight loss 04/09/2023   Patellofemoral arthritis of right knee 12/28/2022   Right wrist sprain 12/28/2022   Routine adult health maintenance 09/03/2022   Right hip pain 09/03/2022   History of cold sores 09/28/2020   Lumbar degenerative disc disease 12/16/2019   Normal postpartum course 06/21/2019   Alteration in comfort associated with  postoperative pain 06/21/2019   Incidental lung nodule, > 3mm and < 8mm 06/21/2019   Encounter for maternal care for low transverse scar from previous cesarean delivery 06/18/2019   Cesarean delivery delivered 06/18/2019   Single umbilical artery 06/02/2019   Gestational diabetes 06/02/2019   Infertility, female 06/02/2019   History of cholestasis during pregnancy--2015 pregnancy 06/02/2019   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, empty 2nd sac at 9 weeks 06/02/2019   Mild persistent asthma 05/23/2017   Atelectasis of left lung 03/07/2017   Allergy to penicillin - causes SOB; mild resp distress 03/01/2017   Allergy to sulfa drugs - mod-severe rash 03/01/2017   History of depression 03/01/2017   AMA (advanced maternal age) multigravida 35+, third trimester 03/01/2017   Velamentous insertion of umbilical cord 03/01/2017   Previous cesarean delivery, antepartum condition or complication 09/22/2014   Obesity (BMI 30.0-34.9) 09/17/2014   GAD (generalized anxiety disorder) 09/17/2014   Hx of abdominoplasty 09/17/2014   Past Medical History:  Diagnosis Date   Anxiety    Asthma    Cholestasis of pregnancy    Depression    Gestational diabetes    IBS (irritable bowel syndrome)    Newborn product of in vitro fertilization (IVF) pregnancy    Vaginal Pap smear, abnormal    pos HPV   Velamentous insertion of umbilical cord  Past Surgical History:  Procedure Laterality Date   ABDOMINOPLASTY     CESAREAN SECTION N/A 09/21/2014   Procedure: CESAREAN SECTION;  Surgeon: Konrad Felix, MD;  Location: [facility];  Service: Obstetrics;  Laterality: N/A;   CESAREAN SECTION N/A 03/04/2017   Procedure: CESAREAN SECTION;  Surgeon: Silverio Lay, MD;  Location: [facility]IR[facility];  Service: Obstetrics;  Laterality: N/A;   CESAREAN SECTION N/A 06/18/2019   Procedure: Repeat CESAREAN SECTION;  Surgeon: Silverio Lay, MD;  Location: [facility] ORS;  Service: Obstetrics;  Laterality: N/A;   Nigel Bridgeman Assisting   Family History  Problem Relation Age of Onset   Miscarriages / Stillbirths Sister    Stroke Mother    Stroke Father    Cerebral aneurysm Father    Allergic rhinitis Neg Hx    Angioedema Neg Hx    Asthma Neg Hx    Eczema Neg Hx    Immunodeficiency Neg Hx    Urticaria Neg Hx    Allergies  Allergen Reactions   Penicillins Shortness Of Breath    Has patient had a PCN reaction causing immediate rash, facial/tongue/throat swelling, SOB or lightheadedness with hypotension: Yes Has patient had a PCN reaction causing severe rash involving mucus membranes or skin necrosis: No Has patient had a PCN reaction that required hospitalization No Has patient had a PCN reaction occurring within the last 10 years: No If all of the above answers are "NO", then may proceed with Cephalosporin use.     Sulfa Antibiotics Rash   Sulfasalazine Rash      Patient Care Team: Charlton Amor, DO as PCP - General (Family Medicine)   Outpatient Medications Prior to Visit  Medication Sig   albuterol (VENTOLIN HFA) 108 (90 Base) MCG/ACT inhaler Inhale 2 puffs into the lungs every 6 (six) hours as needed for wheezing.   Doxylamine Succinate, Sleep, (UNISOM PO) Take 1 tablet by mouth as needed. [employer] brand   folic acid (FOLVITE) 1 MG tablet Take 1 tablet (1 mg total) by mouth daily.   Iron-FA-B Cmp-C-Biot-Probiotic (FUSION PLUS) CAPS Take 1 capsule by mouth every morning.   levonorgestrel (MIRENA, 52 MG,) 20 MCG/DAY IUD 1 each by Intrauterine route once.   [DISCONTINUED] pantoprazole (PROTONIX) 40 MG tablet Take 1 tablet (40 mg total) by mouth daily.   [DISCONTINUED] sertraline (ZOLOFT) 100 MG tablet Take 1 tablet (100 mg total) by mouth daily.   No facility-administered medications prior to visit.    ROS  See HPI.       Objective:     BP 119/77 (BP Location: Right Arm, Patient Position: Sitting)   Resp 12   Ht 5\' 2"  (1.575 m)   Wt 161 lb 6.4 oz (73.2 kg)   SpO2 99%    BMI 29.52 kg/m  BP Readings from Last 3 Encounters:  12/06/23 119/77  11/07/23 116/81  09/13/23 112/76   Wt Readings from Last 3 Encounters:  12/06/23 161 lb 6.4 oz (73.2 kg)  11/07/23 165 lb (74.8 kg)  09/13/23 163 lb (73.9 kg)      Physical Exam  BP 119/77 (BP Location: Right Arm, Patient Position: Sitting)   Resp 12   Ht 5\' 2"  (1.575 m)   Wt 161 lb 6.4 oz (73.2 kg)   SpO2 99%   BMI 29.52 kg/m   General Appearance:    Alert, cooperative, no distress, appears stated age  Head:    Normocephalic, without obvious abnormality, atraumatic  Eyes:    PERRL, conjunctiva/corneas clear,  EOM's intact, fundi    benign, both eyes  Ears:    Normal TM's and external ear canals, both ears  Nose:   Nares normal, septum midline, mucosa normal, no drainage    or sinus tenderness  Throat:   Lips, mucosa, and tongue normal; teeth and gums normal  Neck:   Supple, symmetrical, trachea midline, no adenopathy;    thyroid:  no enlargement/tenderness/nodules; no carotid   bruit or JVD  Back:     Symmetric, no curvature, ROM normal, no CVA tenderness  Lungs:     Clear to auscultation bilaterally, respirations unlabored  Chest Wall:    No tenderness or deformity   Heart:    Regular rate and rhythm, S1 and S2 normal, no murmur, rub   or gallop  Breast Exam:    No tenderness, masses, or nipple abnormality  Abdomen:     Soft, tender abdomen with 4 2cm incisions with right lower quadrant incision with surrounding erythema, warmth, tenderness, bowel sounds active all four quadrants,    no masses, no organomegaly  Genitalia:    Normal female without lesion, discharge or tenderness  Rectal:    Normal tone,no masses or tenderness;  Extremities:   Extremities normal, atraumatic, no cyanosis or edema, no calf pain, swelling or tenderness  Pulses:   2+ and symmetric all extremities  Skin:   Skin color, texture, turgor normal, no rashes or lesions  Lymph nodes:   Cervical, supraclavicular, and axillary nodes  normal  Neurologic:   CNII-XII intact, normal strength, sensation and reflexes    throughout      Assessment & Plan:    Routine Health Maintenance and Physical Exam  Immunization History  Administered Date(s) Administered   HPV Quadrivalent 07/24/2009   Influenza Split 12/09/2012   Influenza, Seasonal, Injecte, Preservative Fre 11/27/2016   Influenza,inj,Quad PF,6+ Mos 09/09/2019, 09/26/2020, 11/02/2021   Influenza,inj,quad, With Preservative 10/24/2018   Influenza-Unspecified 09/23/2013, 10/24/2017, 09/29/2018   PFIZER(Purple Top)SARS-COV-2 Vaccination 02/24/2020, 04/01/2020, 11/12/2020   Pneumococcal Polysaccharide-23 06/13/2017   Tdap 12/12/2016, 03/24/2019    Health Maintenance  Topic Date Due   MAMMOGRAM  Never done   Hepatitis C Screening  Never done   HPV VACCINES (2 - 3-dose SCDM series) 08/21/2009   COVID-19 Vaccine (4 - 2024-25 season) 12/22/2023 (Originally 08/25/2023)   INFLUENZA VACCINE  03/23/2024 (Originally 07/25/2023)   Cervical Cancer Screening (HPV/Pap Cotest)  09/26/2025   DTaP/Tdap/Td (3 - Td or Tdap) 03/23/2029   HIV Screening  Completed    Discussed health benefits of physical activity, and encouraged her to engage in regular exercise appropriate for her age and condition.  Marland KitchenDewayne Hatch was seen today for annual exam.  Diagnoses and all orders for this visit:  Routine adult health maintenance  GAD (generalized anxiety disorder) -     sertraline (ZOLOFT) 100 MG tablet; Take 1 tablet (100 mg total) by mouth daily.  Left-sided chest pain -     pantoprazole (PROTONIX) 40 MG tablet; Take 1 tablet (40 mg total) by mouth daily. -     CT Angio Chest W/Cm &/Or Wo Cm; Future  Shortness of breath -     CT Angio Chest W/Cm &/Or Wo Cm; Future  S/P total gastrectomy and Roux-en-Y esophagojejunal anastomosis -     Lipid panel -     CMP14+EGFR -     TSH -     CBC w/Diff/Platelet -     CT Angio Chest W/Cm &/Or Wo Cm; Future  Screening for diabetes mellitus -  CMP14+EGFR  Screening for lipid disorders -     Lipid panel  Pap smear, as part of routine gynecological examination -     Cytology - PAP  Vaginal discharge -     fluconazole (DIFLUCAN) 150 MG tablet; Take 1 tablet (150 mg total) by mouth once for 1 dose.  Incisional infection -     doxycycline (VIBRA-TABS) 100 MG tablet; Take 1 tablet (100 mg total) by mouth 2 (two) times daily.  Visit for screening mammogram -     MM 3D SCREENING MAMMOGRAM BILATERAL BREAST; Future  Menopausal symptoms  Other orders -     Luteinizing hormone -     Follicle stimulating hormone -     Testosterone -     Estrogens, total -     Progesterone -     Specimen status report   .Marland Kitchen Discussed 150 minutes of exercise a week.  Encouraged vitamin D 1000 units and Calcium 1300mg  or 4 servings of dairy a day.  Fasting labs ordered.  PHQ/GAD no concerns Pap done today STD testing ordered  Diflucan given for appearance of yeast on pap smear Mammogram ordered  Discussed sleep routine and trial of magnesium glycinate at bedtime  Hormone testing ordered.   Right lower abdomen incision concerning for infection sent doxycycline for 10 days.   Concern for PE due to left sided chest pain, SOB, recent surgery STAT CTA ordered to rule out PE Make sure talking protonix for GERD  Declined flu and covid vaccine.       Tandy Gaw, PA-C

## 2023-12-07 ENCOUNTER — Encounter: Payer: Self-pay | Admitting: Physician Assistant

## 2023-12-07 LAB — CBC WITH DIFFERENTIAL/PLATELET
Basophils Absolute: 0 10*3/uL (ref 0.0–0.2)
Basos: 0 %
EOS (ABSOLUTE): 0.2 10*3/uL (ref 0.0–0.4)
Eos: 3 %
Hematocrit: 40.9 % (ref 34.0–46.6)
Hemoglobin: 13.2 g/dL (ref 11.1–15.9)
Immature Grans (Abs): 0 10*3/uL (ref 0.0–0.1)
Immature Granulocytes: 0 %
Lymphocytes Absolute: 1.6 10*3/uL (ref 0.7–3.1)
Lymphs: 17 %
MCH: 26.3 pg — ABNORMAL LOW (ref 26.6–33.0)
MCHC: 32.3 g/dL (ref 31.5–35.7)
MCV: 82 fL (ref 79–97)
Monocytes Absolute: 0.7 10*3/uL (ref 0.1–0.9)
Monocytes: 7 %
Neutrophils Absolute: 6.8 10*3/uL (ref 1.4–7.0)
Neutrophils: 73 %
Platelets: 392 10*3/uL (ref 150–450)
RBC: 5.02 x10E6/uL (ref 3.77–5.28)
RDW: 13 % (ref 11.7–15.4)
WBC: 9.4 10*3/uL (ref 3.4–10.8)

## 2023-12-07 LAB — LIPID PANEL
Chol/HDL Ratio: 4.7 {ratio} — ABNORMAL HIGH (ref 0.0–4.4)
Cholesterol, Total: 160 mg/dL (ref 100–199)
HDL: 34 mg/dL — ABNORMAL LOW (ref >39)
LDL Chol Calc (NIH): 105 mg/dL — ABNORMAL HIGH (ref 0–99)
Triglycerides: 113 mg/dL (ref 0–149)
VLDL Cholesterol Cal: 21 mg/dL (ref 5–40)

## 2023-12-07 LAB — TSH: TSH: 1.56 u[IU]/mL (ref 0.450–4.500)

## 2023-12-07 LAB — CMP14+EGFR
ALT: 30 [IU]/L (ref 0–32)
AST: 24 [IU]/L (ref 0–40)
Albumin: 4.9 g/dL (ref 3.9–4.9)
Alkaline Phosphatase: 88 [IU]/L (ref 44–121)
BUN/Creatinine Ratio: 15 (ref 9–23)
BUN: 10 mg/dL (ref 6–24)
Bilirubin Total: 0.4 mg/dL (ref 0.0–1.2)
CO2: 19 mmol/L — ABNORMAL LOW (ref 20–29)
Calcium: 9.6 mg/dL (ref 8.7–10.2)
Chloride: 100 mmol/L (ref 96–106)
Creatinine, Ser: 0.66 mg/dL (ref 0.57–1.00)
Globulin, Total: 3 g/dL (ref 1.5–4.5)
Glucose: 74 mg/dL (ref 70–99)
Potassium: 4 mmol/L (ref 3.5–5.2)
Sodium: 139 mmol/L (ref 134–144)
Total Protein: 7.9 g/dL (ref 6.0–8.5)
eGFR: 112 mL/min/{1.73_m2} (ref >59)

## 2023-12-09 ENCOUNTER — Encounter: Payer: Self-pay | Admitting: Physician Assistant

## 2023-12-09 NOTE — Progress Notes (Signed)
Holly Jackson,   Thyroid looks good.  Normal hemoglobin.  Kidney, liver, glucose look great!  10 year cardiovascular risk is low.  Increase exercise and good fats to increase HDL.  Decrease processed foods to help with LDL.   Marland Kitchen.The 10-year ASCVD risk score (Arnett DK, et al., 2019) is: 1.2%   Values used to calculate the score:     Age: 42 years     Sex: Female     Is Non-Hispanic African American: Yes     Diabetic: No     Tobacco smoker: No     Systolic Blood Pressure: 119 mmHg     Is BP treated: No     HDL Cholesterol: 34 mg/dL     Total Cholesterol: 160 mg/dL

## 2023-12-09 NOTE — Progress Notes (Signed)
No PE. How is patient feeling today? Is she still having same symptoms?

## 2023-12-11 ENCOUNTER — Ambulatory Visit: Payer: No Typology Code available for payment source

## 2023-12-11 DIAGNOSIS — N951 Menopausal and female climacteric states: Secondary | ICD-10-CM | POA: Insufficient documentation

## 2023-12-11 DIAGNOSIS — Z1231 Encounter for screening mammogram for malignant neoplasm of breast: Secondary | ICD-10-CM

## 2023-12-11 NOTE — Progress Notes (Signed)
Hormone panel was added to labs:   Testosterone a little low. Estrogen pending.  Progesterone looks good.

## 2023-12-13 NOTE — Progress Notes (Signed)
Please call patient. Normal mammogram.  Repeat in 1 year.  

## 2023-12-17 ENCOUNTER — Encounter: Payer: Self-pay | Admitting: Physician Assistant

## 2023-12-17 LAB — IGP,CTNGTV,APT HPV
Chlamydia, Nuc. Acid Amp: NEGATIVE
Gonococcus, Nuc. Acid Amp: NEGATIVE
HPV Aptima: NEGATIVE
Trich vag by NAA: NEGATIVE

## 2023-12-17 LAB — HSV NAA
HSV 1 NAA: NEGATIVE
HSV 2 NAA: NEGATIVE

## 2023-12-20 NOTE — Progress Notes (Signed)
No abnormal cells, HPV or STI's. Follow up pap in 5 years.

## 2024-01-01 LAB — TESTOSTERONE: Testosterone: 4 ng/dL (ref 4–50)

## 2024-01-01 LAB — SPECIMEN STATUS REPORT

## 2024-01-01 LAB — ESTROGENS, TOTAL: Estrogen: 142 pg/mL

## 2024-01-01 LAB — LUTEINIZING HORMONE: LH: 7.2 m[IU]/mL

## 2024-01-01 LAB — PROGESTERONE: Progesterone: 7.8 ng/mL

## 2024-01-01 LAB — FOLLICLE STIMULATING HORMONE: FSH: 2.5 m[IU]/mL

## 2024-01-09 ENCOUNTER — Other Ambulatory Visit: Payer: Self-pay | Admitting: Family

## 2024-01-09 DIAGNOSIS — D560 Alpha thalassemia: Secondary | ICD-10-CM

## 2024-01-10 ENCOUNTER — Other Ambulatory Visit: Payer: Self-pay | Admitting: Family

## 2024-03-13 ENCOUNTER — Inpatient Hospital Stay: Payer: No Typology Code available for payment source | Admitting: Family

## 2024-03-13 ENCOUNTER — Inpatient Hospital Stay: Payer: No Typology Code available for payment source | Attending: Family Medicine

## 2024-04-17 ENCOUNTER — Encounter: Payer: Self-pay | Admitting: Physician Assistant

## 2024-04-17 ENCOUNTER — Ambulatory Visit: Admitting: Physician Assistant

## 2024-04-17 ENCOUNTER — Ambulatory Visit (INDEPENDENT_AMBULATORY_CARE_PROVIDER_SITE_OTHER): Admitting: Physician Assistant

## 2024-04-17 VITALS — BP 122/70 | HR 75 | Ht 62.0 in | Wt 141.0 lb

## 2024-04-17 DIAGNOSIS — R4189 Other symptoms and signs involving cognitive functions and awareness: Secondary | ICD-10-CM

## 2024-04-17 DIAGNOSIS — H579 Unspecified disorder of eye and adnexa: Secondary | ICD-10-CM

## 2024-04-17 DIAGNOSIS — F411 Generalized anxiety disorder: Secondary | ICD-10-CM | POA: Diagnosis not present

## 2024-04-17 DIAGNOSIS — F332 Major depressive disorder, recurrent severe without psychotic features: Secondary | ICD-10-CM | POA: Diagnosis not present

## 2024-04-17 DIAGNOSIS — M791 Myalgia, unspecified site: Secondary | ICD-10-CM | POA: Diagnosis not present

## 2024-04-17 DIAGNOSIS — Z903 Acquired absence of stomach [part of]: Secondary | ICD-10-CM

## 2024-04-17 DIAGNOSIS — R5382 Chronic fatigue, unspecified: Secondary | ICD-10-CM

## 2024-04-17 MED ORDER — AZELASTINE HCL 0.05 % OP SOLN
2.0000 [drp] | Freq: Two times a day (BID) | OPHTHALMIC | 1 refills | Status: AC
Start: 2024-04-17 — End: ?

## 2024-04-17 MED ORDER — SERTRALINE HCL 100 MG PO TABS: 100.0000 mg | ORAL_TABLET | Freq: Every day | ORAL | 1 refills | Status: DC

## 2024-04-17 MED ORDER — DULOXETINE HCL 30 MG PO CPEP: 30.0000 mg | ORAL_CAPSULE | Freq: Every day | ORAL | 0 refills | Status: DC

## 2024-04-17 NOTE — Patient Instructions (Signed)
 Start optivar  twice a day and let me know if left eye redness not improving  Start cymbalta for mood and pain and cognition   Get labs today.

## 2024-04-17 NOTE — Progress Notes (Signed)
 Established Patient Office Visit  Subjective   Patient ID: Holly Jackson, female    DOB: [birth year]  Age: 43 y.o. MRN: [id number]  Chief Complaint  Patient presents with   Medical Management of Chronic Issues    Chronic fatigue Back neck and shoulder blade pain- Sertraline  effectiveness evaluation    HPI Pt is a 43 yo female with hx of endoscopic sleeve gastrectomy in jan 2024 and then gastric diversion in 12/24 who presents to the clinic for ongoing fatigue for the last year. She is so fatigued she has had to stop working and will lose insurance in 1 month. She does have some depression symptoms. She does not feel like doing anything. She does not exercise. She sleeps often and does not feel rested. She has ongoing muscle pains that are constant. She denies any injuries. Massages do help. She is wondering if zoloft  is really helping her.  .. Active Ambulatory Problems    Diagnosis Date Noted   Allergy to penicillin - causes SOB; mild resp distress 03/01/2017   Allergy to sulfa drugs - mod-severe rash 03/01/2017   Obesity (BMI 30.0-34.9) 09/17/2014   GAD (generalized anxiety disorder) 09/17/2014   Hx of abdominoplasty 09/17/2014   Previous cesarean delivery, antepartum condition or complication 09/22/2014   History of depression 03/01/2017   AMA (advanced maternal age) multigravida 35+, third trimester 03/01/2017   Velamentous insertion of umbilical cord 03/01/2017   Atelectasis of left lung 03/07/2017   Mild persistent asthma 05/23/2017   Single umbilical artery 06/02/2019   Gestational diabetes 06/02/2019   Infertility, female 06/02/2019   History of cholestasis during pregnancy--2015 pregnancy 06/02/2019   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, empty 2nd sac at 9 weeks 06/02/2019   Encounter for maternal care for low transverse scar from previous cesarean delivery 06/18/2019   Cesarean delivery delivered 06/18/2019   Normal postpartum course 06/21/2019    Alteration in comfort associated with postoperative pain 06/21/2019   Incidental lung nodule, > 3mm and < 8mm 06/21/2019   Lumbar degenerative disc disease 12/16/2019   History of cold sores 09/28/2020   Routine adult health maintenance 09/03/2022   Right hip pain 09/03/2022   Patellofemoral arthritis of right knee 12/28/2022   Right wrist sprain 12/28/2022   Bacterial conjunctivitis 04/09/2023   Generalized abdominal pain 04/09/2023   Excessive weight loss 04/09/2023   Current moderate episode of major depressive disorder without prior episode (HCC) 05/24/2023   Left-sided chest pain 12/06/2023   Menopausal symptoms 12/11/2023   Myalgia 04/17/2024   Brain fog 04/17/2024   Chronic fatigue 04/17/2024   Status post gastrectomy 04/17/2024   Resolved Ambulatory Problems    Diagnosis Date Noted   Cholestasis of pregnancy 09/17/2014   GBS (group B Streptococcus carrier), +RV culture, currently pregnant 09/17/2014   Samuel Crock breech presentation 03/01/2017   Placenta succenturiate lobe affecting fetus 03/01/2017   Status post repeat low transverse cesarean section 03/04/2017   Dermatitis 05/23/2017   Recurrent infections 05/23/2017   Perennial and seasonal allergic rhinitis 05/23/2017   Past Medical History:  Diagnosis Date   Anxiety    Asthma    Depression    IBS (irritable bowel syndrome)    Newborn product of in vitro fertilization (IVF) pregnancy    Vaginal Pap smear, abnormal        ROS See HPI.    Objective:     BP 122/70   Pulse 75   Ht 5\' 2"  (1.575 m)   Wt 141 lb (  64 kg)   SpO2 99%   BMI 25.79 kg/m  BP Readings from Last 3 Encounters:  04/17/24 122/70  12/06/23 119/77  11/07/23 116/81   Wt Readings from Last 3 Encounters:  04/17/24 141 lb (64 kg)  12/06/23 161 lb 6.4 oz (73.2 kg)  11/07/23 165 lb (74.8 kg)    ..    04/17/2024    9:12 AM 12/06/2023    3:02 PM 05/24/2023    9:31 AM 04/09/2023    2:30 PM 01/17/2022    3:03 PM  Depression screen PHQ 2/9   Decreased Interest 3 0 0 2 0  Down, Depressed, Hopeless 2 0 0 2 0  PHQ - 2 Score 5 0 0 4 0  Altered sleeping 3 2 3     Tired, decreased energy 3 2 2     Change in appetite 3 1 0    Feeling bad or failure about yourself  1 0 0    Trouble concentrating 3 0 0    Moving slowly or fidgety/restless 0 0 0    Suicidal thoughts 0 0 0    PHQ-9 Score 18 5 5     Difficult doing work/chores Extremely dIfficult  Not difficult at all     .Aaron Aas    04/17/2024    9:13 AM 12/06/2023    3:03 PM 05/24/2023    9:33 AM 01/17/2022    3:05 PM  GAD 7 : Generalized Anxiety Score  Nervous, Anxious, on Edge 2 0 0 0  Control/stop worrying 1 0 0 0  Worry too much - different things 3 0 0 0  Trouble relaxing 0 1 2 1   Restless 0 0 0 0  Easily annoyed or irritable 3 0 2 0  Afraid - awful might happen 3 0 0 0  Total GAD 7 Score 12 1 4 1   Anxiety Difficulty Extremely difficult  Somewhat difficult Not difficult at all      Physical Exam Constitutional:      Appearance: Normal appearance.  HENT:     Head: Normocephalic.     Mouth/Throat:     Mouth: Mucous membranes are moist.  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   Cardiovascular:     Rate and Rhythm: Normal rate and regular rhythm.  Pulmonary:     Effort: Pulmonary effort is normal.     Breath sounds: Normal breath sounds.  Musculoskeletal:     Cervical back: Normal range of motion and neck supple. No tenderness.  Lymphadenopathy:     Cervical: No cervical adenopathy.  Neurological:     General: No focal deficit present.     Mental Status: She is alert and oriented to person, place, and time.  Psychiatric:        Mood and Affect: Mood normal.      The 10-year ASCVD risk score (Arnett DK, et al., 2019) is: 1.3%    Assessment & Plan:  Aaron AasAaron AasKaleesha was seen today for medical management of chronic issues.  Diagnoses and all orders for this visit:  Chronic fatigue -     TSH + free T4 -     B12 and Folate Panel -      VITAMIN D 25 Hydroxy (Vit-D Deficiency, Fractures) -     CBC w/Diff/Platelet -     CMP14+EGFR -     Fe+TIBC+Fer  GAD (generalized anxiety disorder) -     TSH + free T4 -     B12 and  Folate Panel -     VITAMIN D 25 Hydroxy (Vit-D Deficiency, Fractures) -     CBC w/Diff/Platelet -     CMP14+EGFR -     Fe+TIBC+Fer -     sertraline  (ZOLOFT ) 100 MG tablet; Take 1 tablet (100 mg total) by mouth daily.  Severe episode of recurrent major depressive disorder, without psychotic features (HCC) -     TSH + free T4 -     B12 and Folate Panel -     VITAMIN D 25 Hydroxy (Vit-D Deficiency, Fractures) -     CBC w/Diff/Platelet -     CMP14+EGFR -     Fe+TIBC+Fer -     DULoxetine (CYMBALTA) 30 MG capsule; Take 1 capsule (30 mg total) by mouth daily.  Myalgia -     TSH + free T4 -     B12 and Folate Panel -     VITAMIN D 25 Hydroxy (Vit-D Deficiency, Fractures) -     CBC w/Diff/Platelet -     CMP14+EGFR -     Fe+TIBC+Fer -     DULoxetine (CYMBALTA) 30 MG capsule; Take 1 capsule (30 mg total) by mouth daily.  Brain fog -     TSH + free T4 -     B12 and Folate Panel -     VITAMIN D 25 Hydroxy (Vit-D Deficiency, Fractures) -     CBC w/Diff/Platelet -     CMP14+EGFR -     Fe+TIBC+Fer -     DULoxetine (CYMBALTA) 30 MG capsule; Take 1 capsule (30 mg total) by mouth daily.  Status post gastrectomy -     TSH + free T4 -     B12 and Folate Panel -     VITAMIN D 25 Hydroxy (Vit-D Deficiency, Fractures) -     CBC w/Diff/Platelet -     CMP14+EGFR -     Fe+TIBC+Fer  Injected eye, left -     azelastine  (OPTIVAR ) 0.05 % ophthalmic solution; Place 2 drops into both eyes 2 (two) times daily.   Chronic fatigue for last year since first bariatric surgery Will get labs today look for metabolic causes for fatigue and will treat accordingly PHQ/GAD not to goal Start cymbalta  Discussed side effects Continue on zoloft  Follow up in 4-6 weeks  Sandy Crumb, PA-C

## 2024-04-18 LAB — CMP14+EGFR
ALT: 12 IU/L (ref 0–32)
AST: 19 IU/L (ref 0–40)
Albumin: 4.7 g/dL (ref 3.9–4.9)
Alkaline Phosphatase: 98 IU/L (ref 44–121)
BUN/Creatinine Ratio: 18 (ref 9–23)
BUN: 11 mg/dL (ref 6–24)
Bilirubin Total: 0.4 mg/dL (ref 0.0–1.2)
CO2: 22 mmol/L (ref 20–29)
Calcium: 9.5 mg/dL (ref 8.7–10.2)
Chloride: 102 mmol/L (ref 96–106)
Creatinine, Ser: 0.62 mg/dL (ref 0.57–1.00)
Globulin, Total: 2.5 g/dL (ref 1.5–4.5)
Glucose: 91 mg/dL (ref 70–99)
Potassium: 3.9 mmol/L (ref 3.5–5.2)
Sodium: 141 mmol/L (ref 134–144)
Total Protein: 7.2 g/dL (ref 6.0–8.5)
eGFR: 114 mL/min/{1.73_m2} (ref >59)

## 2024-04-18 LAB — IRON,TIBC AND FERRITIN PANEL
Ferritin: 540 ng/mL — ABNORMAL HIGH (ref 15–150)
Iron Saturation: 32 % (ref 15–55)
Iron: 104 ug/dL (ref 27–159)
Total Iron Binding Capacity: 323 ug/dL (ref 250–450)
UIBC: 219 ug/dL (ref 131–425)

## 2024-04-18 LAB — CBC WITH DIFFERENTIAL/PLATELET
Basophils Absolute: 0 10*3/uL (ref 0.0–0.2)
Basos: 0 %
EOS (ABSOLUTE): 0.1 10*3/uL (ref 0.0–0.4)
Eos: 2 %
Hematocrit: 36.3 % (ref 34.0–46.6)
Hemoglobin: 12 g/dL (ref 11.1–15.9)
Immature Grans (Abs): 0 10*3/uL (ref 0.0–0.1)
Immature Granulocytes: 0 %
Lymphocytes Absolute: 1.7 10*3/uL (ref 0.7–3.1)
Lymphs: 27 %
MCH: 26.8 pg (ref 26.6–33.0)
MCHC: 33.1 g/dL (ref 31.5–35.7)
MCV: 81 fL (ref 79–97)
Monocytes Absolute: 0.4 10*3/uL (ref 0.1–0.9)
Monocytes: 6 %
Neutrophils Absolute: 4.1 10*3/uL (ref 1.4–7.0)
Neutrophils: 65 %
Platelets: 298 10*3/uL (ref 150–450)
RBC: 4.47 x10E6/uL (ref 3.77–5.28)
RDW: 12.3 % (ref 11.7–15.4)
WBC: 6.3 10*3/uL (ref 3.4–10.8)

## 2024-04-18 LAB — TSH+FREE T4
Free T4: 1.18 ng/dL (ref 0.82–1.77)
TSH: 2.09 u[IU]/mL (ref 0.450–4.500)

## 2024-04-18 LAB — B12 AND FOLATE PANEL
Folate: >20 ng/mL (ref >3.0)
Vitamin B-12: 540 pg/mL (ref 232–1245)

## 2024-04-18 LAB — VITAMIN D 25 HYDROXY (VIT D DEFICIENCY, FRACTURES): Vit D, 25-Hydroxy: 38.4 ng/mL (ref 30.0–100.0)

## 2024-04-20 ENCOUNTER — Encounter: Payer: Self-pay | Admitting: Family

## 2024-04-20 ENCOUNTER — Encounter: Payer: Self-pay | Admitting: Physician Assistant

## 2024-04-20 NOTE — Progress Notes (Signed)
 Holly Jackson,   Thyroid  looks great.  B12 and folate are normal.  Vitamin D low normal. You could increase by 1000 units.  Normal hemoglobin.  Kidney, liver, glucose look GREAT.  Ferritin is elevated but does not look to be due to iron overload or any problems with liver so more likely stress and inflammation in the body.  Consider also starting OTC ashwaganda 300mg  twice a day for stress in body.   Follow up in 4 weeks

## 2024-04-27 MED ORDER — CARIPRAZINE HCL 1.5 MG PO CAPS: 1.5000 mg | ORAL_CAPSULE | Freq: Every day | ORAL | 1 refills | Status: AC

## 2024-05-11 MED ORDER — SERTRALINE HCL 100 MG PO TABS: ORAL_TABLET | ORAL | 0 refills | Status: DC

## 2024-05-11 NOTE — Addendum Note (Signed)
 Addended byAraceli Knight on: 05/11/2024 04:00 PM   Modules accepted: Orders

## 2024-08-11 ENCOUNTER — Other Ambulatory Visit: Payer: Self-pay | Admitting: Physician Assistant

## 2024-08-25 ENCOUNTER — Encounter: Payer: Self-pay | Admitting: Sports Medicine

## 2024-08-26 ENCOUNTER — Encounter: Payer: Self-pay | Admitting: Family

## 2024-11-15 ENCOUNTER — Other Ambulatory Visit: Payer: Self-pay | Admitting: Physician Assistant
